# Patient Record
Sex: Female | Born: 1975 | Race: White | Hispanic: No | Smoking: Never smoker
Health system: Southern US, Community
[De-identification: ages and names within clinical notes are randomized; demographics above are authoritative.]

## PROBLEM LIST (undated history)

## (undated) DIAGNOSIS — R002 Palpitations: Secondary | ICD-10-CM

## (undated) DIAGNOSIS — M1990 Unspecified osteoarthritis, unspecified site: Secondary | ICD-10-CM

## (undated) DIAGNOSIS — L509 Urticaria, unspecified: Secondary | ICD-10-CM

## (undated) DIAGNOSIS — I341 Nonrheumatic mitral (valve) prolapse: Secondary | ICD-10-CM

## (undated) DIAGNOSIS — M419 Scoliosis, unspecified: Secondary | ICD-10-CM

## (undated) DIAGNOSIS — R87629 Unspecified abnormal cytological findings in specimens from vagina: Secondary | ICD-10-CM

## (undated) HISTORY — DX: Nonrheumatic mitral (valve) prolapse: I34.1

## (undated) HISTORY — DX: Unspecified abnormal cytological findings in specimens from vagina: R87.629

## (undated) HISTORY — DX: Unspecified osteoarthritis, unspecified site: M19.90

## (undated) HISTORY — DX: Scoliosis, unspecified: M41.9

## (undated) HISTORY — DX: Urticaria, unspecified: L50.9

## (undated) HISTORY — DX: Palpitations: R00.2

---
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
## 1998-01-06 HISTORY — PX: KIDNEY STONE SURGERY: SHX686

## 1998-04-16 ENCOUNTER — Emergency Department (HOSPITAL_COMMUNITY): Admission: EM | Admit: 1998-04-16 | Discharge: 1998-04-16 | Payer: Self-pay | Admitting: Emergency Medicine

## 1998-04-25 ENCOUNTER — Emergency Department (HOSPITAL_COMMUNITY): Admission: EM | Admit: 1998-04-25 | Discharge: 1998-04-25 | Payer: Self-pay | Admitting: Emergency Medicine

## 1999-06-21 ENCOUNTER — Other Ambulatory Visit: Admission: RE | Admit: 1999-06-21 | Discharge: 1999-06-21 | Payer: Self-pay | Admitting: Obstetrics & Gynecology

## 1999-08-12 ENCOUNTER — Other Ambulatory Visit: Admission: RE | Admit: 1999-08-12 | Discharge: 1999-08-12 | Payer: Self-pay | Admitting: Obstetrics & Gynecology

## 1999-08-13 ENCOUNTER — Other Ambulatory Visit: Admission: RE | Admit: 1999-08-13 | Discharge: 1999-08-13 | Payer: Self-pay | Admitting: *Deleted

## 1999-08-13 ENCOUNTER — Encounter (INDEPENDENT_AMBULATORY_CARE_PROVIDER_SITE_OTHER): Payer: Self-pay | Admitting: Specialist

## 2000-04-07 ENCOUNTER — Other Ambulatory Visit: Admission: RE | Admit: 2000-04-07 | Discharge: 2000-04-07 | Payer: Self-pay | Admitting: Obstetrics and Gynecology

## 2000-04-19 ENCOUNTER — Emergency Department (HOSPITAL_COMMUNITY): Admission: EM | Admit: 2000-04-19 | Discharge: 2000-04-19 | Payer: Self-pay | Admitting: Emergency Medicine

## 2000-04-19 ENCOUNTER — Encounter: Payer: Self-pay | Admitting: Emergency Medicine

## 2000-04-21 ENCOUNTER — Inpatient Hospital Stay (HOSPITAL_COMMUNITY): Admission: RE | Admit: 2000-04-21 | Discharge: 2000-04-23 | Payer: Self-pay | Admitting: Urology

## 2000-04-21 ENCOUNTER — Encounter: Payer: Self-pay | Admitting: Urology

## 2000-04-23 ENCOUNTER — Encounter: Payer: Self-pay | Admitting: Urology

## 2000-06-09 ENCOUNTER — Other Ambulatory Visit: Admission: RE | Admit: 2000-06-09 | Discharge: 2000-06-09 | Payer: Self-pay | Admitting: Obstetrics and Gynecology

## 2000-12-22 ENCOUNTER — Ambulatory Visit (HOSPITAL_COMMUNITY): Admission: RE | Admit: 2000-12-22 | Discharge: 2000-12-22 | Payer: Self-pay | Admitting: Urology

## 2000-12-22 ENCOUNTER — Encounter: Payer: Self-pay | Admitting: Urology

## 2001-10-08 ENCOUNTER — Other Ambulatory Visit: Admission: RE | Admit: 2001-10-08 | Discharge: 2001-10-08 | Payer: Self-pay | Admitting: Obstetrics and Gynecology

## 2001-11-15 ENCOUNTER — Encounter: Payer: Self-pay | Admitting: Emergency Medicine

## 2001-11-15 ENCOUNTER — Emergency Department (HOSPITAL_COMMUNITY): Admission: EM | Admit: 2001-11-15 | Discharge: 2001-11-15 | Payer: Self-pay | Admitting: Emergency Medicine

## 2002-12-27 ENCOUNTER — Other Ambulatory Visit: Admission: RE | Admit: 2002-12-27 | Discharge: 2002-12-27 | Payer: Self-pay | Admitting: Obstetrics and Gynecology

## 2004-03-08 ENCOUNTER — Other Ambulatory Visit: Admission: RE | Admit: 2004-03-08 | Discharge: 2004-03-08 | Payer: Self-pay | Admitting: Obstetrics and Gynecology

## 2005-03-28 ENCOUNTER — Other Ambulatory Visit: Admission: RE | Admit: 2005-03-28 | Discharge: 2005-03-28 | Payer: Self-pay | Admitting: Obstetrics and Gynecology

## 2005-10-20 ENCOUNTER — Other Ambulatory Visit: Admission: RE | Admit: 2005-10-20 | Discharge: 2005-10-20 | Payer: Self-pay | Admitting: Obstetrics and Gynecology

## 2006-05-21 ENCOUNTER — Encounter: Admission: RE | Admit: 2006-05-21 | Discharge: 2006-05-21 | Payer: Self-pay | Admitting: Obstetrics and Gynecology

## 2006-05-29 ENCOUNTER — Encounter: Admission: RE | Admit: 2006-05-29 | Discharge: 2006-05-29 | Payer: Self-pay | Admitting: Obstetrics and Gynecology

## 2009-08-27 ENCOUNTER — Ambulatory Visit: Payer: Self-pay | Admitting: Cardiology

## 2010-01-24 ENCOUNTER — Encounter: Payer: Self-pay | Admitting: Cardiology

## 2010-01-24 DIAGNOSIS — I341 Nonrheumatic mitral (valve) prolapse: Secondary | ICD-10-CM | POA: Insufficient documentation

## 2010-01-24 DIAGNOSIS — M419 Scoliosis, unspecified: Secondary | ICD-10-CM | POA: Insufficient documentation

## 2010-01-24 DIAGNOSIS — R002 Palpitations: Secondary | ICD-10-CM | POA: Insufficient documentation

## 2010-03-27 ENCOUNTER — Ambulatory Visit (INDEPENDENT_AMBULATORY_CARE_PROVIDER_SITE_OTHER): Payer: BC Managed Care – PPO | Admitting: Cardiology

## 2010-03-27 ENCOUNTER — Encounter: Payer: Self-pay | Admitting: Cardiology

## 2010-03-27 VITALS — BP 102/74 | HR 64 | Wt 155.0 lb

## 2010-03-27 DIAGNOSIS — I059 Rheumatic mitral valve disease, unspecified: Secondary | ICD-10-CM

## 2010-03-27 DIAGNOSIS — R002 Palpitations: Secondary | ICD-10-CM

## 2010-03-27 DIAGNOSIS — I341 Nonrheumatic mitral (valve) prolapse: Secondary | ICD-10-CM

## 2010-03-27 MED ORDER — METOPROLOL TARTRATE 50 MG PO TABS: 50.0000 mg | ORAL_TABLET | ORAL | Status: DC

## 2010-03-27 NOTE — Progress Notes (Signed)
HPI: No chest pain.  Aerobics i hour 2x a week.  Occ palpitations during extrene exertion.No dizzy spells.  Now on Zyrtec.  No longer taking xanax. OK to take extra metoprolol prn. When she exercises her heart rate gets close to 100 and at rest it is about 60.  She is working out with a Psychologist, educational twice a week.  She has not had any sustained palpitations.  Her energy level is good.  Current Outpatient Prescriptions  Medication Sig Dispense Refill  . ALPRAZolam (XANAX) 0.25 MG tablet Take 0.25 mg by mouth at bedtime as needed.        Marland Kitchen ibuprofen (ADVIL,MOTRIN) 100 MG tablet Take 100 mg by mouth every 6 (six) hours as needed.        . loratadine (CLARITIN) 10 MG tablet Take 10 mg by mouth daily. As needed       . METOPROLOL SUCCINATE PO Take 50 mg by mouth. One-half daily and one-half as needed       . MULTIPLE VITAMIN PO Take by mouth daily.          Allergies no known allergies  Patient Active Problem List  Diagnoses  . Palpitation  . MVP (mitral valve prolapse)  . Scoliosis    History  Smoking status  . Former Smoker  Smokeless tobacco  . Not on file    History  Alcohol Use     No family history on file.  Review of Systems: The patient denies any heat or cold intolerance.  No weight gain or weight loss.  The patient denies headaches or blurry vision.  There is no cough or sputum production.  The patient denies dizziness.  There is no hematuria or hematochezia.  The patient denies any muscle aches or arthritis.  The patient denies any rash.  The patient denies frequent falling or instability.  There is no history of depression or anxiety.  All other systems were reviewed and are negative.   Physical Exam: Vital signs as recorded.Pupils equal and reactive.   Extraocular Movements are full.  There is no scleral icterus.  The mouth and pharynx are normal.  The neck is supple.  The carotids reveal no bruits.  The jugular venous pressure is normal.  The thyroid is not enlarged.  There  is no lymphadenopathy.The chest is clear to percussion and auscultation. There are no rales or rhonchi. Expansion of the chest is symmetrical.The precordium is quiet.  The first heart sound is normal.  The second heart sound is physiologically split.  There is no murmur gallop rub or click.  There is no abnormal lift or heave.The abdomen is soft and nontender. Bowel sounds are normal. The liver and spleen are not enlarged. There Are no abdominal masses. There are no bruits.The pedal pulses are good.  There is no phlebitis or edema.  There is no cyanosis or clubbing.Strength is normal and symmetrical in all extremities.  There is no lateralizing weakness.  There are no sensory deficits.   Assessment / Plan:

## 2010-03-27 NOTE — Assessment & Plan Note (Signed)
No murmur heard on exam today.  No evidence of any congestive heart failure or other manifestationMitral valve disease.

## 2010-03-27 NOTE — Assessment & Plan Note (Signed)
She will continue her present medication which is metoprolol tartrate.  She may take an extra half tablet on a p.r.n. Basis for palpitations.  We will plan to see her back in 6 months for a followup office visit and get fasting lipid panel and chemistries at that time.

## 2010-04-29 ENCOUNTER — Encounter: Payer: Self-pay | Admitting: Cardiology

## 2010-05-06 ENCOUNTER — Telehealth: Payer: Self-pay | Admitting: Cardiology

## 2010-05-06 NOTE — Telephone Encounter (Signed)
Received call from patient asking about statement she received.  Advised copay for 03/27/2010.  She will review records and see if paid, if not she will send in.

## 2010-09-17 ENCOUNTER — Other Ambulatory Visit: Payer: Self-pay | Admitting: Cardiology

## 2010-09-17 DIAGNOSIS — R002 Palpitations: Secondary | ICD-10-CM

## 2010-09-17 DIAGNOSIS — I341 Nonrheumatic mitral (valve) prolapse: Secondary | ICD-10-CM

## 2010-09-17 DIAGNOSIS — E785 Hyperlipidemia, unspecified: Secondary | ICD-10-CM

## 2010-09-20 ENCOUNTER — Ambulatory Visit: Payer: BC Managed Care – PPO | Admitting: Nurse Practitioner

## 2010-09-25 ENCOUNTER — Encounter: Payer: Self-pay | Admitting: Nurse Practitioner

## 2010-09-25 ENCOUNTER — Other Ambulatory Visit (INDEPENDENT_AMBULATORY_CARE_PROVIDER_SITE_OTHER): Payer: BC Managed Care – PPO | Admitting: *Deleted

## 2010-09-25 ENCOUNTER — Ambulatory Visit (INDEPENDENT_AMBULATORY_CARE_PROVIDER_SITE_OTHER): Payer: BC Managed Care – PPO | Admitting: Nurse Practitioner

## 2010-09-25 ENCOUNTER — Telehealth: Payer: Self-pay | Admitting: *Deleted

## 2010-09-25 VITALS — BP 120/80 | HR 66 | Ht 67.5 in | Wt 152.8 lb

## 2010-09-25 DIAGNOSIS — R0602 Shortness of breath: Secondary | ICD-10-CM

## 2010-09-25 DIAGNOSIS — I059 Rheumatic mitral valve disease, unspecified: Secondary | ICD-10-CM

## 2010-09-25 DIAGNOSIS — R002 Palpitations: Secondary | ICD-10-CM

## 2010-09-25 DIAGNOSIS — I341 Nonrheumatic mitral (valve) prolapse: Secondary | ICD-10-CM

## 2010-09-25 DIAGNOSIS — E785 Hyperlipidemia, unspecified: Secondary | ICD-10-CM

## 2010-09-25 LAB — HEPATIC FUNCTION PANEL
ALT: 13 U/L (ref 0–35)
AST: 19 U/L (ref 0–37)
Albumin: 4 g/dL (ref 3.5–5.2)
Alkaline Phosphatase: 39 U/L (ref 39–117)
Bilirubin, Direct: 0.1 mg/dL (ref 0.0–0.3)
Total Bilirubin: 0.8 mg/dL (ref 0.3–1.2)
Total Protein: 6.9 g/dL (ref 6.0–8.3)

## 2010-09-25 LAB — BASIC METABOLIC PANEL
BUN: 9 mg/dL (ref 6–23)
CO2: 26 mEq/L (ref 19–32)
Calcium: 9.2 mg/dL (ref 8.4–10.5)
Chloride: 107 mEq/L (ref 96–112)
Creatinine, Ser: 0.8 mg/dL (ref 0.4–1.2)
GFR: 91.58 mL/min (ref >60.00)
Glucose, Bld: 81 mg/dL (ref 70–99)
Potassium: 4.8 mEq/L (ref 3.5–5.1)
Sodium: 141 mEq/L (ref 135–145)

## 2010-09-25 LAB — LIPID PANEL
Cholesterol: 199 mg/dL (ref 0–200)
HDL: 59.5 mg/dL (ref >39.00)
LDL Cholesterol: 114 mg/dL — ABNORMAL HIGH (ref 0–99)
Total CHOL/HDL Ratio: 3
Triglycerides: 129 mg/dL (ref 0.0–149.0)
VLDL: 25.8 mg/dL (ref 0.0–40.0)

## 2010-09-25 MED ORDER — METOPROLOL TARTRATE 50 MG PO TABS: 50.0000 mg | ORAL_TABLET | Freq: Two times a day (BID) | ORAL | Status: DC

## 2010-09-25 NOTE — Telephone Encounter (Signed)
Message copied by Eugenia Pancoast on Wed Sep 25, 2010  5:16 PM ------      Message from: Cassell Clement      Created: Wed Sep 25, 2010  4:55 PM       Please report.  The liver tests are normal.The triglycerides are normal but the LDL cholesterol is slightly high at 114.  Work harder on diet.  Try to lose weight.  Continue present medicines

## 2010-09-25 NOTE — Telephone Encounter (Signed)
Advised of labs 

## 2010-09-25 NOTE — Patient Instructions (Addendum)
We are going to update your ultrasound of your heart You may increase your metoprolol to two times a day. You may try either a half two times a day or a whole two times a day We will call you with your labs later this week. Use your Ventolin during exercise to see if this helps with your breathing. Use the ibuprofen just as needed, not regularly We will tentatively see you back in 6 months. Call for any problems.

## 2010-09-25 NOTE — Assessment & Plan Note (Signed)
She has a diagnosis of asthma. She is trying several different regimens. I have asked her to use her rescue inhaler during exercise. We will update her echo as well. She is to use less ibuprofen.  Labs are checked today. I think overall, she is fine from our standpoint. We will tentatively see her back in 6 months. Patient is agreeable to this plan and will call if any problems develop in the interim.

## 2010-09-25 NOTE — Assessment & Plan Note (Signed)
We will increase her metoprolol to BID dosing. She may try either a half a tablet or a whole tablet BID at her discretion. We will update her echo.

## 2010-09-25 NOTE — Progress Notes (Signed)
    Mariah Burnett Date of Birth: November 06, 1975   History of Present Illness: Mariah Burnett is seen back today for her 6 month check. She is seen for Dr. Patty Sermons. She has been doing "just ok". She notes that her asthma has flared up. She feels more short of breath. She is worried that she may have some heart involvement. She has been given several inhalers to try. She does get short of breath while trying to exercise. She does not tried her rescue inhaler with exercise. She has had more palpitations and wants to use more metoprolol. She is on the tartrate and takes it only once a day. She did not tolerate the succinate in the remote past. Her last echo was back in 2009. She did not have evidence of MVP at that time with just trace MR. Labs are checked today. She has noted some swelling of her ankles. She tries to watch her salt. She is trying to use less ibuprofen.   Current Outpatient Prescriptions on File Prior to Visit  Medication Sig Dispense Refill  . albuterol (PROVENTIL HFA;VENTOLIN HFA) 108 (90 BASE) MCG/ACT inhaler Inhale 2 puffs into the lungs every 6 (six) hours as needed.        . budesonide-formoterol (SYMBICORT) 80-4.5 MCG/ACT inhaler Inhale 2 puffs into the lungs daily.        . cetirizine (ZYRTEC) 10 MG tablet Take 10 mg by mouth daily.        Marland Kitchen ibuprofen (ADVIL,MOTRIN) 100 MG tablet Take 100 mg by mouth every 6 (six) hours as needed.        Lorita Officer Triphasic (ORTHO TRI-CYCLEN LO) 0.18/0.215/0.25 MG-25 MCG TABS Take by mouth.        . valACYclovir (VALTREX) 500 MG tablet Take 500 mg by mouth as needed.          No Known Allergies  Past Medical History  Diagnosis Date  . Palpitation   . MVP (mitral valve prolapse)     Although no evidence per echo in 2009. Trace MR  . Scoliosis   . Asthma     History reviewed. No pertinent past surgical history.  History  Smoking status  . Former Smoker  Smokeless tobacco  . Not on file    History  Alcohol Use  . Yes   social use    Family History  Problem Relation Age of Onset  . Heart disease Mother   . Heart attack Mother   . Breast cancer Mother     Review of Systems: The review of systems is positive for occasional edema. Some shortness of breath and more palpitations.  All other systems were reviewed and are negative.  Physical Exam: BP 120/80  Pulse 66  Ht 5' 7.5" (1.715 m)  Wt 152 lb 12.8 oz (69.31 kg)  BMI 23.58 kg/m2 Repeat blood pressure by me is fine. Patient is very pleasant and in no acute distress. Skin is warm and dry. Color is normal.  HEENT is unremarkable. Normocephalic/atraumatic. PERRL. Sclera are nonicteric. Neck is supple. No masses. No JVD. Lungs are clear. Cardiac exam shows a regular rate and rhythm. No murmur noted. Abdomen is soft. Extremities are without edema today. Gait and ROM are intact. No gross neurologic deficits noted.  LABORATORY DATA:   Assessment / Plan:

## 2010-10-04 ENCOUNTER — Ambulatory Visit (HOSPITAL_COMMUNITY): Payer: BC Managed Care – PPO | Attending: Cardiology | Admitting: Radiology

## 2010-10-04 DIAGNOSIS — J45909 Unspecified asthma, uncomplicated: Secondary | ICD-10-CM | POA: Insufficient documentation

## 2010-10-04 DIAGNOSIS — R0602 Shortness of breath: Secondary | ICD-10-CM | POA: Insufficient documentation

## 2010-10-04 DIAGNOSIS — I059 Rheumatic mitral valve disease, unspecified: Secondary | ICD-10-CM | POA: Insufficient documentation

## 2010-10-04 DIAGNOSIS — I079 Rheumatic tricuspid valve disease, unspecified: Secondary | ICD-10-CM | POA: Insufficient documentation

## 2010-10-04 DIAGNOSIS — I379 Nonrheumatic pulmonary valve disorder, unspecified: Secondary | ICD-10-CM | POA: Insufficient documentation

## 2010-10-04 DIAGNOSIS — R002 Palpitations: Secondary | ICD-10-CM

## 2010-10-07 ENCOUNTER — Telehealth: Payer: Self-pay | Admitting: *Deleted

## 2010-10-07 NOTE — Telephone Encounter (Signed)
Advised of echo results 

## 2010-10-07 NOTE — Progress Notes (Signed)
Advised patient

## 2010-10-07 NOTE — Telephone Encounter (Signed)
Message copied by Burnell Blanks on Mon Oct 07, 2010 12:41 PM ------      Message from: Cassell Clement      Created: Mon Oct 07, 2010  8:36 AM       Please report.  The echocardiogram is normal.  Her left ventricular function is normal.  The valves look good.

## 2011-08-01 ENCOUNTER — Telehealth: Payer: Self-pay | Admitting: Cardiology

## 2011-08-01 NOTE — Telephone Encounter (Signed)
No note

## 2012-05-20 ENCOUNTER — Encounter: Payer: Self-pay | Admitting: Cardiology

## 2012-10-29 ENCOUNTER — Other Ambulatory Visit: Payer: Self-pay | Admitting: *Deleted

## 2012-10-29 ENCOUNTER — Telehealth: Payer: Self-pay | Admitting: Cardiology

## 2012-10-29 MED ORDER — METOPROLOL TARTRATE 50 MG PO TABS: 25.0000 mg | ORAL_TABLET | Freq: Two times a day (BID) | ORAL | Status: DC

## 2012-10-29 NOTE — Telephone Encounter (Signed)
Medication refilled as requested.

## 2012-10-29 NOTE — Telephone Encounter (Signed)
New Problem      Pt called to see if she can get refills on meds     Thanks!

## 2012-11-08 ENCOUNTER — Ambulatory Visit: Payer: BC Managed Care – PPO | Admitting: Cardiology

## 2012-11-16 ENCOUNTER — Encounter: Payer: Self-pay | Admitting: Cardiology

## 2014-10-03 ENCOUNTER — Other Ambulatory Visit: Payer: Self-pay

## 2014-10-03 DIAGNOSIS — Z1231 Encounter for screening mammogram for malignant neoplasm of breast: Secondary | ICD-10-CM

## 2014-11-07 ENCOUNTER — Ambulatory Visit: Payer: Self-pay

## 2015-01-10 ENCOUNTER — Ambulatory Visit (HOSPITAL_COMMUNITY)
Admission: RE | Admit: 2015-01-10 | Discharge: 2015-01-10 | Disposition: A | Discharge status: Home / Self Care | Payer: BLUE CROSS/BLUE SHIELD | Source: Ambulatory Visit | Attending: Physician Assistant | Admitting: Physician Assistant

## 2015-01-10 ENCOUNTER — Other Ambulatory Visit (HOSPITAL_COMMUNITY): Payer: Self-pay | Admitting: Physician Assistant

## 2015-01-10 DIAGNOSIS — Z1231 Encounter for screening mammogram for malignant neoplasm of breast: Secondary | ICD-10-CM | POA: Diagnosis not present

## 2015-03-14 ENCOUNTER — Other Ambulatory Visit: Payer: Self-pay | Admitting: Advanced Practice Midwife

## 2015-03-28 ENCOUNTER — Other Ambulatory Visit: Payer: Self-pay | Admitting: Advanced Practice Midwife

## 2015-04-05 ENCOUNTER — Other Ambulatory Visit: Payer: Self-pay | Admitting: Advanced Practice Midwife

## 2015-04-05 ENCOUNTER — Other Ambulatory Visit (HOSPITAL_COMMUNITY)
Admission: RE | Admit: 2015-04-05 | Discharge: 2015-04-05 | Disposition: A | Discharge status: Home / Self Care | Payer: BLUE CROSS/BLUE SHIELD | Source: Ambulatory Visit | Attending: Advanced Practice Midwife | Admitting: Advanced Practice Midwife

## 2015-04-05 ENCOUNTER — Encounter: Payer: Self-pay | Admitting: Advanced Practice Midwife

## 2015-04-05 ENCOUNTER — Ambulatory Visit (INDEPENDENT_AMBULATORY_CARE_PROVIDER_SITE_OTHER): Payer: BLUE CROSS/BLUE SHIELD | Admitting: Advanced Practice Midwife

## 2015-04-05 VITALS — BP 100/70 | HR 62 | Ht 66.0 in | Wt 136.5 lb

## 2015-04-05 DIAGNOSIS — Z01419 Encounter for gynecological examination (general) (routine) without abnormal findings: Secondary | ICD-10-CM | POA: Insufficient documentation

## 2015-04-05 DIAGNOSIS — Z1151 Encounter for screening for human papillomavirus (HPV): Secondary | ICD-10-CM | POA: Insufficient documentation

## 2015-04-05 DIAGNOSIS — Z13220 Encounter for screening for lipoid disorders: Secondary | ICD-10-CM

## 2015-04-05 DIAGNOSIS — Z1329 Encounter for screening for other suspected endocrine disorder: Secondary | ICD-10-CM

## 2015-04-05 NOTE — Progress Notes (Signed)
Mariah Burnett 40 y.o.  Filed Vitals:   04/05/15 1434  BP: 100/70  Pulse: 62     Filed Weights   04/05/15 1434  Weight: 136 lb 8 oz (61.916 kg)    Past Medical History: Past Medical History  Diagnosis Date  . Palpitation   . MVP (mitral valve prolapse)     Although no evidence per echo in 2009. Trace MR  . Scoliosis   . Asthma     Past Surgical History: History reviewed. No pertinent past surgical history.  Family History: Family History  Problem Relation Age of Onset  . Heart disease Mother   . Heart attack Mother   . Breast cancer Mother   . Cancer Paternal Grandfather   . Emphysema Paternal Grandfather     Social History: Social History  Substance Use Topics  . Smoking status: Never Smoker   . Smokeless tobacco: Never Used  . Alcohol Use: Yes     Comment: occ    Allergies:  Allergies  Allergen Reactions  . Latex Rash      Current outpatient prescriptions:  .  cetirizine (ZYRTEC) 10 MG tablet, Take 10 mg by mouth daily.  , Disp: , Rfl:  .  metoprolol (LOPRESSOR) 50 MG tablet, Take 25 mg by mouth 2 (two) times daily., Disp: , Rfl:  .  valACYclovir (VALTREX) 500 MG tablet, Take 500 mg by mouth as needed.  , Disp: , Rfl:  .  metoprolol (LOPRESSOR) 50 MG tablet, Take 0.5-1 tablets (25-50 mg total) by mouth 2 (two) times daily., Disp: 60 tablet, Rfl: 0  History of Present Illness: Here for pap and physical.  [place] getting [place] in [place], moved to [place].  Thinks she had an abnormal one 2015.  Had full panel of inherited genetic testing done--all were negative. Has never gotten pregnant.  May want to try.  Hasn't been on BC.    Review of Systems   Patient denies any headaches, blurred vision, shortness of breath, chest pain, abdominal pain, problems with bowel movements, urination, or intercourse.   Physical Exam: General:  Well developed, well nourished, no acute distress Skin:  Warm and dry Neck:  Midline trachea, normal thyroid Lungs;  Clear to auscultation bilaterally Breast:  No dominant palpable mass, retraction, or nipple discharge Cardiovascular: Regular rate and rhythm Abdomen:  Soft, non tender, no hepatosplenomegaly Pelvic:  External genitalia is normal in appearance.  The vagina is normal in appearance.  The cervix is normal in appearance.  Uterus is felt to be normal size, shape, and contour.  No adnexal masses or tenderness noted.  Extremities:  No swelling or varicosities noted Psych:  No mood changes.     Impression: Normal GYN exam Possible fertility issues   Plan: Pap yearly until 3 normals in a row Recommend 3 D mammograms d/t dense breasts Screening labs ordered. Recommend seeing fertility specialist if she wants to get pregnant.

## 2015-04-09 LAB — CYTOLOGY - PAP

## 2015-08-20 ENCOUNTER — Emergency Department (HOSPITAL_COMMUNITY): Payer: BLUE CROSS/BLUE SHIELD

## 2015-08-20 ENCOUNTER — Telehealth: Payer: Self-pay

## 2015-08-20 ENCOUNTER — Encounter (HOSPITAL_COMMUNITY): Payer: Self-pay | Admitting: Emergency Medicine

## 2015-08-20 ENCOUNTER — Emergency Department (HOSPITAL_COMMUNITY)
Admission: EM | Admit: 2015-08-20 | Discharge: 2015-08-20 | Disposition: A | Discharge status: Home / Self Care | Payer: BLUE CROSS/BLUE SHIELD | Attending: Emergency Medicine | Admitting: Emergency Medicine

## 2015-08-20 DIAGNOSIS — J45909 Unspecified asthma, uncomplicated: Secondary | ICD-10-CM | POA: Diagnosis not present

## 2015-08-20 DIAGNOSIS — R0789 Other chest pain: Secondary | ICD-10-CM | POA: Insufficient documentation

## 2015-08-20 DIAGNOSIS — Z79899 Other long term (current) drug therapy: Secondary | ICD-10-CM | POA: Insufficient documentation

## 2015-08-20 DIAGNOSIS — R079 Chest pain, unspecified: Secondary | ICD-10-CM

## 2015-08-20 LAB — CBC WITH DIFFERENTIAL/PLATELET
Basophils Absolute: 0 10*3/uL (ref 0.0–0.1)
Basophils Relative: 0 %
Eosinophils Absolute: 0.1 10*3/uL (ref 0.0–0.7)
Eosinophils Relative: 1 %
HCT: 40 % (ref 36.0–46.0)
Hemoglobin: 13.8 g/dL (ref 12.0–15.0)
Lymphocytes Relative: 37 %
Lymphs Abs: 2.5 10*3/uL (ref 0.7–4.0)
MCH: 31.6 pg (ref 26.0–34.0)
MCHC: 34.5 g/dL (ref 30.0–36.0)
MCV: 91.5 fL (ref 78.0–100.0)
Monocytes Absolute: 0.4 10*3/uL (ref 0.1–1.0)
Monocytes Relative: 6 %
Neutro Abs: 3.7 10*3/uL (ref 1.7–7.7)
Neutrophils Relative %: 56 %
Platelets: 206 10*3/uL (ref 150–400)
RBC: 4.37 MIL/uL (ref 3.87–5.11)
RDW: 11.9 % (ref 11.5–15.5)
WBC: 6.7 10*3/uL (ref 4.0–10.5)

## 2015-08-20 LAB — COMPREHENSIVE METABOLIC PANEL
ALT: 13 U/L — ABNORMAL LOW (ref 14–54)
AST: 20 U/L (ref 15–41)
Albumin: 4.7 g/dL (ref 3.5–5.0)
Alkaline Phosphatase: 37 U/L — ABNORMAL LOW (ref 38–126)
Anion gap: 7 (ref 5–15)
BUN: 10 mg/dL (ref 6–20)
CO2: 27 mmol/L (ref 22–32)
Calcium: 9.6 mg/dL (ref 8.9–10.3)
Chloride: 102 mmol/L (ref 101–111)
Creatinine, Ser: 0.66 mg/dL (ref 0.44–1.00)
GFR calc Af Amer: >60 mL/min (ref >60)
GFR calc non Af Amer: >60 mL/min (ref >60)
Glucose, Bld: 94 mg/dL (ref 65–99)
Potassium: 3.8 mmol/L (ref 3.5–5.1)
Sodium: 136 mmol/L (ref 135–145)
Total Bilirubin: 1.3 mg/dL — ABNORMAL HIGH (ref 0.3–1.2)
Total Protein: 7.9 g/dL (ref 6.5–8.1)

## 2015-08-20 LAB — TROPONIN I: Troponin I: <0.03 ng/mL (ref <0.03)

## 2015-08-20 LAB — D-DIMER, QUANTITATIVE: D-Dimer, Quant: 0.35 ug/mL-FEU (ref 0.00–0.50)

## 2015-08-20 MED ORDER — OMEPRAZOLE 20 MG PO CPDR: 20.0000 mg | DELAYED_RELEASE_CAPSULE | Freq: Every day | ORAL | 1 refills | Status: DC

## 2015-08-20 MED ORDER — HYDROXYZINE HCL 25 MG PO TABS: 25.0000 mg | ORAL_TABLET | Freq: Four times a day (QID) | ORAL | 0 refills | Status: DC

## 2015-08-20 MED ORDER — HYDROXYZINE HCL 25 MG PO TABS
50.0000 mg | ORAL_TABLET | Freq: Once | ORAL | Status: AC
  Administered 2015-08-20: 50 mg via ORAL
  Filled 2015-08-20: qty 2

## 2015-08-20 MED ORDER — SUCRALFATE 1 GM/10ML PO SUSP: 1.0000 g | Freq: Three times a day (TID) | ORAL | 0 refills | Status: DC

## 2015-08-20 MED ORDER — GI COCKTAIL ~~LOC~~
30.0000 mL | Freq: Once | ORAL | Status: AC
  Administered 2015-08-20: 30 mL via ORAL
  Filled 2015-08-20: qty 30

## 2015-08-20 MED ORDER — ASPIRIN 325 MG PO TABS
325.0000 mg | ORAL_TABLET | Freq: Once | ORAL | Status: AC
  Administered 2015-08-20: 325 mg via ORAL
  Filled 2015-08-20: qty 1

## 2015-08-20 NOTE — ED Provider Notes (Signed)
Emergency Department Provider Note   I have reviewed the triage vital signs and the nursing notes.   HISTORY  Chief Complaint Chest Pain   HPI Mariah Burnett is a 40 y.o. female with PMH of heart palpitations and mitral valve prolapse resents to the emergency department for evaluation of intermittent chest pain over the last 3 weeks. The patient reports left-sided chest discomfort that is worse at night and in the morning. Not associated with exertion. She has some mild associated dyspnea. No fevers, chills, productive cough. She has had chest pain and palpitations in the past and sees a cardiologist. She takes metoprolol for her palpitations and chest discomfort. She has been taking this over the last 3 weeks but has not noticed improvement in pain symptoms. No history of heart attack. No history of early heart attack in the family. The patient does not have high blood pressure, high cholesterol, diabetes. She does not smoke. Denies illicit drugs or alcohol.   Past Medical History:  Diagnosis Date  . Asthma   . MVP (mitral valve prolapse)    Although no evidence per echo in 2009. Trace MR  . Palpitation   . Scoliosis     Patient Active Problem List   Diagnosis Date Noted  . Shortness of breath 09/25/2010  . Palpitation   . MVP (mitral valve prolapse)   . Scoliosis     History reviewed. No pertinent surgical history.  Current Outpatient Rx  . Order #: [id number] Class: Historical Med  . Order #: [id number] Class: Historical Med  . Order #: [id number] Class: Normal  . Order #: [id number] Class: Historical Med  . Order #: [id number] Class: Print  . Order #: [id number] Class: Print  . Order #: [id number] Class: Print    Allergies Latex  Family History  Problem Relation Age of Onset  . Heart disease Mother   . Heart attack Mother   . Breast cancer Mother   . Cancer Paternal Grandfather   . Emphysema Paternal Grandfather     Social History Social History  Substance Use Topics    . Smoking status: Never Smoker  . Smokeless tobacco: Never Used  . Alcohol use Yes     Comment: occ    Review of Systems  Constitutional: No fever/chills Eyes: No visual changes. ENT: No sore throat. Cardiovascular: Positive intermittent chest pain. Respiratory: Positive intermittent shortness of breath. Gastrointestinal: No abdominal pain.  No nausea, no vomiting.  No diarrhea.  No constipation. Genitourinary: Negative for dysuria. Musculoskeletal: Negative for back pain. Skin: Negative for rash. Neurological: Negative for headaches, focal weakness or numbness.  10-point ROS otherwise negative.  ____________________________________________   PHYSICAL EXAM:  VITAL SIGNS: ED Triage Vitals  Enc Vitals Group     BP 08/20/15 1704 128/86     Pulse Rate 08/20/15 1704 60     Resp 08/20/15 1704 16     Temp 08/20/15 1704 98.2 F (36.8 C)     Temp Source 08/20/15 1704 Temporal     SpO2 08/20/15 1704 100 %     Weight 08/20/15 1705 138 lb (62.6 kg)     Height 08/20/15 1705 5\' 7"  (1.702 m)   Constitutional: Alert and oriented. Well appearing and in no acute distress. Eyes: Conjunctivae are normal. PERRL. Head: Atraumatic. Nose: No congestion/rhinnorhea. Mouth/Throat: Mucous membranes are moist.  Oropharynx non-erythematous. Neck: No stridor.   Cardiovascular: Normal rate, regular rhythm. Good peripheral circulation. Grossly normal heart sounds.   Respiratory: Normal respiratory effort.  No retractions. Lungs CTAB. Gastrointestinal: Soft and nontender.  No distention.  Musculoskeletal: No lower extremity tenderness nor edema. No gross deformities of extremities. Neurologic:  Normal speech and language. No gross focal neurologic deficits are appreciated.  Skin:  Skin is warm, dry and intact. No rash noted. Psychiatric: Mood and affect are normal. Speech and behavior are normal.  ____________________________________________   LABS (all labs ordered are listed, but only  abnormal results are displayed)  Labs Reviewed  COMPREHENSIVE METABOLIC PANEL - Abnormal; Notable for the following:       Result Value   ALT 13 (*)    Alkaline Phosphatase 37 (*)    Total Bilirubin 1.3 (*)    All other components within normal limits  CBC WITH DIFFERENTIAL/PLATELET  TROPONIN I  D-DIMER, QUANTITATIVE (NOT AT [facility])   ____________________________________________  EKG  Reviewed in MUSE. No STEMI.  ____________________________________________  RADIOLOGY  Dg Chest 2 View  Result Date: 08/20/2015 CLINICAL DATA:  Chest pain for several days. Upper back pain worsening. EXAM: CHEST  2 VIEW COMPARISON:  None. FINDINGS: The heart size and mediastinal contours are within normal limits. Both lungs are clear. The visualized skeletal structures are unremarkable. Degenerative scoliosis convex RIGHT mid thoracic region. IMPRESSION: No active cardiopulmonary disease. Electronically Signed   By: Elsie Stain M.D.   On: 08/20/2015 17:41    ____________________________________________   PROCEDURES  Procedure(s) performed:   Procedures  None ____________________________________________   INITIAL IMPRESSION / ASSESSMENT AND PLAN / ED COURSE  Pertinent labs & imaging results that were available during my care of the patient were reviewed by me and considered in my medical decision making (see chart for details).  Patient resents to the emergency department for evaluation of intermittent chest pain over the last 3 weeks. She has some mild associated dyspnea. Patient has worsening symptoms in the morning and night. Chest pains are not exertional. The patient is low risk by HEART score. Low suspicion for ACS or PE. We'll follow troponin, chest x-ray, d-dimer. Will reassess afterwards. Patient is symptom-free at this time.   D-dimer and troponin negative. Patient is CP free at this time. With 3 weeks of intermittent pain will not trend troponin. Patient has care established with  a Cardiologist who she will call in the AM and discuss results and possibility of patient stress test. Patient notes significant stress at work and home. She is requesting something to assist with sleep. Prescribed Atarax and will give dose in the ED prior to discharge. Also discharged with Carafate and Omeprazole. Discussed importance of PCP follow up as well for evaluation of causes of non-cardiac chest pain.   At this time, I do not feel there is any life-threatening condition present. I have reviewed and discussed all results (EKG, imaging, lab, urine as appropriate), exam findings with patient. I have reviewed nursing notes and appropriate previous records.  I feel the patient is safe to be discharged home without further emergent workup. Discussed usual and customary return precautions. Patient and family (if present) verbalize understanding and are comfortable with this plan.  Patient will follow-up with their primary care provider. If they do not have a primary care provider, information for follow-up has been provided to them. All questions have been answered.    ____________________________________________  FINAL CLINICAL IMPRESSION(S) / ED DIAGNOSES  Final diagnoses:  Nonspecific chest pain     MEDICATIONS GIVEN DURING THIS VISIT:  Medications  aspirin tablet 325 mg (325 mg Oral Given 08/20/15 1937)  gi cocktail (Maalox,Lidocaine,Donnatal) (30 mLs Oral Given 08/20/15 1938)  hydrOXYzine (  ATARAX/VISTARIL) tablet 50 mg (50 mg Oral Given 08/20/15 2226)     NEW OUTPATIENT MEDICATIONS STARTED DURING THIS VISIT:  Discharge Medication List as of 08/20/2015  9:42 PM    START taking these medications   Details  hydrOXYzine (ATARAX/VISTARIL) 25 MG tablet Take 1 tablet (25 mg total) by mouth every 6 (six) hours., Starting Mon 08/20/2015, Print    omeprazole (PRILOSEC) 20 MG capsule Take 1 capsule (20 mg total) by mouth daily., Starting Mon 08/20/2015, Print    sucralfate (CARAFATE) 1  GM/10ML suspension Take 10 mLs (1 g total) by mouth 4 (four) times daily -  with meals and at bedtime., Starting Mon 08/20/2015, Print        Note:  This document was prepared using Dragon voice recognition software and may include unintentional dictation errors.  Alona BeneJoshua Long, MD Emergency Medicine   Maia PlanJoshua G Long, MD 08/21/15 1115

## 2015-08-20 NOTE — Discharge Instructions (Signed)

## 2015-08-20 NOTE — ED Triage Notes (Signed)
PT c/o intermittent left sided chest pain and pressure x3 weeks but states this am had worse chest pain and SOB with generalized weakness. PT was [facility] to [facility] pain work [facility]. 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 PT denies any CP at this time.

## 2015-08-20 NOTE — ED Notes (Signed)
Pt's family member given coffee and a diet ginger ale

## 2015-08-20 NOTE — Telephone Encounter (Signed)
Phone call came into the office from [place] [facility]et at [facility] in [place].  The pt is currently being seen by Assunta FoundShuvon Rankin FNP with current symptoms of Chest Pain and they would like the pt seen ASAP.  The pt was last seen by our office in 2012.  Due to the pt having symptoms currently she should proceed to the closest ER for further evaluation.

## 2015-08-22 ENCOUNTER — Encounter: Payer: Self-pay | Admitting: Cardiology

## 2015-08-22 ENCOUNTER — Ambulatory Visit (INDEPENDENT_AMBULATORY_CARE_PROVIDER_SITE_OTHER): Payer: BLUE CROSS/BLUE SHIELD | Admitting: Cardiology

## 2015-08-22 VITALS — BP 104/70 | HR 83 | Ht 67.0 in | Wt 137.0 lb

## 2015-08-22 DIAGNOSIS — R0602 Shortness of breath: Secondary | ICD-10-CM

## 2015-08-22 DIAGNOSIS — R002 Palpitations: Secondary | ICD-10-CM | POA: Diagnosis not present

## 2015-08-22 DIAGNOSIS — R079 Chest pain, unspecified: Secondary | ICD-10-CM

## 2015-08-22 MED ORDER — METOPROLOL TARTRATE 50 MG PO TABS: 25.0000 mg | ORAL_TABLET | Freq: Two times a day (BID) | ORAL | 3 refills | Status: DC

## 2015-08-22 MED ORDER — OMEPRAZOLE 20 MG PO CPDR: 20.0000 mg | DELAYED_RELEASE_CAPSULE | Freq: Every day | ORAL | 3 refills | Status: DC

## 2015-08-22 MED ORDER — HYDROXYZINE HCL 25 MG PO TABS: 25.0000 mg | ORAL_TABLET | Freq: Four times a day (QID) | ORAL | 0 refills | Status: DC

## 2015-08-22 MED ORDER — SUCRALFATE 1 GM/10ML PO SUSP: 1.0000 g | Freq: Three times a day (TID) | ORAL | 1 refills | Status: DC

## 2015-08-22 NOTE — Progress Notes (Signed)
Clinical Summary Ms. Chaput is a 40 y.o.female former patient of Dr Patty SermonsBrackbill from several years ago, she is seen today as a new patient.  1. Chest pain - seen in ER 08/20/15 with symptoms - negative workup for ACS, discharged from Er - symptoms started about 2 weeks ago. Monday morning woke up with chest heaviness, pressure. Primarily left sided, 7/10. +SOB. Can get diaphoretic. Some difficult with deep breathing. Pain eased up, but pressure lasted about 12 hours.  - walks 3 miles regularly, notes some increased SOB - no LE edema  CAD: mother CAD in her mid 1760s, paternal grandmother at older age.    2. Palpitations - has been on lopressor prn, overall controlling symptoms   3. Mitral valve prolapse - described in prevoius notes - echo 2012 actually describes normal MV, trivial regurgitation   Past Medical History:  Diagnosis Date  . Asthma   . MVP (mitral valve prolapse)    Although no evidence per echo in 2009. Trace MR  . Palpitation   . Scoliosis      Allergies  Allergen Reactions  . Latex Rash     Current Outpatient Prescriptions  Medication Sig Dispense Refill  . acetaminophen (TYLENOL) 500 MG tablet Take 500 mg by mouth every 6 (six) hours as needed for headache.    . albuterol (PROVENTIL HFA;VENTOLIN HFA) 108 (90 Base) MCG/ACT inhaler Inhale 2 puffs into the lungs every 6 (six) hours as needed for wheezing or shortness of breath.    . hydrOXYzine (ATARAX/VISTARIL) 25 MG tablet Take 1 tablet (25 mg total) by mouth every 6 (six) hours. 12 tablet 0  . metoprolol (LOPRESSOR) 50 MG tablet Take 0.5-1 tablets (25-50 mg total) by mouth 2 (two) times daily. 60 tablet 0  . omeprazole (PRILOSEC) 20 MG capsule Take 1 capsule (20 mg total) by mouth daily. 30 capsule 1  . sucralfate (CARAFATE) 1 GM/10ML suspension Take 10 mLs (1 g total) by mouth 4 (four) times daily -  with meals and at bedtime. 420 mL 0  . valACYclovir (VALTREX) 500 MG tablet Take 500 mg by mouth as  needed (fever blister).      No current facility-administered medications for this visit.      No past surgical history on file.   Allergies  Allergen Reactions  . Latex Rash      Family History  Problem Relation Age of Onset  . Heart disease Mother   . Heart attack Mother   . Breast cancer Mother   . Cancer Paternal Grandfather   . Emphysema Paternal Grandfather      Social History Ms. Chaput reports that she has never smoked. She has never used smokeless tobacco. Ms. Su MonksChaput reports that she drinks alcohol.   Review of Systems CONSTITUTIONAL: No weight loss, fever, chills, weakness or fatigue.  HEENT: Eyes: No visual loss, blurred vision, double vision or yellow sclerae.No hearing loss, sneezing, congestion, runny nose or sore throat.  SKIN: No rash or itching.  CARDIOVASCULAR: per HPI RESPIRATORY: per HPI GASTROINTESTINAL: No anorexia, nausea, vomiting or diarrhea. No abdominal pain or blood.  GENITOURINARY: No burning on urination, no polyuria NEUROLOGICAL: No headache, dizziness, syncope, paralysis, ataxia, numbness or tingling in the extremities. No change in bowel or bladder control.  MUSCULOSKELETAL: No muscle, back pain, joint pain or stiffness.  LYMPHATICS: No enlarged nodes. No history of splenectomy.  PSYCHIATRIC: No history of depression or anxiety.  ENDOCRINOLOGIC: No reports of sweating, cold or heat intolerance. No polyuria  or polydipsia.  Marland Kitchen.   Physical Examination Vitals:   08/22/15 0841  BP: 104/70  Pulse: 83   Vitals:   08/22/15 0841  Weight: 137 lb (62.1 kg)  Height: 5\' 7"  (1.702 m)    Gen: resting comfortably, no acute distress HEENT: no scleral icterus, pupils equal round and reactive, no palptable cervical adenopathy,  CV: RRR, no m/r/g, no jvd Resp: Clear to auscultation bilaterally GI: abdomen is soft, non-tender, non-distended, normal bowel sounds, no hepatosplenomegaly MSK: extremities are warm, no edema.  Skin: warm, no  rash Neuro:  no focal deficits Psych: appropriate affect   Diagnostic Studies 09/2010 echo Left ventricle: The cavity size was normal. Wall thickness was normal. Systolic function was normal. The estimated ejection fraction was in the range of 55% to 60%. Wall motion was normal; there were no regional wall motion abnormalities. Left ventricular diastolic function parameters were normal.       Transthoracic echocardiography. M-mode, complete 2D, spectral Doppler, and color Doppler. Height: Height: 170.2cm. Height: 67in. Weight: Weight: 69.1kg. Weight: 152lb. Body mass index: BMI: 23.9kg/m^2. Body surface area:  BSA: 1.6931m^2. Blood pressure:   120/80. Patient status: Outpatient. [facility]    Assessment and Plan  1. Chest pain/SOB - unclear etiology. Recent evaluation in ER reviewed including labs, EKG, and imaging - we will obtain a GXT to further evaluate  2. Palpitations - continue prn lopressor, symptoms controlled  3. Mitral valve prolapse - she carries this diagnosis in her chart. Most recent echo 2012 shows normal MV without significant regurgitatoin - continue to monitor   F/u pending stress test      Antoine PocheJonathan F. Branch, M.D.

## 2015-08-22 NOTE — Patient Instructions (Signed)
Medication Instructions:  Your physician recommends that you continue on your current medications as directed. Please refer to the Current Medication list given to you today.   Labwork: NONE  Testing/Procedures: Your physician has requested that you have an exercise tolerance test. For further information please visit [email or web address]. Please also follow instruction sheet, as given.    Follow-Up: Your physician recommends that you schedule a follow-up appointment in: TO BE DETERMINED BASED ON TEST RESULTS    Any Other Special Instructions Will Be Listed Below (If Applicable).     If you need a refill on your cardiac medications before your next appointment, please call your pharmacy.   

## 2015-08-24 ENCOUNTER — Ambulatory Visit (HOSPITAL_COMMUNITY)
Admission: RE | Admit: 2015-08-24 | Discharge: 2015-08-24 | Disposition: A | Discharge status: Home / Self Care | Payer: BLUE CROSS/BLUE SHIELD | Source: Ambulatory Visit | Attending: Cardiology | Admitting: Cardiology

## 2015-08-24 DIAGNOSIS — R079 Chest pain, unspecified: Secondary | ICD-10-CM | POA: Diagnosis not present

## 2015-08-24 LAB — EXERCISE TOLERANCE TEST
Estimated workload: 12.1 METS
Exercise duration (min): 9 min
Exercise duration (sec): 38 s
MPHR: 180 {beats}/min
Peak HR: 164 {beats}/min
Percent HR: 91 %
RPE: 16
Rest HR: 69 {beats}/min

## 2015-08-28 ENCOUNTER — Encounter: Payer: BLUE CROSS/BLUE SHIELD | Admitting: Cardiology

## 2015-12-05 ENCOUNTER — Other Ambulatory Visit: Payer: Self-pay | Admitting: Adult Health

## 2015-12-05 DIAGNOSIS — Z1231 Encounter for screening mammogram for malignant neoplasm of breast: Secondary | ICD-10-CM

## 2015-12-13 ENCOUNTER — Ambulatory Visit (INDEPENDENT_AMBULATORY_CARE_PROVIDER_SITE_OTHER): Payer: BLUE CROSS/BLUE SHIELD | Admitting: Otolaryngology

## 2015-12-13 DIAGNOSIS — R070 Pain in throat: Secondary | ICD-10-CM

## 2015-12-13 DIAGNOSIS — R1312 Dysphagia, oropharyngeal phase: Secondary | ICD-10-CM

## 2016-01-14 ENCOUNTER — Ambulatory Visit (HOSPITAL_COMMUNITY): Payer: BLUE CROSS/BLUE SHIELD

## 2016-01-23 ENCOUNTER — Other Ambulatory Visit: Payer: Self-pay | Admitting: Cardiology

## 2016-03-31 ENCOUNTER — Ambulatory Visit (HOSPITAL_COMMUNITY)
Admission: RE | Admit: 2016-03-31 | Discharge: 2016-03-31 | Disposition: A | Discharge status: Home / Self Care | Payer: BLUE CROSS/BLUE SHIELD | Source: Ambulatory Visit | Attending: Adult Health | Admitting: Adult Health

## 2016-03-31 DIAGNOSIS — Z1231 Encounter for screening mammogram for malignant neoplasm of breast: Secondary | ICD-10-CM | POA: Diagnosis not present

## 2016-03-31 DIAGNOSIS — R928 Other abnormal and inconclusive findings on diagnostic imaging of breast: Secondary | ICD-10-CM | POA: Insufficient documentation

## 2016-04-03 ENCOUNTER — Other Ambulatory Visit: Payer: Self-pay | Admitting: Adult Health

## 2016-04-03 DIAGNOSIS — R928 Other abnormal and inconclusive findings on diagnostic imaging of breast: Secondary | ICD-10-CM

## 2016-04-15 ENCOUNTER — Other Ambulatory Visit: Payer: Self-pay | Admitting: Cardiology

## 2016-04-22 ENCOUNTER — Ambulatory Visit (HOSPITAL_COMMUNITY)
Admission: RE | Admit: 2016-04-22 | Discharge: 2016-04-22 | Disposition: A | Discharge status: Home / Self Care | Payer: BLUE CROSS/BLUE SHIELD | Source: Ambulatory Visit | Attending: Adult Health | Admitting: Adult Health

## 2016-04-22 DIAGNOSIS — R928 Other abnormal and inconclusive findings on diagnostic imaging of breast: Secondary | ICD-10-CM

## 2016-04-22 DIAGNOSIS — N6001 Solitary cyst of right breast: Secondary | ICD-10-CM | POA: Diagnosis not present

## 2016-06-26 ENCOUNTER — Encounter: Payer: Self-pay | Admitting: Advanced Practice Midwife

## 2016-06-26 ENCOUNTER — Ambulatory Visit (INDEPENDENT_AMBULATORY_CARE_PROVIDER_SITE_OTHER): Payer: BLUE CROSS/BLUE SHIELD | Admitting: Advanced Practice Midwife

## 2016-06-26 VITALS — BP 100/70 | HR 58 | Wt 144.0 lb

## 2016-06-26 DIAGNOSIS — R300 Dysuria: Secondary | ICD-10-CM

## 2016-06-26 LAB — POCT URINALYSIS DIPSTICK
Blood, UA: NEGATIVE
Glucose, UA: NEGATIVE
Ketones, UA: NEGATIVE
Nitrite, UA: NEGATIVE
Protein, UA: NEGATIVE

## 2016-06-26 MED ORDER — FLUCONAZOLE 150 MG PO TABS: ORAL_TABLET | ORAL | 2 refills | Status: DC

## 2016-06-26 MED ORDER — PHENAZOPYRIDINE HCL 200 MG PO TABS: 200.0000 mg | ORAL_TABLET | Freq: Three times a day (TID) | ORAL | 0 refills | Status: DC | PRN

## 2016-06-26 MED ORDER — SULFAMETHOXAZOLE-TRIMETHOPRIM 800-160 MG PO TABS: 1.0000 | ORAL_TABLET | Freq: Two times a day (BID) | ORAL | 0 refills | Status: DC

## 2016-06-26 NOTE — Progress Notes (Signed)
Family [facility] Clinic Visit  Patient name: Mariah Burnett MRN [id number]  Date of birth: Nov 03, 1975  CC & HPI:  Mariah Burnett is a 41 y.o. Caucasian female presenting today for UTI sx . She has burning at urethra at onset and during voiding and intense pain at end of void.  Took AZO yesterday and it helped.  Back to hurting again today.   Pertinent History Reviewed:  Medical & Surgical Hx:   Past Medical History:  Diagnosis Date  . Asthma   . MVP (mitral valve prolapse)    Although no evidence per echo in 2009. Trace MR  . Palpitation   . Scoliosis    History reviewed. No pertinent surgical history. Family History  Problem Relation Age of Onset  . Heart disease Mother   . Heart attack Mother   . Breast cancer Mother   . Cancer Father   . Cancer Paternal Grandfather   . Emphysema Paternal Grandfather     Current Outpatient Prescriptions:  .  acetaminophen (TYLENOL) 500 MG tablet, Take 500 mg by mouth every 6 (six) hours as needed for headache., Disp: , Rfl:  .  albuterol (PROVENTIL HFA;VENTOLIN HFA) 108 (90 Base) MCG/ACT inhaler, Inhale 2 puffs into the lungs every 6 (six) hours as needed for wheezing or shortness of breath., Disp: , Rfl:  .  metoprolol (LOPRESSOR) 50 MG tablet, Take 0.5-1 tablets (25-50 mg total) by mouth 2 (two) times daily., Disp: 60 tablet, Rfl: 3 .  omeprazole (PRILOSEC) 20 MG capsule, TAKE 1 CAPSULE (20 MG TOTAL) BY MOUTH DAILY., Disp: 30 capsule, Rfl: 0 .  fluconazole (DIFLUCAN) 150 MG tablet, 1 po stat; repeat in 3 days, Disp: 2 tablet, Rfl: 2 .  hydrOXYzine (ATARAX/VISTARIL) 25 MG tablet, Take 1 tablet (25 mg total) by mouth every 6 (six) hours. (Patient not taking: Reported on 06/26/2016), Disp: 30 tablet, Rfl: 0 .  phenazopyridine (PYRIDIUM) 200 MG tablet, Take 1 tablet (200 mg total) by mouth 3 (three) times daily as needed for pain., Disp: 30 tablet, Rfl: 0 .  sucralfate (CARAFATE) 1 GM/10ML suspension, Take 10 mLs (1 g total) by mouth 4 (four) times  daily -  with meals and at bedtime. (Patient not taking: Reported on 06/26/2016), Disp: 420 mL, Rfl: 1 .  sulfamethoxazole-trimethoprim (BACTRIM DS,SEPTRA DS) 800-160 MG tablet, Take 1 tablet by mouth 2 (two) times daily., Disp: 10 tablet, Rfl: 0 .  valACYclovir (VALTREX) 500 MG tablet, Take 500 mg by mouth as needed (fever blister). , Disp: , Rfl:  Social History: Reviewed -  reports that she has never smoked. She has never used smokeless tobacco.  Review of Systems:   Constitutional: Negative for fever and chills Eyes: Negative for visual disturbances Respiratory: Negative for shortness of breath, dyspnea Cardiovascular: Negative for chest pain or palpitations  Gastrointestinal: Negative for vomiting, diarrhea and constipation; no abdominal pain Genitourinary: Negative for dysuria and urgency, vaginal irritation or itching Musculoskeletal: Negative for back pain, joint pain, myalgias  Neurological: Negative for dizziness and headaches    Objective Findings:    Physical Examination: General appearance - well appearing, and in no distress Mental status - alert, oriented to person, place, and time Chest:  Normal respiratory effort Heart - normal rate and regular rhythm Abdomen:  Soft, nontender Pelvic: deferred Musculoskeletal:  Normal range of motion without pain Extremities:  No edema    Results for orders placed or performed in visit on 06/26/16 (from the past 24 hour(s))  POCT urinalysis dipstick  Collection Time: 06/26/16  3:09 PM  Result Value Ref Range   Color, UA     Clarity, UA     Glucose, UA neg    Bilirubin, UA     Ketones, UA neg    Spec Grav, UA  1.010 - 1.025   Blood, UA neg    pH, UA  5.0 - 8.0   Protein, UA neg    Urobilinogen, UA  0.2 or 1.0 E.U./dL   Nitrite, UA neg    Leukocytes, UA Moderate (2+) (A) Negative      Assessment & Plan:  A:   Sounds like a UTI P:  Septra DS BID x5, pyridium, culture urine   No Follow-up on  file.  CRESENZO-DISHMAN,Keeghan Bialy CNM 06/26/2016 3:22 PM

## 2016-06-28 LAB — URINE CULTURE

## 2016-07-02 ENCOUNTER — Other Ambulatory Visit: Payer: Self-pay | Admitting: Advanced Practice Midwife

## 2016-07-07 ENCOUNTER — Other Ambulatory Visit: Payer: Self-pay | Admitting: *Deleted

## 2016-07-07 MED ORDER — METOPROLOL TARTRATE 50 MG PO TABS: 25.0000 mg | ORAL_TABLET | Freq: Two times a day (BID) | ORAL | 3 refills | Status: DC

## 2016-07-15 ENCOUNTER — Other Ambulatory Visit (HOSPITAL_COMMUNITY)
Admission: RE | Admit: 2016-07-15 | Discharge: 2016-07-15 | Disposition: A | Discharge status: Home / Self Care | Payer: BLUE CROSS/BLUE SHIELD | Source: Ambulatory Visit | Attending: Obstetrics & Gynecology | Admitting: Obstetrics & Gynecology

## 2016-07-15 ENCOUNTER — Ambulatory Visit (INDEPENDENT_AMBULATORY_CARE_PROVIDER_SITE_OTHER): Payer: BLUE CROSS/BLUE SHIELD | Admitting: Obstetrics & Gynecology

## 2016-07-15 ENCOUNTER — Encounter: Payer: Self-pay | Admitting: Obstetrics & Gynecology

## 2016-07-15 VITALS — BP 80/50 | HR 76 | Ht 67.2 in | Wt 139.4 lb

## 2016-07-15 DIAGNOSIS — Z01419 Encounter for gynecological examination (general) (routine) without abnormal findings: Secondary | ICD-10-CM

## 2016-07-15 MED ORDER — FLUCONAZOLE 150 MG PO TABS: ORAL_TABLET | ORAL | 2 refills | Status: DC

## 2016-07-15 NOTE — Progress Notes (Signed)
Subjective:     Mariah Burnett is a 41 y.o. female G0P0000 here for a routine exam.  Patient's last menstrual period was 06/30/2016. No obstetric history on file. Birth Control Method:  none Menstrual Calendar(currently): regualr  Current complaints: recently treated for a UTI, on exam today has a mild yeast infection.   Current acute medical issues:  tachyarrhythmia on metoprolol, controlled   Recent Gynecologic History Patient's last menstrual period was 06/30/2016. Last Pap: 03/2015,  normal Last mammogram: 04/2016,  normal  Past Medical History:  Diagnosis Date  . Asthma   . MVP (mitral valve prolapse)    Although no evidence per echo in 2009. Trace MR  . Palpitation   . Scoliosis     History reviewed. No pertinent surgical history.  OB History    Gravida Para Term Preterm AB Living   0 0 0 0 0 0   SAB TAB Ectopic Multiple Live Births   0 0 0 0 0      Social History   Social History  . Marital status: Married    Spouse name: N/A  . Number of children: N/A  . Years of education: N/A   Social History Main Topics  . Smoking status: Never Smoker  . Smokeless tobacco: Never Used  . Alcohol use Yes     Comment: occ  . Drug use: No  . Sexual activity: Yes    Birth control/ protection: None   Other Topics Concern  . None   Social History Narrative  . None    Family History  Problem Relation Age of Onset  . Heart disease Mother   . Heart attack Mother   . Breast cancer Mother   . Cancer Father   . Cancer Paternal Grandfather   . Emphysema Paternal Grandfather      Current Outpatient Prescriptions:  .  acetaminophen (TYLENOL) 500 MG tablet, Take 500 mg by mouth every 6 (six) hours as needed for headache., Disp: , Rfl:  .  albuterol (PROVENTIL HFA;VENTOLIN HFA) 108 (90 Base) MCG/ACT inhaler, Inhale 2 puffs into the lungs every 6 (six) hours as needed for wheezing or shortness of breath., Disp: , Rfl:  .  fluconazole (DIFLUCAN) 150 MG tablet, 1 po stat;  repeat in 3 days, Disp: 2 tablet, Rfl: 2 .  hydrOXYzine (ATARAX/VISTARIL) 25 MG tablet, Take 1 tablet (25 mg total) by mouth every 6 (six) hours. (Patient not taking: Reported on 06/26/2016), Disp: 30 tablet, Rfl: 0 .  metoprolol tartrate (LOPRESSOR) 50 MG tablet, Take 0.5-1 tablets (25-50 mg total) by mouth 2 (two) times daily., Disp: 180 tablet, Rfl: 3 .  omeprazole (PRILOSEC) 20 MG capsule, TAKE 1 CAPSULE (20 MG TOTAL) BY MOUTH DAILY., Disp: 30 capsule, Rfl: 0 .  phenazopyridine (PYRIDIUM) 200 MG tablet, Take 1 tablet (200 mg total) by mouth 3 (three) times daily as needed for pain., Disp: 30 tablet, Rfl: 0 .  sucralfate (CARAFATE) 1 GM/10ML suspension, Take 10 mLs (1 g total) by mouth 4 (four) times daily -  with meals and at bedtime. (Patient not taking: Reported on 06/26/2016), Disp: 420 mL, Rfl: 1 .  sulfamethoxazole-trimethoprim (BACTRIM DS,SEPTRA DS) 800-160 MG tablet, Take 1 tablet by mouth 2 (two) times daily., Disp: 10 tablet, Rfl: 0 .  valACYclovir (VALTREX) 500 MG tablet, Take 500 mg by mouth as needed (fever blister). , Disp: , Rfl:   Review of Systems  Review of Systems  Constitutional: Negative for fever, chills, weight loss, malaise/fatigue and diaphoresis.  HENT:  Negative for hearing loss, ear pain, nosebleeds, congestion, sore throat, neck pain, tinnitus and ear discharge.   Eyes: Negative for blurred vision, double vision, photophobia, pain, discharge and redness.  Respiratory: Negative for cough, hemoptysis, sputum production, shortness of breath, wheezing and stridor.   Cardiovascular: Negative for chest pain, palpitations, orthopnea, claudication, leg swelling and PND.  Gastrointestinal: negative for abdominal pain. Negative for heartburn, nausea, vomiting, diarrhea, constipation, blood in stool and melena.  Genitourinary: Negative for dysuria, urgency, frequency, hematuria and flank pain.  Musculoskeletal: Negative for myalgias, back pain, joint pain and falls.  Skin:  Negative for itching and rash.  Neurological: Negative for dizziness, tingling, tremors, sensory change, speech change, focal weakness, seizures, loss of consciousness, weakness and headaches.  Endo/Heme/Allergies: Negative for environmental allergies and polydipsia. Does not bruise/bleed easily.  Psychiatric/Behavioral: Negative for depression, suicidal ideas, hallucinations, memory loss and substance abuse. The patient is not nervous/anxious and does not have insomnia.        Objective:  Blood pressure (!) 80/50, pulse 76, height 5' 7.2" (1.707 m), weight 139 lb 6.4 oz (63.2 kg), last menstrual period 06/30/2016.   Physical Exam  Vitals reviewed. Constitutional: She is oriented to person, place, and time. She appears well-developed and well-nourished.  HENT:  Head: Normocephalic and atraumatic.        Right Ear: External ear normal.  Left Ear: External ear normal.  Nose: Nose normal.  Mouth/Throat: Oropharynx is clear and moist.  Eyes: Conjunctivae and EOM are normal. Pupils are equal, round, and reactive to light. Right eye exhibits no discharge. Left eye exhibits no discharge. No scleral icterus.  Neck: Normal range of motion. Neck supple. No tracheal deviation present. No thyromegaly present.  Cardiovascular: Normal rate, regular rhythm, normal heart sounds and intact distal pulses.  Exam reveals no gallop and no friction rub.   No murmur heard. Respiratory: Effort normal and breath sounds normal. No respiratory distress. She has no wheezes. She has no rales. She exhibits no tenderness.  GI: Soft. Bowel sounds are normal. She exhibits no distension and no mass. There is no tenderness. There is no rebound and no guarding.  Genitourinary:  Breasts no masses skin changes or nipple changes bilaterally      Vulva is normal without lesions Vagina is pink moist without discharge Cervix normal in appearance and pap is done Uterus is normal size shape and contour Adnexa is negative with  normal sized ovaries   Musculoskeletal: Normal range of motion. She exhibits no edema and no tenderness.  Neurological: She is alert and oriented to person, place, and time. She has normal reflexes. She displays normal reflexes. No cranial nerve deficit. She exhibits normal muscle tone. Coordination normal.  Skin: Skin is warm and dry. No rash noted. No erythema. No pallor.  Psychiatric: She has a normal mood and affect. Her behavior is normal. Judgment and thought content normal.       Medications Ordered at today's visit: Meds ordered this encounter  Medications  . fluconazole (DIFLUCAN) 150 MG tablet    Sig: 1 po stat; repeat in 3 days    Dispense:  2 tablet    Refill:  2    Other orders placed at today's visit: No orders of the defined types were placed in this encounter.     Assessment:    Healthy female exam.    Plan:    Contraception: none. Mammogram ordered. Follow up in: 1 year.     Return in about 1 year (around 07/15/2017).

## 2016-07-15 NOTE — Addendum Note (Signed)
Addended by: Federico FlakeNES, PEGGY A on: 07/15/2016 04:09 PM   Modules accepted: Orders

## 2016-07-17 LAB — CYTOLOGY - PAP: Diagnosis: NEGATIVE

## 2016-10-15 ENCOUNTER — Ambulatory Visit: Payer: Self-pay | Admitting: Adult Health

## 2017-03-23 ENCOUNTER — Other Ambulatory Visit: Payer: Self-pay | Admitting: Cardiology

## 2017-03-27 ENCOUNTER — Other Ambulatory Visit (HOSPITAL_COMMUNITY): Payer: Self-pay | Admitting: Advanced Practice Midwife

## 2017-03-27 DIAGNOSIS — Z1231 Encounter for screening mammogram for malignant neoplasm of breast: Secondary | ICD-10-CM

## 2017-03-29 ENCOUNTER — Other Ambulatory Visit: Payer: Self-pay | Admitting: Cardiology

## 2017-04-06 ENCOUNTER — Ambulatory Visit (HOSPITAL_COMMUNITY)
Admission: RE | Admit: 2017-04-06 | Discharge: 2017-04-06 | Disposition: A | Discharge status: Home / Self Care | Payer: BLUE CROSS/BLUE SHIELD | Source: Ambulatory Visit | Attending: Advanced Practice Midwife | Admitting: Advanced Practice Midwife

## 2017-04-06 DIAGNOSIS — Z1231 Encounter for screening mammogram for malignant neoplasm of breast: Secondary | ICD-10-CM | POA: Diagnosis not present

## 2017-05-04 ENCOUNTER — Other Ambulatory Visit: Payer: Self-pay | Admitting: Cardiology

## 2017-05-12 ENCOUNTER — Other Ambulatory Visit: Payer: Self-pay

## 2017-05-12 MED ORDER — METOPROLOL TARTRATE 50 MG PO TABS: 25.0000 mg | ORAL_TABLET | Freq: Two times a day (BID) | ORAL | 0 refills | Status: DC

## 2017-05-12 NOTE — Telephone Encounter (Signed)
Refilled lopressor per fax request 

## 2017-06-26 ENCOUNTER — Other Ambulatory Visit (HOSPITAL_COMMUNITY)
Admission: RE | Admit: 2017-06-26 | Discharge: 2017-06-26 | Disposition: A | Discharge status: Home / Self Care | Payer: BLUE CROSS/BLUE SHIELD | Source: Ambulatory Visit | Attending: Cardiovascular Disease | Admitting: Cardiovascular Disease

## 2017-06-26 ENCOUNTER — Encounter: Payer: Self-pay | Admitting: Cardiovascular Disease

## 2017-06-26 ENCOUNTER — Telehealth: Payer: Self-pay

## 2017-06-26 ENCOUNTER — Ambulatory Visit: Payer: BLUE CROSS/BLUE SHIELD | Admitting: Cardiovascular Disease

## 2017-06-26 VITALS — BP 118/64 | HR 72 | Ht 67.0 in | Wt 137.0 lb

## 2017-06-26 DIAGNOSIS — R002 Palpitations: Secondary | ICD-10-CM | POA: Diagnosis not present

## 2017-06-26 DIAGNOSIS — I341 Nonrheumatic mitral (valve) prolapse: Secondary | ICD-10-CM | POA: Diagnosis not present

## 2017-06-26 DIAGNOSIS — R209 Unspecified disturbances of skin sensation: Secondary | ICD-10-CM | POA: Diagnosis not present

## 2017-06-26 DIAGNOSIS — R0602 Shortness of breath: Secondary | ICD-10-CM | POA: Diagnosis not present

## 2017-06-26 DIAGNOSIS — R079 Chest pain, unspecified: Secondary | ICD-10-CM

## 2017-06-26 LAB — BASIC METABOLIC PANEL
Anion gap: 6 (ref 5–15)
BUN: 12 mg/dL (ref 6–20)
CO2: 26 mmol/L (ref 22–32)
Calcium: 9.2 mg/dL (ref 8.9–10.3)
Chloride: 107 mmol/L (ref 101–111)
Creatinine, Ser: 0.72 mg/dL (ref 0.44–1.00)
GFR calc Af Amer: >60 mL/min (ref >60)
GFR calc non Af Amer: >60 mL/min (ref >60)
Glucose, Bld: 97 mg/dL (ref 65–99)
Potassium: 4 mmol/L (ref 3.5–5.1)
Sodium: 139 mmol/L (ref 135–145)

## 2017-06-26 LAB — CBC WITH DIFFERENTIAL/PLATELET
Basophils Absolute: 0 10*3/uL (ref 0.0–0.1)
Basophils Relative: 0 %
Eosinophils Absolute: 0.1 10*3/uL (ref 0.0–0.7)
Eosinophils Relative: 2 %
HCT: 38.3 % (ref 36.0–46.0)
Hemoglobin: 12.9 g/dL (ref 12.0–15.0)
Lymphocytes Relative: 28 %
Lymphs Abs: 1.5 10*3/uL (ref 0.7–4.0)
MCH: 32.1 pg (ref 26.0–34.0)
MCHC: 33.7 g/dL (ref 30.0–36.0)
MCV: 95.3 fL (ref 78.0–100.0)
Monocytes Absolute: 0.3 10*3/uL (ref 0.1–1.0)
Monocytes Relative: 6 %
Neutro Abs: 3.5 10*3/uL (ref 1.7–7.7)
Neutrophils Relative %: 64 %
Platelets: 194 10*3/uL (ref 150–400)
RBC: 4.02 MIL/uL (ref 3.87–5.11)
RDW: 12 % (ref 11.5–15.5)
WBC: 5.4 10*3/uL (ref 4.0–10.5)

## 2017-06-26 LAB — MAGNESIUM: Magnesium: 2.3 mg/dL (ref 1.7–2.4)

## 2017-06-26 LAB — TSH: TSH: 2.084 u[IU]/mL (ref 0.350–4.500)

## 2017-06-26 NOTE — Patient Instructions (Addendum)
Your physician wants you to follow-up in: 2 months  with Dr.Koneswaran     Your physician recommends that you continue on your current medications as directed. Please refer to the Current Medication list given to you today.   If you need a refill on your cardiac medications before your next appointment, please call your pharmacy.    Your physician has recommended that you wear an event monitor. Event monitors are medical devices that record the heart's electrical activity. Doctors most often us these monitors to diagnose arrhythmias. Arrhythmias are problems with the speed or rhythm of the heartbeat. The monitor is a small, portable device. You can wear one while you do your normal daily activities. This is usually used to diagnose what is causing palpitations/syncope (passing out).    Get lab work now: cbc,bmet, tsh, magnesium    Thank you for choosing [facility] !

## 2017-06-26 NOTE — Telephone Encounter (Signed)
-----   Message from Laqueta LindenSuresh A Koneswaran, MD sent at 06/26/2017 11:57 AM EDT ----- Labs are all normal (renal function, potassium, magnesium), no evidence of anemia.

## 2017-06-26 NOTE — Telephone Encounter (Signed)
Called pt. No answer, left message for pt to return call.  

## 2017-06-26 NOTE — Progress Notes (Signed)
SUBJECTIVE: The patient presents to establish care with me.  She saw Dr. Wyline MoodBranch on 08/22/2015 and Dr. Patty SermonsBrackbill prior to that in March 2012.  She reportedly has a history of chest pain, palpitations, and mitral valve prolapse.    She underwent a normal exercise tolerance test on 08/24/2015.  [facility] treadmill score was 9. An echocardiogram on 10/04/2010 demonstrated normal left ventricular systolic and diastolic function with normal regional wall motion, LVEF 55 to 60%.  There was no evidence of mitral valve prolapse.  ECG performed in the office today which I ordered and personally interpreted demonstrates normal sinus rhythm with no ischemic ST segment or T-wave abnormalities, nor any arrhythmias.  She experiences palpitations about twice per week and they are associated with chest pain and shortness of breath.  About 2 weeks ago she had some lightheadedness and dizziness while walking and felt like she was going to faint but did not.  She takes 25 mill grams of metoprolol twice daily and sometimes up to 50 or 75 mill grams twice daily.  However, she does not like how it makes her feel as she feels lethargic and sluggish afterwards.  She has had palpitations since her early 6720s which is when she began seeing Dr. Patty SermonsBrackbill.  When she experiences chest pain and shortness of breath she starts panicking and then it exacerbates her asthma.  She denies orthopnea and leg swelling.  She does complain of both feet being cold with a light purplish color to her toes.  She does mention that her palpitations are exercise-induced.  She drinks 2 cups of coffee daily and drinks a lot of water but does not drink soda.  She denies having any recent blood work.  Family history: Her father died at the age of 10454 of cancer.  He had diabetes.  Her mother also has tachycardia as well as varicose veins.  She underwent CABG in her mid 2960s.  She was not a smoker but does have diabetes.  Social history: She is  engaged to be married.  Her fianc has a son.  She does not have any children of her own.  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    Review of Systems: As per "subjective", otherwise negative.  Allergies  Allergen Reactions  . Latex Rash    Current Outpatient Medications  Medication Sig Dispense Refill  . acetaminophen (TYLENOL) 500 MG tablet Take 500 mg by mouth every 6 (six) hours as needed for headache.    . albuterol (PROVENTIL HFA;VENTOLIN HFA) 108 (90 Base) MCG/ACT inhaler Inhale 2 puffs into the lungs every 6 (six) hours as needed for wheezing or shortness of breath.    . metoprolol tartrate (LOPRESSOR) 50 MG tablet Take 0.5-1 tablets (25-50 mg total) by mouth 2 (two) times daily. 20 tablet 0  . omeprazole (PRILOSEC) 20 MG capsule TAKE 1 CAPSULE (20 MG TOTAL) BY MOUTH DAILY. 30 capsule 0   No current facility-administered medications for this visit.     Past Medical History:  Diagnosis Date  . Asthma   . MVP (mitral valve prolapse)    Although no evidence per echo in 2009. Trace MR  . Palpitation   . Scoliosis     History reviewed. No pertinent surgical history.  Social History   Socioeconomic History  . Marital status: Married    Spouse name: Not on file  . Number of children: Not on file  . Years of education:  Not on file  . Highest education level: Not on file  Occupational History  . Not on file  Social Needs  . Financial resource strain: Not on file  . Food insecurity:    Worry: Not on file    Inability: Not on file  . Transportation needs:    Medical: Not on file    Non-medical: Not on file  Tobacco Use  . Smoking status: Never Smoker  . Smokeless tobacco: Never Used  Substance and Sexual Activity  . Alcohol use: Yes    Comment: occ  . Drug use: No  . Sexual activity: Yes    Birth control/protection: None  Lifestyle  . Physical activity:    Days per week: Not on file    Minutes per session: Not on file  . Stress:  Not on file  Relationships  . Social connections:    Talks on phone: Not on file    Gets together: Not on file    Attends religious service: Not on file    Active member of club or organization: Not on file    Attends meetings of clubs or organizations: Not on file    Relationship status: Not on file  . Intimate partner violence:    Fear of current or ex partner: Not on file    Emotionally abused: Not on file    Physically abused: Not on file    Forced sexual activity: Not on file  Other Topics Concern  . Not on file  Social History Narrative  . Not on file     Vitals:   06/26/17 0904  BP: 118/64  Pulse: 72  SpO2: 98%  Weight: 137 lb (62.1 kg)  Height: 5\' 7"  (1.702 m)    Wt Readings from Last 3 Encounters:  06/26/17 137 lb (62.1 kg)  07/15/16 139 lb 6.4 oz (63.2 kg)  06/26/16 144 lb (65.3 kg)     PHYSICAL EXAM General: NAD HEENT: Normal. Neck: No JVD, no thyromegaly. Lungs: Clear to auscultation bilaterally with normal respiratory effort. CV: Regular rate and rhythm, normal S1/S2, no S3/S4, no murmur. No pretibial or periankle edema.  No carotid bruit.   Abdomen: Soft, nontender, no distention.  Neurologic: Alert and oriented.  Psych: Normal affect. Skin: Normal. Musculoskeletal: No gross deformities.    ECG: Most recent ECG reviewed.   Labs: Lab Results  Component Value Date/Time   K 3.8 08/20/2015 07:09 PM   BUN 10 08/20/2015 07:09 PM   CREATININE 0.66 08/20/2015 07:09 PM   ALT 13 (L) 08/20/2015 07:09 PM   HGB 13.8 08/20/2015 07:09 PM     Lipids: Lab Results  Component Value Date/Time   LDLCALC 114 (H) 09/25/2010 08:37 AM   CHOL 199 09/25/2010 08:37 AM   TRIG 129.0 09/25/2010 08:37 AM   HDL 59.50 09/25/2010 08:37 AM       ASSESSMENT AND PLAN: 1.  Palpitations: She takes 25 to 50 mg twice daily and sometimes up to 75 mg occasionally.  Blood pressure and heart rate are normal.  ECG is also normal.  She is experienced these symptoms since her  early 36s.  I am uncertain if she is having premature atrial contractions or paroxysmal supraventricular tachycardia.  I will obtain a 2-week event monitor.  I will also check TSH, magnesium, basic metabolic panel, and CBC. I may consider switching to diltiazem.  2.  Feet discoloration/cold feet: She is a non-smoker.  She denies claudication pain.  She does have some spider veins on  the back of the right thigh above the knee but no large venous varicosities.  I will continue to monitor.  3.  Chest pain or shortness of breath: These appear to be related to palpitations.  I will continue to monitor.  She underwent a normal exercise tolerance test in 2017 as noted above.  She has normal cardiac function by her most recent echocardiogram in 2017.    Disposition: Follow up 2 months  Time spent: 40 minutes, of which greater than 50% was spent reviewing symptoms, relevant blood tests and studies, and discussing management plan with the patient.    Prentice Docker, M.D., F.A.C.C.

## 2017-07-07 ENCOUNTER — Telehealth: Payer: Self-pay | Admitting: Cardiovascular Disease

## 2017-07-07 NOTE — Telephone Encounter (Signed)
error 

## 2017-07-15 ENCOUNTER — Ambulatory Visit (INDEPENDENT_AMBULATORY_CARE_PROVIDER_SITE_OTHER): Payer: BLUE CROSS/BLUE SHIELD

## 2017-07-15 DIAGNOSIS — R079 Chest pain, unspecified: Secondary | ICD-10-CM

## 2017-07-15 DIAGNOSIS — R002 Palpitations: Secondary | ICD-10-CM

## 2017-07-16 ENCOUNTER — Other Ambulatory Visit: Payer: Self-pay | Admitting: Advanced Practice Midwife

## 2017-08-26 ENCOUNTER — Ambulatory Visit: Payer: BLUE CROSS/BLUE SHIELD | Admitting: Cardiovascular Disease

## 2017-08-26 ENCOUNTER — Encounter: Payer: Self-pay | Admitting: Cardiovascular Disease

## 2017-08-26 VITALS — BP 102/62 | HR 66 | Ht 67.0 in | Wt 139.0 lb

## 2017-08-26 DIAGNOSIS — R209 Unspecified disturbances of skin sensation: Secondary | ICD-10-CM | POA: Diagnosis not present

## 2017-08-26 DIAGNOSIS — R002 Palpitations: Secondary | ICD-10-CM

## 2017-08-26 DIAGNOSIS — R0602 Shortness of breath: Secondary | ICD-10-CM

## 2017-08-26 MED ORDER — METOPROLOL TARTRATE 25 MG PO TABS: 12.5000 mg | ORAL_TABLET | Freq: Two times a day (BID) | ORAL | 3 refills | Status: DC

## 2017-08-26 NOTE — Patient Instructions (Addendum)
Medication Instructions:  Decrease metoprolol to 12.5 mg two times daily- call back in 6 weeks to let Dr. Purvis SheffieldKoneswaran know if you are feeling any better    Labwork: none  Testing/Procedures: none  Follow-Up: Your physician recommends that you schedule a follow-up appointment in: December 2019   Any Other Special Instructions Will Be Listed Below (If Applicable).     If you need a refill on your cardiac medications before your next appointment, please call your pharmacy.

## 2017-08-26 NOTE — Progress Notes (Signed)
SUBJECTIVE: The patient returns for follow-up after undergoing cardiovascular testing performed for the evaluation of palpitations and chest discomfort. Magnesium levels, CBC, basic metabolic panel, and TSH were all normal.  Event monitoring demonstrated sinus rhythm and sinus tachycardia, maximum heart rate 123 bpm at 2:42 PM.  There were no significant arrhythmias.  She has had episodic palpitations since having when the event monitor.  She did not use metoprolol while she wore the monitor.  She has been taking 25 mg of Lopressor every other day but yesterday took 50 mg as she experienced palpitations which occurred suddenly and lasted for about 40 seconds.  They usually occur in the morning.  She had been walking across the warehouse floor and was not doing any heavy lifting or anything strenuous.  She did have some associated shortness of breath, lightheadedness, and dizziness but denies syncope.  She denies chest discomfort.  She also recently tweaked her right ankle and foot and has some foot pain along the lateral aspect of her right foot into the small toe.  She has made an appointment to see a podiatrist.     Family history: Her father died at the age of 854 of cancer.  He had diabetes.  Her mother also has tachycardia as well as varicose veins.  She underwent CABG in her mid 6060s.  She was not a smoker but does have diabetes.  Social history: She is engaged to be married.  Her fianc has a son.  She does not have any children of her own.  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   Review of Systems: As per "subjective", otherwise negative.  Allergies  Allergen Reactions  . Latex Rash    Current Outpatient Medications  Medication Sig Dispense Refill  . acetaminophen (TYLENOL) 500 MG tablet Take 500 mg by mouth every 6 (six) hours as needed for headache.    . albuterol (PROVENTIL HFA;VENTOLIN HFA) 108 (90 Base) MCG/ACT inhaler Inhale 2 puffs into  the lungs every 6 (six) hours as needed for wheezing or shortness of breath.    . metoprolol tartrate (LOPRESSOR) 50 MG tablet Take 0.5-1 tablets (25-50 mg total) by mouth 2 (two) times daily. 20 tablet 0  . omeprazole (PRILOSEC) 20 MG capsule TAKE 1 CAPSULE (20 MG TOTAL) BY MOUTH DAILY. 30 capsule 0   No current facility-administered medications for this visit.     Past Medical History:  Diagnosis Date  . Asthma   . MVP (mitral valve prolapse)    Although no evidence per echo in 2009. Trace MR  . Palpitation   . Scoliosis     History reviewed. No pertinent surgical history.  Social History   Socioeconomic History  . Marital status: Married    Spouse name: Not on file  . Number of children: Not on file  . Years of education: Not on file  . Highest education level: Not on file  Occupational History  . Not on file  Social Needs  . Financial resource strain: Not on file  . Food insecurity:    Worry: Not on file    Inability: Not on file  . Transportation needs:    Medical: Not on file    Non-medical: Not on file  Tobacco Use  . Smoking status: Never Smoker  . Smokeless tobacco: Never Used  Substance and Sexual Activity  . Alcohol use: Yes    Comment: occ  . Drug use: No  .  Sexual activity: Yes    Birth control/protection: None  Lifestyle  . Physical activity:    Days per week: Not on file    Minutes per session: Not on file  . Stress: Not on file  Relationships  . Social connections:    Talks on phone: Not on file    Gets together: Not on file    Attends religious service: Not on file    Active member of club or organization: Not on file    Attends meetings of clubs or organizations: Not on file    Relationship status: Not on file  . Intimate partner violence:    Fear of current or ex partner: Not on file    Emotionally abused: Not on file    Physically abused: Not on file    Forced sexual activity: Not on file  Other Topics Concern  . Not on file  Social  History Narrative  . Not on file     Vitals:   08/26/17 0901  BP: 102/62  Pulse: 66  SpO2: 99%  Weight: 139 lb (63 kg)  Height: 5\' 7"  (1.702 m)    Wt Readings from Last 3 Encounters:  08/26/17 139 lb (63 kg)  06/26/17 137 lb (62.1 kg)  07/15/16 139 lb 6.4 oz (63.2 kg)     PHYSICAL EXAM General: NAD HEENT: Normal. Neck: No JVD, no thyromegaly. Lungs: Clear to auscultation bilaterally with normal respiratory effort. CV: Regular rate and rhythm, normal S1/S2, no S3/S4, no murmur. No pretibial or periankle edema.  No carotid bruit.   Abdomen: Soft, nontender, no distention.  Neurologic: Alert and oriented.  Psych: Normal affect. Skin: Normal. Musculoskeletal: No gross deformities.    ECG: Reviewed above under Subjective   Labs: Lab Results  Component Value Date/Time   K 4.0 06/26/2017 10:00 AM   BUN 12 06/26/2017 10:00 AM   CREATININE 0.72 06/26/2017 10:00 AM   ALT 13 (L) 08/20/2015 07:09 PM   TSH 2.084 06/26/2017 10:00 AM   HGB 12.9 06/26/2017 10:00 AM     Lipids: Lab Results  Component Value Date/Time   LDLCALC 114 (H) 09/25/2010 08:37 AM   CHOL 199 09/25/2010 08:37 AM   TRIG 129.0 09/25/2010 08:37 AM   HDL 59.50 09/25/2010 08:37 AM       ASSESSMENT AND PLAN:  1.  Palpitations: She continues to have symptoms.  Labs were normal as detailed above.  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  She has been taking 25 mg of metoprolol every other day but took 50 mg yesterday for increased palpitations.   I will try Lopressor 12.5 mg twice daily.  I asked her to do this consistently for 6 weeks and to call me back to let me know if she has noticed any improvement.  2.  Feet discoloration/cold feet: She is a non-smoker.  She denies claudication pain.  She does have some spider veins on the back of the right thigh above the knee but no large venous varicosities.  3.  Chest pain and shortness of breath: These appear to be related to  palpitations.  I will continue to monitor.  She underwent a normal exercise tolerance test in 2017 as noted above.  She has normal cardiac function by her most recent echocardiogram in 2017.   Disposition: Follow up 3 to 4 months   Prentice DockerSuresh Annjeanette Sarwar, M.D., F.A.C.C.

## 2017-12-10 ENCOUNTER — Encounter: Payer: Self-pay | Admitting: Cardiovascular Disease

## 2017-12-10 ENCOUNTER — Ambulatory Visit: Payer: BLUE CROSS/BLUE SHIELD | Admitting: Cardiovascular Disease

## 2017-12-10 VITALS — BP 98/70 | HR 70 | Ht 67.0 in | Wt 135.2 lb

## 2017-12-10 DIAGNOSIS — R002 Palpitations: Secondary | ICD-10-CM

## 2017-12-10 DIAGNOSIS — R079 Chest pain, unspecified: Secondary | ICD-10-CM

## 2017-12-10 DIAGNOSIS — M79671 Pain in right foot: Secondary | ICD-10-CM

## 2017-12-10 NOTE — Patient Instructions (Signed)
Your physician wants you to follow-up in:6 months  with Dr.Koneswaran You will receive a reminder letter in the mail two months in advance. If you don't receive a letter, please call our office to schedule the follow-up appointment.      Your physician recommends that you continue on your current medications as directed. Please refer to the Current Medication list given to you today.     If you need a refill on your cardiac medications before your next appointment, please call your pharmacy.      No labs or tests today       Thank you for choosing [facility] !         

## 2017-12-10 NOTE — Progress Notes (Signed)
SUBJECTIVE: The patient presents for routine follow-up.  She has been doing fairly well overall.  She has not been bothered by palpitations.  She did have an episode of upper left-sided chest discomfort yesterday evening.  She took some deep breaths and after a few minutes symptoms resolved.  She denies exertional chest pain and dyspnea.  She does note increased stress in her personal life as well as work.  She has noticed some sharp pains along the lateral aspect of her right foot.  She denies swelling and discoloration.  She has changed her footwear as she does a lot of walking at work.  She denies lightheadedness and dizziness.      Review of Systems: As per "subjective", otherwise negative.  Allergies  Allergen Reactions  . Latex Rash    Current Outpatient Medications  Medication Sig Dispense Refill  . acetaminophen (TYLENOL) 500 MG tablet Take 500 mg by mouth every 6 (six) hours as needed for headache.    . albuterol (PROVENTIL HFA;VENTOLIN HFA) 108 (90 Base) MCG/ACT inhaler Inhale 2 puffs into the lungs every 6 (six) hours as needed for wheezing or shortness of breath.    Marland Kitchen. ibuprofen (ADVIL) 200 MG tablet Take 200 mg by mouth every 6 (six) hours as needed.    . metoprolol tartrate (LOPRESSOR) 25 MG tablet Take 0.5 tablets (12.5 mg total) by mouth 2 (two) times daily. 180 tablet 3  . omeprazole (PRILOSEC) 20 MG capsule TAKE 1 CAPSULE (20 MG TOTAL) BY MOUTH DAILY. 30 capsule 0   No current facility-administered medications for this visit.     Past Medical History:  Diagnosis Date  . Asthma   . MVP (mitral valve prolapse)    Although no evidence per echo in 2009. Trace MR  . Palpitation   . Scoliosis     No past surgical history on file.  Social History   Socioeconomic History  . Marital status: Married    Spouse name: Not on file  . Number of children: Not on file  . Years of education: Not on file  . Highest education level: Not on file  Occupational History    . Not on file  Social Needs  . Financial resource strain: Not on file  . Food insecurity:    Worry: Not on file    Inability: Not on file  . Transportation needs:    Medical: Not on file    Non-medical: Not on file  Tobacco Use  . Smoking status: Never Smoker  . Smokeless tobacco: Never Used  Substance and Sexual Activity  . Alcohol use: Yes    Comment: occ  . Drug use: No  . Sexual activity: Yes    Birth control/protection: None  Lifestyle  . Physical activity:    Days per week: Not on file    Minutes per session: Not on file  . Stress: Not on file  Relationships  . Social connections:    Talks on phone: Not on file    Gets together: Not on file    Attends religious service: Not on file    Active member of club or organization: Not on file    Attends meetings of clubs or organizations: Not on file    Relationship status: Not on file  . Intimate partner violence:    Fear of current or ex partner: Not on file    Emotionally abused: Not on file    Physically abused: Not on file  Forced sexual activity: Not on file  Other Topics Concern  . Not on file  Social History Narrative  . Not on file     Vitals:   12/10/17 0820  BP: 98/70  Pulse: 70  SpO2: 100%  Weight: 135 lb 3.2 oz (61.3 kg)  Height: 5\' 7"  (1.702 m)    Wt Readings from Last 3 Encounters:  12/10/17 135 lb 3.2 oz (61.3 kg)  08/26/17 139 lb (63 kg)  06/26/17 137 lb (62.1 kg)     PHYSICAL EXAM General: NAD HEENT: Normal. Neck: No JVD, no thyromegaly. Lungs: Clear to auscultation bilaterally with normal respiratory effort. CV: Regular rate and rhythm, normal S1/S2, no S3/S4, no murmur. No pretibial or periankle edema.  No carotid bruit.   Abdomen: Soft, nontender, no distention.  Neurologic: Alert and oriented.  Psych: Normal affect. Skin: Normal. Musculoskeletal: No gross deformities.    ECG: Reviewed above under Subjective   Labs: Lab Results  Component Value Date/Time   K 4.0  06/26/2017 10:00 AM   BUN 12 06/26/2017 10:00 AM   CREATININE 0.72 06/26/2017 10:00 AM   ALT 13 (L) 08/20/2015 07:09 PM   TSH 2.084 06/26/2017 10:00 AM   HGB 12.9 06/26/2017 10:00 AM     Lipids: Lab Results  Component Value Date/Time   LDLCALC 114 (H) 09/25/2010 08:37 AM   CHOL 199 09/25/2010 08:37 AM   TRIG 129.0 09/25/2010 08:37 AM   HDL 59.50 09/25/2010 08:37 AM       ASSESSMENT AND PLAN:  1.  Palpitations: Symptomatically stable.  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  She is tolerating Lopressor 12.5 mg twice daily.  No changes to therapy.  2.  Chest discomfort: Most recent episode described above may have been related to anxiety and stress.  I asked her to continue to monitor her symptoms.  She denies exertional symptoms.  3.  Right lateral foot pain: She did have bursitis in this area previously.  Symptom recurrence began this week.  She may follow-up with podiatry.   Disposition: Follow up 6 months.   Prentice Docker, M.D., F.A.C.C.

## 2018-02-08 ENCOUNTER — Other Ambulatory Visit: Payer: Self-pay | Admitting: Obstetrics & Gynecology

## 2018-03-02 ENCOUNTER — Other Ambulatory Visit (HOSPITAL_COMMUNITY): Payer: Self-pay | Admitting: Advanced Practice Midwife

## 2018-03-02 DIAGNOSIS — Z1231 Encounter for screening mammogram for malignant neoplasm of breast: Secondary | ICD-10-CM

## 2018-03-04 ENCOUNTER — Other Ambulatory Visit (HOSPITAL_COMMUNITY)
Admission: RE | Admit: 2018-03-04 | Discharge: 2018-03-04 | Disposition: A | Discharge status: Home / Self Care | Payer: BLUE CROSS/BLUE SHIELD | Source: Ambulatory Visit | Attending: Obstetrics & Gynecology | Admitting: Obstetrics & Gynecology

## 2018-03-04 ENCOUNTER — Ambulatory Visit (INDEPENDENT_AMBULATORY_CARE_PROVIDER_SITE_OTHER): Payer: BLUE CROSS/BLUE SHIELD | Admitting: Advanced Practice Midwife

## 2018-03-04 ENCOUNTER — Encounter: Payer: Self-pay | Admitting: Advanced Practice Midwife

## 2018-03-04 VITALS — Ht 67.0 in | Wt 138.5 lb

## 2018-03-04 DIAGNOSIS — Z01419 Encounter for gynecological examination (general) (routine) without abnormal findings: Secondary | ICD-10-CM | POA: Insufficient documentation

## 2018-03-04 DIAGNOSIS — Z131 Encounter for screening for diabetes mellitus: Secondary | ICD-10-CM

## 2018-03-04 DIAGNOSIS — Z13220 Encounter for screening for lipoid disorders: Secondary | ICD-10-CM

## 2018-03-04 NOTE — Progress Notes (Signed)
Mariah Burnett 43 y.o.  There were no vitals filed for this visit.   Filed Weights   03/04/18 1511  Weight: 138 lb 8 oz (62.8 kg)    Past Medical History: Past Medical History:  Diagnosis Date  . Asthma   . MVP (mitral valve prolapse)    Although no evidence per echo in 2009. Trace MR  . Palpitation   . Scoliosis     Past Surgical History: History reviewed. No pertinent surgical history.  Family History: Family History  Problem Relation Age of Onset  . Heart disease Mother   . Heart attack Mother   . Breast cancer Mother   . Cancer Father   . Cancer Paternal Grandfather   . Emphysema Paternal Grandfather     Social History: Social History   Tobacco Use  . Smoking status: Never Smoker  . Smokeless tobacco: Never Used  Substance Use Topics  . Alcohol use: Yes    Comment: occ  . Drug use: No    Allergies:  Allergies  Allergen Reactions  . Latex Rash      Current Outpatient Medications:  .  metoprolol tartrate (LOPRESSOR) 25 MG tablet, Take 25 mg by mouth 2 (two) times daily., Disp: , Rfl:  .  omeprazole (PRILOSEC) 20 MG capsule, TAKE 1 CAPSULE (20 MG TOTAL) BY MOUTH DAILY., Disp: 30 capsule, Rfl: 0  History of Present Illness: Here for pap and physical.  Last pap 2017, normal  Had full panel of inherited genetic testing done--all were negative. Has never gotten pregnant, not on BC> Treasted by cardiology for palpitations (negative w/u)   Review of Systems   Patient denies any headaches, blurred vision, shortness of breath, chest pain, abdominal pain, problems with bowel movements, urination, or intercourse (spotting all this week since intercourse this weekend, hasn't done that before).   Physical Exam: General:  Well developed, well nourished, no acute distress Skin:  Warm and dry Neck:  Midline trachea, normal thyroid Lungs; Clear to auscultation bilaterally Breast:  No dominant palpable mass, retraction, or nipple discharge Cardiovascular:  Regular rate and rhythm Abdomen:  Soft, non tender, no hepatosplenomegaly Pelvic:  External genitalia is normal in appearance.  The vagina is normal in appearance.  The cervix is nulliparous.  Has a 1/2 in superficial lac (? Fingernail)? and 2 probable vessels, all very friable.  Co Exam w/LHE. Coated w/Monsels. Uterus is felt to be normal size, shape, and contour.  No adnexal masses or tenderness noted.  Extremities:  No swelling or varicosities noted Psych:  No mood changes.     Impression:normal GYN exam     Plan: if pap normal , may go to q 3 years.  Continue yearly mammograms  Orders Placed This Encounter  Procedures  . HgB A1c  . Lipid panel    Order Specific Question:   Has the patient fasted?    Answer:   Yes

## 2018-03-09 LAB — CYTOLOGY - PAP
Diagnosis: NEGATIVE
HPV: NOT DETECTED

## 2018-03-17 DIAGNOSIS — R011 Cardiac murmur, unspecified: Secondary | ICD-10-CM | POA: Insufficient documentation

## 2018-03-17 DIAGNOSIS — J45909 Unspecified asthma, uncomplicated: Secondary | ICD-10-CM | POA: Insufficient documentation

## 2018-04-12 ENCOUNTER — Ambulatory Visit (HOSPITAL_COMMUNITY): Payer: BLUE CROSS/BLUE SHIELD

## 2018-04-26 ENCOUNTER — Telehealth: Payer: Self-pay | Admitting: Advanced Practice Midwife

## 2018-04-26 NOTE — Telephone Encounter (Signed)
Patient is having uncomfortable feeling, not sure if she had a bacteria infection.  [facility] [place]  [phone]

## 2018-04-27 ENCOUNTER — Ambulatory Visit: Payer: BLUE CROSS/BLUE SHIELD | Admitting: Obstetrics & Gynecology

## 2018-04-27 ENCOUNTER — Encounter: Payer: Self-pay | Admitting: Obstetrics & Gynecology

## 2018-04-27 ENCOUNTER — Other Ambulatory Visit: Payer: Self-pay

## 2018-04-27 ENCOUNTER — Telehealth: Payer: Self-pay | Admitting: *Deleted

## 2018-04-27 VITALS — BP 115/81 | HR 87 | Temp 98.9°F | Ht 67.0 in | Wt 131.0 lb

## 2018-04-27 DIAGNOSIS — N760 Acute vaginitis: Secondary | ICD-10-CM

## 2018-04-27 DIAGNOSIS — B9689 Other specified bacterial agents as the cause of diseases classified elsewhere: Secondary | ICD-10-CM

## 2018-04-27 MED ORDER — METRONIDAZOLE 0.75 % VA GEL: VAGINAL | 2 refills | Status: DC

## 2018-04-27 NOTE — Telephone Encounter (Signed)
Patient called with c/o gray/brown discharge that she noticed yesterday along with external vaginal itching and "needle type pain".  She has not noticed an odor and most of her discomfort is external. Slight burning with urination yesterday as well.  Can something be sent in for her or does she need OV? Please advise.

## 2018-04-27 NOTE — Telephone Encounter (Signed)
LMOVM that per Dr Despina Hidden, she would need to be seen as her symptoms are not very clear.  Advised to call and schedule appt.

## 2018-04-27 NOTE — Telephone Encounter (Signed)
Pt would like to see if she can come in today for an appt.

## 2018-04-27 NOTE — Progress Notes (Signed)
       Chief Complaint  Patient presents with  . Vaginal Discharge    pain    Blood pressure 115/81, pulse 87, temperature 98.9 F (37.2 C), height 5\' 7"  (1.702 m), weight 131 lb (59.4 kg), last menstrual period 03/07/2018.  43 y.o. G0P0000 Patient's last menstrual period was 03/07/2018. The current method of family planning is none. Pt is comfortable on no current BCM  Subjective Vaginal discharge for 5days Itching no Irritation yes Odor yes Similar to previous yes  Previous treatment metronidazole  Objective Vulva:  normal appearing vulva with no masses, tenderness or lesions Vagina:  normal mucosa, thin grey discharge Cervix:  no cervical motion tenderness and no lesions Uterus:  normal size, contour, position, consistency, mobility, non-tender Adnexa: ovaries:present,  normal adnexa in size, nontender and no masses     Pertinent ROS No burning with urination, frequency or urgency No nausea, vomiting or diarrhea Nor fever chills or other constitutional symptoms   Labs or studies Wet Prep:   A sample of vaginal discharge was obtained from the posterior fornix using a cotton swab. 2 drops of saline were placed on a slide and the cotton swab was immersed in the saline. Microscopic evaluation was performed and results were as follows:  Negative  for yeast  Positive for clue cells , consistent with Bacterial vaginosis Negative for trichomonas  Normal WBC population   Whiff test: Negative     Impression Diagnoses this Encounter::   ICD-10-CM   1. BV (bacterial vaginosis) N76.0    B96.89     Established relevant diagnosis(es):   Plan/Recommendations: Meds ordered this encounter  Medications  . metroNIDAZOLE (METROGEL VAGINAL) 0.75 % vaginal gel    Sig: Nightly x 5 nights    Dispense:  70 g    Refill:  2    Labs or Scans Ordered: No orders of the defined types were placed in this encounter.   Management:: >BV, somewhat recurrent, seems to be  sensitive to recurrent encounters, discussed strategies to mitigate  Follow up Return if symptoms worsen or fail to improve.     All questions were answered.

## 2018-04-27 NOTE — Telephone Encounter (Signed)
She will need to be seen and evaluated, not clear from her symptoms/description

## 2018-04-28 LAB — HEMOGLOBIN A1C
Est. average glucose Bld gHb Est-mCnc: 103 mg/dL
Hgb A1c MFr Bld: 5.2 % (ref 4.8–5.6)

## 2018-04-28 LAB — LIPID PANEL
Chol/HDL Ratio: 2.9 ratio (ref 0.0–4.4)
Cholesterol, Total: 179 mg/dL (ref 100–199)
HDL: 61 mg/dL (ref >39)
LDL Calculated: 99 mg/dL (ref 0–99)
Triglycerides: 95 mg/dL (ref 0–149)
VLDL Cholesterol Cal: 19 mg/dL (ref 5–40)

## 2018-05-12 ENCOUNTER — Ambulatory Visit (HOSPITAL_COMMUNITY): Payer: BLUE CROSS/BLUE SHIELD

## 2018-05-19 ENCOUNTER — Other Ambulatory Visit: Payer: Self-pay

## 2018-05-19 ENCOUNTER — Ambulatory Visit (HOSPITAL_COMMUNITY)
Admission: RE | Admit: 2018-05-19 | Discharge: 2018-05-19 | Disposition: A | Discharge status: Home / Self Care | Payer: BLUE CROSS/BLUE SHIELD | Source: Ambulatory Visit | Attending: Advanced Practice Midwife | Admitting: Advanced Practice Midwife

## 2018-05-19 DIAGNOSIS — Z1231 Encounter for screening mammogram for malignant neoplasm of breast: Secondary | ICD-10-CM | POA: Insufficient documentation

## 2018-06-18 ENCOUNTER — Encounter: Payer: Self-pay | Admitting: Allergy & Immunology

## 2018-06-18 ENCOUNTER — Other Ambulatory Visit: Payer: Self-pay

## 2018-06-18 ENCOUNTER — Ambulatory Visit: Payer: BC Managed Care – PPO | Admitting: Allergy & Immunology

## 2018-06-18 VITALS — BP 114/70 | HR 72 | Temp 98.5°F | Resp 18 | Ht 67.5 in | Wt 137.8 lb

## 2018-06-18 DIAGNOSIS — J302 Other seasonal allergic rhinitis: Secondary | ICD-10-CM | POA: Diagnosis not present

## 2018-06-18 DIAGNOSIS — J3089 Other allergic rhinitis: Secondary | ICD-10-CM | POA: Diagnosis not present

## 2018-06-18 DIAGNOSIS — J4530 Mild persistent asthma, uncomplicated: Secondary | ICD-10-CM | POA: Diagnosis not present

## 2018-06-18 DIAGNOSIS — J310 Chronic rhinitis: Secondary | ICD-10-CM

## 2018-06-18 MED ORDER — ASMANEX HFA 100 MCG/ACT IN AERO: 1.0000 | INHALATION_SPRAY | Freq: Two times a day (BID) | RESPIRATORY_TRACT | 2 refills | Status: DC

## 2018-06-18 MED ORDER — AZELASTINE HCL 0.1 % NA SOLN: 2.0000 | Freq: Two times a day (BID) | NASAL | 2 refills | Status: DC | PRN

## 2018-06-18 MED ORDER — LEVOCETIRIZINE DIHYDROCHLORIDE 5 MG PO TABS: 5.0000 mg | ORAL_TABLET | Freq: Every evening | ORAL | 2 refills | Status: DC

## 2018-06-18 NOTE — Patient Instructions (Addendum)
1. Mild persistent asthma, uncomplicated -With how often you are needing her rescue inhaler as well as her nighttime awakenings, I think it would be a good idea to start a daily inhaled steroid. - Spacer sample and demonstration provided. - Daily controller medication(s): Asmanex 153mcg 1 puffs twice daily - Prior to physical activity: albuterol 2 puffs 10-15 minutes before physical activity. - Rescue medications: albuterol 4 puffs every 4-6 hours as needed - Changes during respiratory infections or worsening symptoms: Increase Asmanex to 2 puffs twice daily for ONE TO TWO WEEKS. - Asthma control goals:  * Full participation in all desired activities (may need albuterol before activity) * Albuterol use two time or less a week on average (not counting use with activity) * Cough interfering with sleep two time or less a month * Oral steroids no more than once a year * No hospitalizations  2. Seasonal and perennial allergic rhinitis - Testing today showed: rabbit, grasses, ragweed, trees, indoor molds, outdoor molds, dust mites, cat, dog and cockroach - Copy of test results provided.  - Avoidance measures provided. - Start taking: Xyzal (levocetirizine) 5mg  tablet once daily, Nasacort (triamcinolone) two sprays per nostril daily and Astelin (azelastine) 2 sprays per nostril 1-2 times daily as needed - You can use an extra dose of the antihistamine, if needed, for breakthrough symptoms.  - Consider nasal saline rinses 1-2 times daily to remove allergens from the nasal cavities as well as help with mucous clearance (this is especially helpful to do before the nasal sprays are given) - Consider allergy shots as a means of long-term control. - Allergy shots "re-train" and "reset" the immune system to ignore environmental allergens and decrease the resulting immune response to those allergens (sneezing, itchy watery eyes, runny nose, nasal congestion, etc).    - Allergy shots improve symptoms in  75-85% of patients.  - We can discuss more at the next appointment if the medications are not working for you.  3. Gustatory rhinitis - The regular use of the Nasacort could help with the rhinorrhea during eating. - This is not indicative of a food allergy.  4. Return in about 3 months (around 09/18/2018). This can be an in-person, a virtual Webex or a telephone follow up visit.   Please inform us of any Emergency Department visits, hospitalizations, or changes in symptoms. Call us before going to the ED for breathing or allergy symptoms since we might be able to fit you in for a sick visit. Feel free to contact us anytime with any questions, problems, or concerns.  It was a pleasure to meet you today!  Websites that have reliable patient information: 1. American Academy of Asthma, Allergy, and Immunology: www.aaaai.org 2. Food Allergy Research and Education (FARE): foodallergy.org 3. Mothers of Asthmatics: http://www.asthmacommunitynetwork.org 4. American College of Allergy, Asthma, and Immunology: www.acaai.org  "Like" [place] on Facebook and Instagram for our latest updates!      Make sure you are registered to vote! If you have moved or changed any of your contact information, you will need to get this updated before voting!    Voter ID laws are NOT going into effect for the General Election in November 2020! DO NOT let this stop you from exercising your right to vote!   Absentee voting is the SAFEST way to vote during the coronavirus pandemic! Download and print an absentee ballot request form at https://[email or web address]  More information on absentee ballots can be found here: [email or web address]/[email or web address]-[email or web address]   Reducing Pollen Exposure  The  American Academy of Allergy, Asthma and Immunology suggests the following steps to reduce your exposure to pollen during allergy seasons.    1. Do not hang sheets or clothing out to  dry; pollen may collect on these items. 2. Do not mow lawns or spend time around freshly cut grass; mowing stirs up pollen. 3. Keep windows closed at night.  Keep car windows closed while driving. 4. Minimize morning activities outdoors, a time when pollen counts are usually at their highest. 5. Stay indoors as much as possible when pollen counts or humidity is high and on windy days when pollen tends to remain in the air longer. 6. Use air conditioning when possible.  Many air conditioners have filters that trap the pollen spores. 7. Use a HEPA room air filter to remove pollen form the indoor air you breathe.  Control of Mold Allergen   Mold and fungi can grow on a variety of surfaces provided certain temperature and moisture conditions exist.  Outdoor molds grow on plants, decaying vegetation and soil.  The major outdoor mold, Alternaria and Cladosporium, are found in very high numbers during hot and dry conditions.  Generally, a late Summer - Fall peak is seen for common outdoor fungal spores.  Rain will temporarily lower outdoor mold spore count, but counts rise rapidly when the rainy period ends.  The most important indoor molds are Aspergillus and Penicillium.  Dark, humid and poorly ventilated basements are ideal sites for mold growth.  The next most common sites of mold growth are the bathroom and the kitchen.  Outdoor (Seasonal) Mold Control  Positive outdoor molds via skin testing: Alternaria  1. Use air conditioning and keep windows closed 2. Avoid exposure to decaying vegetation. 3. Avoid leaf raking. 4. Avoid grain handling. 5. Consider wearing a face mask if working in moldy areas.  6.   Indoor (Perennial) Mold Control   Positive indoor molds via skin testing: Fusarium, Aureobasidium (Pullulara) and Rhizopus  1. Maintain humidity below 50%. 2. Clean washable surfaces with 5% bleach solution. 3. Remove sources e.g. contaminated carpets.     Control of Dog or Cat  Allergen  Avoidance is the best way to manage a dog or cat allergy. If you have a dog or cat and are allergic to dog or cats, consider removing the dog or cat from the home. If you have a dog or cat but don't want to find it a new home, or if your family wants a pet even though someone in the household is allergic, here are some strategies that may help keep symptoms at bay:  1. Keep the pet out of your bedroom and restrict it to only a few rooms. Be advised that keeping the dog or cat in only one room will not limit the allergens to that room. 2. Don't pet, hug or kiss the dog or cat; if you do, wash your hands with soap and water. 3. High-efficiency particulate air (HEPA) cleaners run continuously in a bedroom or living room can reduce allergen levels over time. 4. Regular use of a high-efficiency vacuum cleaner or a central vacuum can reduce allergen levels. 5. Giving your dog or cat a bath at least once a week can reduce airborne allergen.  Control of House Dust Mite Allergen    House dust mites play a major role in allergic asthma and rhinitis.  They occur in environments with high humidity wherever human skin, the food for dust mites is found. High levels have been detected  in dust obtained from mattresses, pillows, carpets, upholstered furniture, bed covers, clothes and soft toys.  The principal allergen of the house dust mite is found in its feces.  A gram of dust may contain 1,000 mites and 250,000 fecal particles.  Mite antigen is easily measured in the air during house cleaning activities.    1. Encase mattresses, including the box spring, and pillow, in an air tight cover.  Seal the zipper end of the encased mattresses with wide adhesive tape. 2. Wash the bedding in water of 130 degrees Farenheit weekly.  Avoid cotton comforters/quilts and flannel bedding: the most ideal bed covering is the dacron comforter. 3. Remove all upholstered furniture from the bedroom. 4. Remove carpets,  carpet padding, rugs, and non-washable window drapes from the bedroom.  Wash drapes weekly or use plastic window coverings. 5. Remove all non-washable stuffed toys from the bedroom.  Wash stuffed toys weekly. 6. Have the room cleaned frequently with a vacuum cleaner and a damp dust-mop.  The patient should not be in a room which is being cleaned and should wait 1 hour after cleaning before going into the room. 7. Close and seal all heating outlets in the bedroom.  Otherwise, the room will become filled with dust-laden air.  An electric heater can be used to heat the room. 8. Reduce indoor humidity to less than 50%.  Do not use a humidifier.  Control of Cockroach Allergen  Cockroach allergen has been identified as an important cause of acute attacks of asthma, especially in urban settings.  There are fifty-five species of cockroach that exist in the [place], however only three, the [place], [place] and Oriental species produce allergen that can affect patients with Asthma.  Allergens can be obtained from fecal particles, egg casings and secretions from cockroaches.    1. Remove food sources. 2. Reduce access to water. 3. Seal access and entry points. 4. Spray runways with 0.5-1% Diazinon or Chlorpyrifos 5. Blow boric acid power under stoves and refrigerator. 6. Place bait stations (hydramethylnon) at feeding sites.  Allergy Shots   Allergies are the result of a chain reaction that starts in the immune system. Your immune system controls how your body defends itself. For instance, if you have an allergy to pollen, your immune system identifies pollen as an invader or allergen. Your immune system overreacts by producing antibodies called Immunoglobulin E (IgE). These antibodies travel to cells that release chemicals, causing an allergic reaction.  The concept behind allergy immunotherapy, whether it is received in the form of shots or tablets, is that the immune system can be desensitized to  specific allergens that trigger allergy symptoms. Although it requires time and patience, the payback can be long-term relief.  How Do Allergy Shots Work?  Allergy shots work much like a vaccine. Your body responds to injected amounts of a particular allergen given in increasing doses, eventually developing a resistance and tolerance to it. Allergy shots can lead to decreased, minimal or no allergy symptoms.  There generally are two phases: build-up and maintenance. Build-up often ranges from three to six months and involves receiving injections with increasing amounts of the allergens. The shots are typically given once or twice a week, though more rapid build-up schedules are sometimes used.  The maintenance phase begins when the most effective dose is reached. This dose is different for each person, depending on how allergic you are and your response to the build-up injections. Once the maintenance dose is reached, there are longer  periods between injections, typically two to four weeks.  Occasionally doctors give cortisone-type shots that can temporarily reduce allergy symptoms. These types of shots are different and should not be confused with allergy immunotherapy shots.  Who Can Be Treated with Allergy Shots?  Allergy shots may be a good treatment approach for people with allergic rhinitis (hay fever), allergic asthma, conjunctivitis (eye allergy) or stinging insect allergy.   Before deciding to begin allergy shots, you should consider:  . The length of allergy season and the severity of your symptoms . Whether medications and/or changes to your environment can control your symptoms . Your desire to avoid long-term medication use . Time: allergy immunotherapy requires a major time commitment . Cost: may vary depending on your insurance coverage  Allergy shots for children age 56five and older are effective and often well tolerated. They might prevent the onset of new allergen  sensitivities or the progression to asthma.  Allergy shots are not started on patients who are pregnant but can be continued on patients who become pregnant while receiving them. In some patients with other medical conditions or who take certain common medications, allergy shots may be of risk. It is important to mention other medications you talk to your allergist.   When Will I Feel Better?  Some may experience decreased allergy symptoms during the build-up phase. For others, it may take as long as 12 months on the maintenance dose. If there is no improvement after a year of maintenance, your allergist will discuss other treatment options with you.  If you aren't responding to allergy shots, it may be because there is not enough dose of the allergen in your vaccine or there are missing allergens that were not identified during your allergy testing. Other reasons could be that there are high levels of the allergen in your environment or major exposure to non-allergic triggers like tobacco smoke.  What Is the Length of Treatment?  Once the maintenance dose is reached, allergy shots are generally continued for three to five years. The decision to stop should be discussed with your allergist at that time. Some people may experience a permanent reduction of allergy symptoms. Others may relapse and a longer course of allergy shots can be considered.  What Are the Possible Reactions?  The two types of adverse reactions that can occur with allergy shots are local and systemic. Common local reactions include very mild redness and swelling at the injection site, which can happen immediately or several hours after. A systemic reaction, which is less common, affects the entire body or a particular body system. They are usually mild and typically respond quickly to medications. Signs include increased allergy symptoms such as sneezing, a stuffy nose or hives.  Rarely, a serious systemic reaction called  anaphylaxis can develop. Symptoms include swelling in the throat, wheezing, a feeling of tightness in the chest, nausea or dizziness. Most serious systemic reactions develop within 30 minutes of allergy shots. This is why it is strongly recommended you wait in your doctor's office for 30 minutes after your injections. Your allergist is trained to watch for reactions, and his or her staff is trained and equipped with the proper medications to identify and treat them.  Who Should Administer Allergy Shots?  The preferred location for receiving shots is your prescribing allergist's office. Injections can sometimes be given at another facility where the physician and staff are trained to recognize and treat reactions, and have received instructions by your prescribing allergist.

## 2018-06-18 NOTE — Progress Notes (Signed)
NEW PATIENT  Date of Service/Encounter:  06/18/18  Referring provider: [facility], [facility]   Assessment:   Mild persistent asthma, uncomplicated  Seasonal and perennial allergic rhinitis (rabbit, grasses, ragweed, trees, indoor molds, outdoor molds, dust mites, cat, dog and cockroach)  Gustatory rhinitis  Plan/Recommendations:   1. Mild persistent asthma, uncomplicated -With how often you are needing her rescue inhaler as well as her nighttime awakenings, I think it would be a good idea to start a daily inhaled steroid. - Spacer sample and demonstration provided. - Daily controller medication(s): Asmanex 100mcg 1 puffs twice daily - Prior to physical activity: albuterol 2 puffs 10-15 minutes before physical activity. - Rescue medications: albuterol 4 puffs every 4-6 hours as needed - Changes during respiratory infections or worsening symptoms: Increase Asmanex to 2 puffs twice daily for ONE TO TWO WEEKS. - Asthma control goals:  * Full participation in all desired activities (may need albuterol before activity) * Albuterol use two time or less a week on average (not counting use with activity) * Cough interfering with sleep two time or less a month * Oral steroids no more than once a year * No hospitalizations  2. Seasonal and perennial allergic rhinitis - Testing today showed: rabbit, grasses, ragweed, trees, indoor molds, outdoor molds, dust mites, cat, dog and cockroach - Copy of test results provided.  - Avoidance measures provided. - Start taking: Xyzal (levocetirizine) 5mg  tablet once daily, Nasacort (triamcinolone) two sprays per nostril daily and Astelin (azelastine) 2 sprays per nostril 1-2 times daily as needed - You can use an extra dose of the antihistamine, if needed, for breakthrough symptoms.  - Consider nasal saline rinses 1-2 times daily to remove allergens from the nasal cavities as well as help with mucous clearance (this is especially helpful  to do before the nasal sprays are given) - Consider allergy shots as a means of long-term control. - Allergy shots "re-train" and "reset" the immune system to ignore environmental allergens and decrease the resulting immune response to those allergens (sneezing, itchy watery eyes, runny nose, nasal congestion, etc).    - Allergy shots improve symptoms in 75-85% of patients.  - We can discuss more at the next appointment if the medications are not working for you.  3. Gustatory rhinitis - The regular use of the Nasacort could help with the rhinorrhea during eating. - This is not indicative of a food allergy.  4. Return in about 3 months (around 09/18/2018). This can be an in-person, a virtual Webex or a telephone follow up visit.   Subjective:   Mariah Burnett is a 43 y.o. female presenting today for evaluation of  Chief Complaint  Patient presents with  . Allergies  . Asthma  . Urticaria    Mariah Burnett has a history of the following: Patient Active Problem List   Diagnosis Date Noted  . Shortness of breath 09/25/2010  . Palpitation   . MVP (mitral valve prolapse)   . Scoliosis     History obtained from: chart review and patient.  Mariah Burnett [facility], [facility].     Mariah Burnett is a 43 y.o. female presenting for an evaluation of environmental allerges. She reports that her symptoms worsened acutely when they adopted a rabbit at the beginning of May 2020. Since the rabbit   Asthma/Respiratory Symptom History: She also had asthma when she was younger. She reports that was in the NICU for a couple of months when she was born. Symptoms  have been worse over the last couple of weeks. She does have a rescue inhaler that she uses. She tells me that she has been using the recue inhaler once per week on average. It does provide relief of her symptoms. She has been on Singulair in the distant past. She has Proventil now. She has taken prednisone for her breathing  last two years ago. She is unsure of the trigger at that time. She had a sensation in her throat but an endoscopy was normal.   Allergic Rhinitis Symptom History: She was on allergy shots as a child. She seemed to be allergic to "everything". She does note than when she cuts grass, she develops rhinorrhea and sneezing. She has tried Claritin, Zyrtec, and Allegra without improvement in her symptoms. She did try Flonase which did help when she first received it. But it caused nose bleeds. Benadryl makes her sleepy.  Food Allergy Symptom History: She tells me that she has rhinorrhea when eats anything at all. She has tried a cariety of foods.  Otherwise, there is no history of other atopic diseases, including food allergies, drug allergies, stinging insect allergies, eczema, urticaria or contact dermatitis. There is no significant infectious history. Vaccinations are up to date.    Past Medical History: Patient Active Problem List   Diagnosis Date Noted  . Shortness of breath 09/25/2010  . Palpitation   . MVP (mitral valve prolapse)   . Scoliosis     Medication List:  Allergies as of 06/18/2018      Reactions   Latex Rash      Medication List       Accurate as of June 18, 2018  4:27 PM. If you have any questions, ask your nurse or doctor.        albuterol 108 (90 Base) MCG/ACT inhaler Commonly known as: VENTOLIN HFA Inhale 2 puffs into the lungs every 6 (six) hours as needed for wheezing or shortness of breath.   cholecalciferol 25 MCG (1000 UT) tablet Commonly known as: VITAMIN D3 Take 1,000 Units by mouth daily.   metoprolol tartrate 25 MG tablet Commonly known as: LOPRESSOR Take 25 mg by mouth 2 (two) times daily.   metroNIDAZOLE 0.75 % vaginal gel Commonly known as: METROGEL VAGINAL Nightly x 5 nights   multivitamin tablet Take 1 tablet by mouth daily.   omeprazole 20 MG capsule Commonly known as: PRILOSEC TAKE 1 CAPSULE (20 MG TOTAL) BY MOUTH DAILY.        Birth History: non-contributory  Developmental History: non-contributory  Past Surgical History: Past Surgical History:  Procedure Laterality Date  . KIDNEY STONE SURGERY Bilateral 2000  . WISDOM TOOTH EXTRACTION Bilateral 1991     Family History: Family History  Problem Relation Age of Onset  . Heart disease Mother   . Heart attack Mother   . Breast cancer Mother   . Urticaria Mother   . Cancer Father   . Cancer Paternal Grandfather   . Emphysema Paternal Grandfather      Social History: Palmira lives at home with her boyfriend and her boyfriend's daughter.  They live in a house that is 80 years old.  There is carpeting throughout the home.  They have a cat and a rabbit in the home.  They are thinking about getting a dog.  There are no dust mite covers on the bedding.  There is no tobacco exposure.  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  Negative for fever, malaise/fatigue and weight loss.  HENT: Positive for congestion. Negative for ear discharge, ear pain, sinus pain and sore throat.        Positive for rhinorrhea during meals.  Positive for sneezing.  Eyes: Negative for pain, discharge and redness.  Respiratory: Positive for cough and shortness of breath. Negative for sputum production and wheezing.   Cardiovascular: Negative.  Negative for chest pain and palpitations.  Gastrointestinal: Negative for abdominal pain, constipation, diarrhea, heartburn, nausea and vomiting.  Skin: Negative.  Negative for itching and rash.  Neurological: Negative for dizziness and headaches.  Endo/Heme/Allergies: Negative for environmental allergies. Does not bruise/bleed easily.       Objective:   Blood pressure 114/70, pulse 72, temperature 98.5 F (36.9 C), temperature source Temporal, resp. rate 18, height 5' 7.5" (1.715 m), weight 137 lb 12.8 oz (62.5 kg), SpO2 97 %. Body mass index is  21.26 kg/m.   Physical Exam:   Physical Exam  Constitutional: She appears well-developed.  Very pleasant talkative female.   HENT:  Head: Normocephalic and atraumatic.  Right Ear: Tympanic membrane, external ear and ear canal normal. No drainage, swelling or tenderness. Tympanic membrane is not injected, not scarred, not erythematous, not retracted and not bulging.  Left Ear: Tympanic membrane, external ear and ear canal normal. No drainage, swelling or tenderness. Tympanic membrane is not injected, not scarred, not erythematous, not retracted and not bulging.  Nose: Mucosal edema and rhinorrhea present. No nasal deformity or septal deviation. No epistaxis. Right sinus exhibits no maxillary sinus tenderness and no frontal sinus tenderness. Left sinus exhibits no maxillary sinus tenderness and no frontal sinus tenderness.  Mouth/Throat: Uvula is midline and oropharynx is clear and moist. Mucous membranes are not pale and not dry.  She does have marked cobblestoning in the posterior oropharynx.   Eyes: Pupils are equal, round, and reactive to light. Conjunctivae and EOM are normal. Right eye exhibits no chemosis and no discharge. Left eye exhibits no chemosis and no discharge. Right conjunctiva is not injected. Left conjunctiva is not injected.  Allergic shiners bilaterally.   Cardiovascular: Normal rate, regular rhythm and normal heart sounds.  Respiratory: Effort normal and breath sounds normal. No accessory muscle usage. No tachypnea. No respiratory distress. She has no wheezes. She has no rhonchi. She has no rales. She exhibits no tenderness.  Moving air well in all lung fields.   GI: There is no abdominal tenderness. There is no rebound and no guarding.  Lymphadenopathy:       Head (right side): No submandibular, no tonsillar and no occipital adenopathy present.       Head (left side): No submandibular, no tonsillar and no occipital adenopathy present.    She has no cervical adenopathy.   Neurological: She is alert.  Skin: No abrasion, no petechiae and no rash noted. Rash is not papular, not vesicular and not urticarial. No erythema. No pallor.  No urticarial lesions noted.   Psychiatric: She has a normal mood and affect.     Diagnostic studies:   Allergy Studies:    Airborne Adult Perc - 06/18/18 1512    Time Antigen Placed  1512    Allergen Manufacturer  Waynette ButteryGreer    Location  Back    Number of Test  60    1. Control-Buffer 50% Glycerol  Negative    2. Control-Histamine 1 mg/ml  2+    3. Albumin saline  Negative    4.  Bahia  Negative    5. [place]  Negative    6. Johnson  3+    7. Kentucky Blue  3+    8. Meadow Fescue  3+    9. Perennial Rye  3+    10. Sweet Vernal  Negative    11. Timothy  3+    12. Cocklebur  Negative    13. Burweed Marshelder  Negative    14. Ragweed, short  Negative    15. Ragweed, Giant  Negative    16. Plantain,  English  Negative    17. Lamb's Quarters  Negative    18. Sheep Sorrell  Negative    19. Rough Pigweed  Negative    20. Marsh Elder, Rough  Negative    21. Mugwort, Common  Negative    22. Ash mix  Negative    23. Birch mix  Negative    24. Beech American  Negative    25. Box, Elder  Negative    26. Cedar, red  3+    27. Cottonwood, Guinea-Biss[place]astern  Negative    28. Elm mix  Negative    29. Hickory mix  Negative    30. Maple mix  2+    31. Oak, Guinea-Bissau[place]astern mix  Negative    32. Pecan Pollen  Negative    33. Pine mix  Negative    34. Sycamore Eastern  Negative    35. Walnut, Black Pollen  2+    36. Alternaria alternata  3+    37. Cladosporium Herbarum  Negative    38. Aspergillus mix  Negative    39. Penicillium mix  Negative    40. Bipolaris sorokiniana (Helminthosporium)  Negative    41. Drechslera spicifera (Curvularia)  Negative    42. Mucor plumbeus  Negative    43. Fusarium moniliforme  Negative    44. Aureobasidium pullulans (pullulara)  Negative    45. Rhizopus oryzae  Negative    46. Botrytis cinera  Negative    47.  Epicoccum nigrum  Negative    48. Phoma betae  Negative    49. Candida Albicans  Negative    50. Trichophyton mentagrophytes  Negative    51. Mite, D Farinae  5,000 AU/ml  Negative    52. Mite, D Pteronyssinus  5,000 AU/ml  Negative    53. Cat Hair 10,000 BAU/ml  3+    54.  Dog Epithelia  Negative    55. Mixed Feathers  Negative    56. Horse Epithelia  Negative    57. Cockroach, German  3+    58. Mouse  Negative    59. Tobacco Leaf  Negative    Other  3+   Rabbitt    Intradermal - 06/18/18 1550    Time Antigen Placed  1551    Allergen Manufacturer  Waynette ButteryGreer    Location  Arm    Number of Test  8    Intradermal  Select    Control  Negative    Ragweed mix  2+    Weed mix  Negative    Mold 2  Negative    Mold 3  Negative    Mold 4  2+    Dog  2+    Mite mix  1+       Allergy testing results were read and interpreted by myself, documented by clinical staff.         Malachi BondsJoel Andriea Hasegawa, MD [facility]

## 2018-09-12 ENCOUNTER — Other Ambulatory Visit: Payer: Self-pay | Admitting: Allergy & Immunology

## 2018-09-17 ENCOUNTER — Other Ambulatory Visit: Payer: Self-pay | Admitting: Cardiovascular Disease

## 2018-09-17 ENCOUNTER — Encounter: Payer: Self-pay | Admitting: Allergy & Immunology

## 2018-09-17 ENCOUNTER — Other Ambulatory Visit: Payer: Self-pay

## 2018-09-17 ENCOUNTER — Ambulatory Visit (INDEPENDENT_AMBULATORY_CARE_PROVIDER_SITE_OTHER): Payer: BC Managed Care – PPO | Admitting: Allergy & Immunology

## 2018-09-17 VITALS — BP 100/60 | HR 56 | Temp 98.8°F | Resp 14 | Ht 65.75 in | Wt 140.2 lb

## 2018-09-17 DIAGNOSIS — J310 Chronic rhinitis: Secondary | ICD-10-CM | POA: Diagnosis not present

## 2018-09-17 DIAGNOSIS — J302 Other seasonal allergic rhinitis: Secondary | ICD-10-CM

## 2018-09-17 DIAGNOSIS — J3089 Other allergic rhinitis: Secondary | ICD-10-CM | POA: Diagnosis not present

## 2018-09-17 DIAGNOSIS — J4530 Mild persistent asthma, uncomplicated: Secondary | ICD-10-CM

## 2018-09-17 NOTE — Progress Notes (Signed)
FOLLOW UP  Date of Service/Encounter:  09/17/18   Assessment:   Mild persistent asthma, uncomplicated  Seasonal and perennial allergic rhinitis (rabbit, grasses, ragweed, trees, indoor molds, outdoor molds, dust mites, cat, dog and cockroach) - wishing to start allergen immunotherapy  Gustatory rhinitis  Plan/Recommendations:   1. Mild persistent asthma, uncomplicated - Lung function looked excellent today. - Try using the albuterol (ProAir) the next time that you have this chest tightness. - Give [place] an update when you try this.  - Daily controller medication(s): Asmanex 1 puffs twice daily - Prior to physical activity: albuterol 2 puffs 10-15 minutes before physical activity. - Rescue medications: albuterol 4 puffs every 4-6 hours as needed - Changes during respiratory infections or worsening symptoms: Increase Asmanex to 2 puffs twice daily for ONE TO TWO WEEKS. - Asthma control goals:  * Full participation in all desired activities (may need albuterol before activity) * Albuterol use two time or less a week on average (not counting use with activity) * Cough interfering with sleep two time or less a month * Oral steroids no more than once a year * No hospitalizations  2. Seasonal and perennial allergic rhinitis (rabbit, grasses, ragweed, trees, indoor molds, outdoor molds, dust mites, cat, dog and cockroach) - It seems that everything is under good control. - We will send in the order for allergy shots.  - Continue taking: Xyzal (levocetirizine) 5mg  tablet once daily, Nasacort (triamcinolone) two sprays per nostril daily and Astelin (azelastine) 2 sprays per nostril 1-2 times daily as needed - You can use an extra dose of the antihistamine, if needed, for breakthrough symptoms.  - Consider nasal saline rinses 1-2 times daily to remove allergens from the nasal cavities as well as help with mucous clearance (this is especially helpful to do before the nasal sprays are  given)  3. Return in about 6 months (around 03/17/2019). This can be an in-person, a virtual Webex or a telephone follow up visit.   Subjective:   Mariah Burnett is a 43 y.o. female presenting today for follow up of  Chief Complaint  Patient presents with  . Asthma    Asmanex seems to make pt take deep breaths    Mariah Burnett has a history of the following: Patient Active Problem List   Diagnosis Date Noted  . Shortness of breath 09/25/2010  . Palpitation   . MVP (mitral valve prolapse)   . Scoliosis     History obtained from: chart review and patient.  Leeyah is a 43 y.o. female presenting for a follow up visit.  She was last seen in June 2020.  At that time, she was needing her rescue inhaler often so we added on Asmanex 100 mcg 1 puff twice daily.  For her rhinitis, we started Xyzal, Nasacort, and Astelin.  She was allergic to rabbit, grasses, ragweed, trees, indoor and outdoor molds, dust mite, cat, dog, and cockroach.  Since last visit, she has continued to do well. The addition of the ICS has improved her endurance and has resolved her cough. However, over the last week she has had tightness in the chest area for one week. She reports period of chest tightness that last for 20 minutes and then subsidies. She has not tried albuterol but she has noticed that if she takes several deep breaths.   Of note, she does have a history of heart palpitations. She was placed on a beta blocker when she was in her 36s or so, currently  on Lopressor 12.5mg  BID. 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 Prentice DockerSuresh Koneswaran at [facility]. 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   Asthma/Respiratory Symptom History: She has tolerated the Asmanex without a problem. It has resolved the use of her albuterol. However it should be noted that she is not as physically active as she was before. Aside from the chest tightness over the last week, she has done  well. She has not required any prednisone or ED visits for her symptoms.  Allergic Rhinitis Symptom History: She is on her nasal sprays and these are working well to control her allergiues. She is on her antihistamine as well. She checked with her insurance company and the allergy shots would be covered at 100%, and she is interested in starting them as a means of getting off of her other medications in time.  Otherwise, there have been no changes to her past medical history, surgical history, family history, or social history.    Review of Systems  Constitutional: Negative.  Negative for chills, fever, malaise/fatigue and weight loss.  HENT: Negative.  Negative for congestion, ear discharge, ear pain, nosebleeds and sore throat.   Eyes: Negative for pain, discharge and redness.  Respiratory: Negative for cough, sputum production, shortness of breath and wheezing.   Cardiovascular: Negative.  Negative for chest pain and palpitations.  Gastrointestinal: Negative for abdominal pain, constipation, diarrhea, heartburn, nausea and vomiting.  Skin: Negative.  Negative for itching and rash.  Neurological: Negative for dizziness and headaches.  Endo/Heme/Allergies: Negative for environmental allergies. Does not bruise/bleed easily.       Objective:   Blood pressure 100/60, pulse (!) 56, temperature 98.8 F (37.1 C), temperature source Temporal, resp. rate 14, height 5' 5.75" (1.67 m), weight 140 lb 3.2 oz (63.6 kg), SpO2 98 %. Body mass index is 22.8 kg/m.   Physical Exam:  Physical Exam  Constitutional: She appears well-developed.  Very pleasant female. Talkative and smiling.   HENT:  Head: Normocephalic and atraumatic.  Right Ear: Tympanic membrane, external ear and ear canal normal.  Left Ear: Tympanic membrane, external ear and ear canal normal.  Nose: Mucosal edema and rhinorrhea present. No nasal deformity or septal deviation. No epistaxis. Right sinus exhibits no maxillary sinus  tenderness and no frontal sinus tenderness. Left sinus exhibits no maxillary sinus tenderness and no frontal sinus tenderness.  Mouth/Throat: Uvula is midline and oropharynx is clear and moist. Mucous membranes are not pale and not dry.  There is some clear rhinorrhea in the left nares, but overall she exam is much better tan when I saw her in June.   Eyes: Pupils are equal, round, and reactive to light. Conjunctivae and EOM are normal. Right eye exhibits no chemosis and no discharge. Left eye exhibits no chemosis and no discharge. Right conjunctiva is not injected. Left conjunctiva is not injected.  Cardiovascular: Normal rate, regular rhythm and normal heart sounds.  Respiratory: Effort normal and breath sounds normal. No accessory muscle usage. No tachypnea. No respiratory distress. She has no wheezes. She has no rhonchi. She has no rales. She exhibits no tenderness.  Moving air well in all lung fields.   Lymphadenopathy:    She has no cervical adenopathy.  Neurological: She is alert.  Skin: No abrasion, no petechiae and no rash noted. Rash is not papular, not vesicular and not urticarial. No erythema. No pallor.  Psychiatric: She has a normal mood and affect.    Diagnostic  studies:    Spirometry: results normal (FEV1: 2.85/103%, FVC: 3.81/106%, FEV1/FVC: 74%).    Spirometry consistent with normal pattern.   Allergy Studies:  none       Salvatore Marvel, MD  [facility]

## 2018-09-17 NOTE — Patient Instructions (Addendum)
1. Mild persistent asthma, uncomplicated - Lung function looked excellent today. - Try using the albuterol (ProAir) the next time that you have this chest tightness. - Give [place] an update when you try this.  - Daily controller medication(s): Asmanex 132mcg 1 puffs twice daily - Prior to physical activity: albuterol 2 puffs 10-15 minutes before physical activity. - Rescue medications: albuterol 4 puffs every 4-6 hours as needed - Changes during respiratory infections or worsening symptoms: Increase Asmanex to 2 puffs twice daily for ONE TO TWO WEEKS. - Asthma control goals:  * Full participation in all desired activities (may need albuterol before activity) * Albuterol use two time or less a week on average (not counting use with activity) * Cough interfering with sleep two time or less a month * Oral steroids no more than once a year * No hospitalizations  2. Seasonal and perennial allergic rhinitis (rabbit, grasses, ragweed, trees, indoor molds, outdoor molds, dust mites, cat, dog and cockroach) - It seems that everything is under good control. - We will send in the order for allergy shots.  - Continue taking: Xyzal (levocetirizine) 5mg  tablet once daily, Nasacort (triamcinolone) two sprays per nostril daily and Astelin (azelastine) 2 sprays per nostril 1-2 times daily as needed - You can use an extra dose of the antihistamine, if needed, for breakthrough symptoms.  - Consider nasal saline rinses 1-2 times daily to remove allergens from the nasal cavities as well as help with mucous clearance (this is especially helpful to do before the nasal sprays are given)  3. Return in about 6 months (around 03/17/2019). This can be an in-person, a virtual Webex or a telephone follow up visit.   Please inform us of any Emergency Department visits, hospitalizations, or changes in symptoms. Call us before going to the ED for breathing or allergy symptoms since we might be able to fit you in for a sick  visit. Feel free to contact us anytime with any questions, problems, or concerns.  It was a pleasure to see you again today!  Websites that have reliable patient information: 1. American Academy of Asthma, Allergy, and Immunology: www.aaaai.org 2. Food Allergy Research and Education (FARE): foodallergy.org 3. Mothers of Asthmatics: http://www.asthmacommunitynetwork.org 4. American College of Allergy, Asthma, and Immunology: www.acaai.org  "Like" [place] on Facebook and Instagram for our latest updates!      Make sure you are registered to vote! If you have moved or changed any of your contact information, you will need to get this updated before voting!  In some cases, you MAY be able to register to vote online: [email or web address]    Voter ID laws are NOT going into effect for the General Election in November 2020! DO NOT let this stop you from exercising your right to vote!   Absentee voting is the SAFEST way to vote during the coronavirus pandemic!   Download and print an absentee ballot request form at [email or web address] or you can scan the QR code below with your smart phone:      More information on absentee ballots can be found here: [email or web address]

## 2018-09-18 ENCOUNTER — Encounter: Payer: Self-pay | Admitting: Allergy & Immunology

## 2018-09-21 DIAGNOSIS — J301 Allergic rhinitis due to pollen: Secondary | ICD-10-CM

## 2018-09-22 ENCOUNTER — Encounter: Payer: Self-pay | Admitting: Allergy & Immunology

## 2018-09-22 DIAGNOSIS — J3089 Other allergic rhinitis: Secondary | ICD-10-CM | POA: Diagnosis not present

## 2018-09-22 NOTE — Addendum Note (Signed)
Addended by: Valentina Shaggy on: 09/22/2018 10:40 AM   Modules accepted: Orders

## 2018-09-22 NOTE — Progress Notes (Signed)
VIALS EXP 09-22-19 

## 2018-09-23 ENCOUNTER — Encounter: Payer: Self-pay | Admitting: Cardiovascular Disease

## 2018-09-23 ENCOUNTER — Other Ambulatory Visit: Payer: Self-pay

## 2018-09-23 ENCOUNTER — Telehealth (INDEPENDENT_AMBULATORY_CARE_PROVIDER_SITE_OTHER): Payer: BC Managed Care – PPO | Admitting: Cardiovascular Disease

## 2018-09-23 VITALS — Ht 67.0 in | Wt 135.0 lb

## 2018-09-23 DIAGNOSIS — R002 Palpitations: Secondary | ICD-10-CM

## 2018-09-23 DIAGNOSIS — R0789 Other chest pain: Secondary | ICD-10-CM | POA: Diagnosis not present

## 2018-09-23 DIAGNOSIS — J45909 Unspecified asthma, uncomplicated: Secondary | ICD-10-CM

## 2018-09-23 NOTE — Progress Notes (Signed)
Virtual Visit via Telephone Note   This visit type was conducted due to national recommendations for restrictions regarding the COVID-19 Pandemic (e.g. social distancing) in an effort to limit this patient's exposure and mitigate transmission in our community.  Due to her co-morbid illnesses, this patient is at least at moderate risk for complications without adequate follow up.  This format is felt to be most appropriate for this patient at this time.  The patient did not have access to video technology/had technical difficulties with video requiring transitioning to audio format only (telephone).  All issues noted in this document were discussed and addressed.  No physical exam could be performed with this format.  Please refer to the patient's chart for her  consent to telehealth for [facility].   Date:  09/23/2018   ID:  Mariah Burnett, DOB Jan 30, 1975, MRN [id number]  Patient Location: Home Provider Location: Office  PCP:  Redmond School, MD  Cardiologist:  Kate Sable, MD  Electrophysiologist:  None   Evaluation Performed:  Follow-Up Visit  Chief Complaint:  Chest pain  History of Present Illness:    Mariah Burnett is a 43 y.o. female with palpitations.  She has been having chest pains.   She took deep breaths to alleviate them. She has had a coworker out for the past 2 weeks which has put an additional burden at work. She has been doing more physical work. It is feels "like a weight" and it lasts for about 20 minutes. She describes it as pressure. There is no radiation to the arm, back, or jaw. She does describe some flank pain and it can alternate sides. She denies dysuria. She denies leg swelling. She denies dizziness and lightheadedness. She sees "small light flashes" when the episodes occur. She denies blurry vision. She never gets headaches but she had one earlier this week.  She is engaged now and is planning a wedding with her fiance, Rick Duff. This has caused a lot of  stress as well. It is set for October 24th. This is her second marriage.  She denies increased palpitations.  The patient does not have symptoms concerning for COVID-19 infection (fever, chills, cough, or new shortness of breath).    Past Medical History:  Diagnosis Date  . Asthma   . MVP (mitral valve prolapse)    Although no evidence per echo in 2009. Trace MR  . Palpitation   . Scoliosis   . Urticaria    Past Surgical History:  Procedure Laterality Date  . KIDNEY STONE SURGERY Bilateral 2000  . WISDOM TOOTH EXTRACTION Bilateral 1991     Current Meds  Medication Sig  . albuterol (VENTOLIN HFA) 108 (90 Base) MCG/ACT inhaler Inhale 2 puffs into the lungs every 6 (six) hours as needed for wheezing or shortness of breath.  Marland Kitchen azelastine (ASTELIN) 0.1 % nasal spray PLACE 2 SPRAYS INTO BOTH NOSTRILS 2 (TWO) TIMES DAILY AS NEEDED FOR RHINITIS.  Marland Kitchen cholecalciferol (VITAMIN D3) 25 MCG (1000 UT) tablet Take 1,000 Units by mouth daily.  Marland Kitchen levocetirizine (XYZAL) 5 MG tablet Take 1 tablet (5 mg total) by mouth every evening.  . metoprolol tartrate (LOPRESSOR) 25 MG tablet TAKE HALF A TABLET BY MOUTH TWICE A DAY  . Mometasone Furoate (ASMANEX HFA) 100 MCG/ACT AERO Inhale 1 puff into the lungs 2 (two) times a day.  . Multiple Vitamin (MULTIVITAMIN) tablet Take 1 tablet by mouth daily.     Allergies:   Latex   Social History   Tobacco Use  .  Smoking status: Never Smoker  . Smokeless tobacco: Never Used  Substance Use Topics  . Alcohol use: Yes    Comment: occ  . Drug use: No     Family Hx: The patient's family history includes Breast cancer in her mother; Cancer in her father and paternal grandfather; Emphysema in her paternal grandfather; Heart attack in her mother; Heart disease in her mother; Urticaria in her mother.  ROS:   Please see the history of present illness.     All other systems reviewed and are negative.   Prior CV studies:   The following studies were reviewed  today:  NA  Labs/Other Tests and Data Reviewed:    EKG:  No ECG reviewed.  Recent Labs: No results found for requested labs within last 8760 hours.   Recent Lipid Panel Lab Results  Component Value Date/Time   CHOL 179 04/27/2018 03:12 PM   TRIG 95 04/27/2018 03:12 PM   HDL 61 04/27/2018 03:12 PM   CHOLHDL 2.9 04/27/2018 03:12 PM   CHOLHDL 3 09/25/2010 08:37 AM   LDLCALC 99 04/27/2018 03:12 PM    Wt Readings from Last 3 Encounters:  09/23/18 135 lb (61.2 kg)  09/17/18 140 lb 3.2 oz (63.6 kg)  06/18/18 137 lb 12.8 oz (62.5 kg)     Objective:    Vital Signs:  Ht 5\' 7"  (1.702 m)   Wt 135 lb (61.2 kg)   BMI 21.14 kg/m    VITAL SIGNS:  reviewed  ASSESSMENT & PLAN:    1.  Chest pressure: She has had more work-related stress over the past 2 weeks due to the absence of a coworker.  She is also planning a wedding for October 24.  I suspect her symptoms are related to increased anxiety and stress.  I do not feel stress testing is indicated at this time.  I will continue to monitor her symptoms.  2.  Palpitations: Symptomatically stable on metoprolol tartrate 12.5 mg twice daily.  No changes to therapy.  3.  Asthma: Symptoms have been exacerbated by wearing a facemask.  Her work has permitted her to wear a face shield so long as she has a doctor's note.  I will provide her with this.   COVID-19 Education: The signs and symptoms of COVID-19 were discussed with the patient and how to seek care for testing (follow up with PCP or arrange E-visit).  The importance of social distancing was discussed today.  Time:   Today, I have spent 25 minutes with the patient with telehealth technology discussing the above problems.     Medication Adjustments/Labs and Tests Ordered: Current medicines are reviewed at length with the patient today.  Concerns regarding medicines are outlined above.   Tests Ordered: No orders of the defined types were placed in this encounter.   Medication  Changes: No orders of the defined types were placed in this encounter.   Follow Up:  In Person as scheduled on 10/9  Signed, Prentice DockerSuresh , MD  09/23/2018 3:52 PM    [facility]

## 2018-09-23 NOTE — Patient Instructions (Addendum)
Medication Instructions:   Your physician recommends that you continue on your current medications as directed. Please refer to the Current Medication list given to you today.  Labwork:  NONE  Testing/Procedures:  NONE  Follow-Up:  Your physician recommends that you schedule a follow-up appointment in: as planned on 10/15/2018.  Any Other Special Instructions Will Be Listed Below (If Applicable).  If you need a refill on your cardiac medications before your next appointment, please call your pharmacy.

## 2018-09-24 ENCOUNTER — Encounter: Payer: Self-pay | Admitting: *Deleted

## 2018-10-01 ENCOUNTER — Other Ambulatory Visit: Payer: Self-pay

## 2018-10-01 ENCOUNTER — Ambulatory Visit (INDEPENDENT_AMBULATORY_CARE_PROVIDER_SITE_OTHER): Payer: BC Managed Care – PPO | Admitting: *Deleted

## 2018-10-01 DIAGNOSIS — J309 Allergic rhinitis, unspecified: Secondary | ICD-10-CM

## 2018-10-01 MED ORDER — EPINEPHRINE 0.3 MG/0.3ML IJ SOAJ: 0.3000 mg | Freq: Once | INTRAMUSCULAR | 1 refills | Status: AC

## 2018-10-01 NOTE — Progress Notes (Signed)
Immunotherapy   Patient Details  Name: Mariah Burnett MRN: [id number] Date of Birth: Mar 10, 1975  10/01/2018  Patient came to [place] office to start allergy injections.  Blue Vials, 1:100,000 with Expiration date of 09/22/19.  Grass-Tree-Cat-Dog-Rabbit and RW-Molds-CR-DM.  Patient received 0.05 ml of each vial. Following schedule: B  Frequency: Once a Week Epi-Pen: RX sent to [facility] on [address] in [place]. Patient was instructed on proper use of Epi-pen. Consent signed and patient instructions given. Patient waited in office for 30 minutes after injection with only complaint of dry eyes.   [name] 10/01/2018, 4:13 PM

## 2018-10-07 ENCOUNTER — Other Ambulatory Visit: Payer: Self-pay | Admitting: Allergy & Immunology

## 2018-10-08 ENCOUNTER — Ambulatory Visit (INDEPENDENT_AMBULATORY_CARE_PROVIDER_SITE_OTHER): Payer: BC Managed Care – PPO

## 2018-10-08 DIAGNOSIS — J309 Allergic rhinitis, unspecified: Secondary | ICD-10-CM | POA: Diagnosis not present

## 2018-10-15 ENCOUNTER — Ambulatory Visit: Payer: BLUE CROSS/BLUE SHIELD | Admitting: Cardiovascular Disease

## 2018-10-20 ENCOUNTER — Ambulatory Visit (INDEPENDENT_AMBULATORY_CARE_PROVIDER_SITE_OTHER): Payer: BC Managed Care – PPO

## 2018-10-20 DIAGNOSIS — J309 Allergic rhinitis, unspecified: Secondary | ICD-10-CM | POA: Diagnosis not present

## 2018-10-27 ENCOUNTER — Ambulatory Visit (INDEPENDENT_AMBULATORY_CARE_PROVIDER_SITE_OTHER): Payer: BC Managed Care – PPO

## 2018-10-27 DIAGNOSIS — J309 Allergic rhinitis, unspecified: Secondary | ICD-10-CM

## 2018-11-10 ENCOUNTER — Ambulatory Visit (INDEPENDENT_AMBULATORY_CARE_PROVIDER_SITE_OTHER): Payer: BC Managed Care – PPO

## 2018-11-10 DIAGNOSIS — J309 Allergic rhinitis, unspecified: Secondary | ICD-10-CM | POA: Diagnosis not present

## 2018-11-17 ENCOUNTER — Ambulatory Visit (INDEPENDENT_AMBULATORY_CARE_PROVIDER_SITE_OTHER): Payer: BC Managed Care – PPO

## 2018-11-17 DIAGNOSIS — J309 Allergic rhinitis, unspecified: Secondary | ICD-10-CM | POA: Diagnosis not present

## 2018-11-26 ENCOUNTER — Ambulatory Visit (INDEPENDENT_AMBULATORY_CARE_PROVIDER_SITE_OTHER): Payer: BC Managed Care – PPO

## 2018-11-26 DIAGNOSIS — J309 Allergic rhinitis, unspecified: Secondary | ICD-10-CM

## 2018-12-08 ENCOUNTER — Ambulatory Visit (INDEPENDENT_AMBULATORY_CARE_PROVIDER_SITE_OTHER): Payer: BC Managed Care – PPO

## 2018-12-08 DIAGNOSIS — J309 Allergic rhinitis, unspecified: Secondary | ICD-10-CM | POA: Diagnosis not present

## 2018-12-22 ENCOUNTER — Ambulatory Visit (INDEPENDENT_AMBULATORY_CARE_PROVIDER_SITE_OTHER): Payer: BC Managed Care – PPO | Admitting: *Deleted

## 2018-12-22 DIAGNOSIS — J309 Allergic rhinitis, unspecified: Secondary | ICD-10-CM

## 2018-12-25 ENCOUNTER — Other Ambulatory Visit: Payer: Self-pay | Admitting: Allergy & Immunology

## 2018-12-29 ENCOUNTER — Ambulatory Visit (INDEPENDENT_AMBULATORY_CARE_PROVIDER_SITE_OTHER): Payer: BC Managed Care – PPO

## 2018-12-29 DIAGNOSIS — J309 Allergic rhinitis, unspecified: Secondary | ICD-10-CM

## 2019-01-05 ENCOUNTER — Other Ambulatory Visit: Payer: Self-pay

## 2019-01-05 MED ORDER — LEVOCETIRIZINE DIHYDROCHLORIDE 5 MG PO TABS: ORAL_TABLET | ORAL | 0 refills | Status: DC

## 2019-01-21 ENCOUNTER — Ambulatory Visit (INDEPENDENT_AMBULATORY_CARE_PROVIDER_SITE_OTHER): Payer: BC Managed Care – PPO

## 2019-01-21 DIAGNOSIS — J309 Allergic rhinitis, unspecified: Secondary | ICD-10-CM | POA: Diagnosis not present

## 2019-01-26 ENCOUNTER — Ambulatory Visit (INDEPENDENT_AMBULATORY_CARE_PROVIDER_SITE_OTHER): Payer: BC Managed Care – PPO

## 2019-01-26 DIAGNOSIS — J309 Allergic rhinitis, unspecified: Secondary | ICD-10-CM | POA: Diagnosis not present

## 2019-02-02 ENCOUNTER — Ambulatory Visit (INDEPENDENT_AMBULATORY_CARE_PROVIDER_SITE_OTHER): Payer: BC Managed Care – PPO

## 2019-02-02 DIAGNOSIS — J309 Allergic rhinitis, unspecified: Secondary | ICD-10-CM

## 2019-02-09 ENCOUNTER — Ambulatory Visit (INDEPENDENT_AMBULATORY_CARE_PROVIDER_SITE_OTHER): Payer: BC Managed Care – PPO

## 2019-02-09 DIAGNOSIS — J309 Allergic rhinitis, unspecified: Secondary | ICD-10-CM | POA: Diagnosis not present

## 2019-02-16 ENCOUNTER — Ambulatory Visit (INDEPENDENT_AMBULATORY_CARE_PROVIDER_SITE_OTHER): Payer: BC Managed Care – PPO

## 2019-02-16 DIAGNOSIS — J309 Allergic rhinitis, unspecified: Secondary | ICD-10-CM | POA: Diagnosis not present

## 2019-02-23 ENCOUNTER — Ambulatory Visit (INDEPENDENT_AMBULATORY_CARE_PROVIDER_SITE_OTHER): Payer: BC Managed Care – PPO

## 2019-02-23 DIAGNOSIS — J309 Allergic rhinitis, unspecified: Secondary | ICD-10-CM | POA: Diagnosis not present

## 2019-03-02 ENCOUNTER — Ambulatory Visit (INDEPENDENT_AMBULATORY_CARE_PROVIDER_SITE_OTHER): Payer: BC Managed Care – PPO

## 2019-03-02 DIAGNOSIS — J309 Allergic rhinitis, unspecified: Secondary | ICD-10-CM

## 2019-03-09 ENCOUNTER — Ambulatory Visit (INDEPENDENT_AMBULATORY_CARE_PROVIDER_SITE_OTHER): Payer: BC Managed Care – PPO

## 2019-03-09 DIAGNOSIS — J309 Allergic rhinitis, unspecified: Secondary | ICD-10-CM

## 2019-03-11 ENCOUNTER — Telehealth: Payer: Self-pay

## 2019-03-11 NOTE — Telephone Encounter (Signed)
Virtual Visit Pre-Appointment Phone Call  "(Name), I am calling you today to discuss your upcoming appointment. We are currently trying to limit exposure to the virus that causes COVID-19 by seeing patients at home rather than in the office."  1. "What is the BEST phone number to call the day of the visit?" - include this in appointment notes  2. "Do you have or have access to (through a family member/friend) a smartphone with video capability that we can use for your visit?" a. If yes - list this number in appt notes as "cell" (if different from BEST phone #) and list the appointment type as a VIDEO visit in appointment notes b. If no - list the appointment type as a PHONE visit in appointment notes  3. Confirm consent - "In the setting of the current Covid19 crisis, you are scheduled for a (phone or video) visit with your provider on (date) at (time).  Just as we do with many in-office visits, in order for you to participate in this visit, we must obtain consent.  If you'd like, I can send this to your mychart (if signed up) or email for you to review.  Otherwise, I can obtain your verbal consent now.  All virtual visits are billed to your insurance company just like a normal visit would be.  By agreeing to a virtual visit, we'd like you to understand that the technology does not allow for your provider to perform an examination, and thus may limit your provider's ability to fully assess your condition. If your provider identifies any concerns that need to be evaluated in person, we will make arrangements to do so.  Finally, though the technology is pretty good, we cannot assure that it will always work on either your or our end, and in the setting of a video visit, we may have to convert it to a phone-only visit.  In either situation, we cannot ensure that we have a secure connection.  Are you willing to proceed?" STAFF: Did the patient verbally acknowledge consent to telehealth visit? Document  YES/NO here:   4. Advise patient to be prepared - "Two hours prior to your appointment, go ahead and check your blood pressure, pulse, oxygen saturation, and your weight (if you have the equipment to check those) and write them all down. When your visit starts, your provider will ask you for this information. If you have an Apple Watch or Kardia device, please plan to have heart rate information ready on the day of your appointment. Please have a pen and paper handy nearby the day of the visit as well."  5. Give patient instructions for MyChart download to smartphone OR Doximity/Doxy.me as below if video visit (depending on what platform provider is using)  6. Inform patient they will receive a phone call 15 minutes prior to their appointment time (may be from unknown caller ID) so they should be prepared to answer    TELEPHONE CALL NOTE  Cataleah Stites has been deemed a candidate for a follow-up tele-health visit to limit community exposure during the Covid-19 pandemic. I spoke with the patient via phone to ensure availability of phone/video source, confirm preferred email & phone number, and discuss instructions and expectations.  I reminded Anny Sayler to be prepared with any vital sign and/or heart rhythm information that could potentially be obtained via home monitoring, at the time of her visit. I reminded Arlethia Basso to expect a phone call prior to her visit.  Gracy Bruins 03/11/2019 4:14 PM   INSTRUCTIONS FOR DOWNLOADING THE MYCHART APP TO SMARTPHONE  - The patient must first make sure to have activated MyChart and know their login information - If Apple, go to [employer] and type in MyChart in the search bar and download the app. If Android, ask patient to go to [employer] and type in [place] in the search bar and download the app. The app is free but as with any other app downloads, their phone may require them to verify saved payment information or Apple/Android password.    - The patient will need to then log into the app with their MyChart username and password, and select [facility] as their healthcare provider to link the account. When it is time for your visit, go to the MyChart app, find appointments, and click Begin Video Visit. Be sure to Select Allow for your device to access the Microphone and Camera for your visit. You will then be connected, and your provider will be with you shortly.  **If they have any issues connecting, or need assistance please contact MyChart service desk [phone])**  **If using a computer, in order to ensure the best quality for their visit they will need to use either of the following Internet Browsers: [employer] or Google Chrome**  IF USING DOXIMITY or DOXY.ME - The patient will receive a link just prior to their visit by text.     FULL LENGTH CONSENT FOR TELE-HEALTH VISIT   I hereby voluntarily request, consent and authorize [facility] and its employed or contracted physicians, physician assistants, nurse practitioners or other licensed health care professionals (the Practitioner), to provide me with telemedicine health care services (the "Services") as deemed necessary by the treating Practitioner. I acknowledge and consent to receive the Services by the Practitioner via telemedicine. I understand that the telemedicine visit will involve communicating with the Practitioner through live audiovisual communication technology and the disclosure of certain medical information by electronic transmission. I acknowledge that I have been given the opportunity to request an in-person assessment or other available alternative prior to the telemedicine visit and am voluntarily participating in the telemedicine visit.  I understand that I have the right to withhold or withdraw my consent to the use of telemedicine in the course of my care at any time, without affecting my right to future care or treatment, and that  the Practitioner or I may terminate the telemedicine visit at any time. I understand that I have the right to inspect all information obtained and/or recorded in the course of the telemedicine visit and may receive copies of available information for a reasonable fee.  I understand that some of the potential risks of receiving the Services via telemedicine include:  Marland Kitchen Delay or interruption in medical evaluation due to technological equipment failure or disruption; . Information transmitted may not be sufficient (e.g. poor resolution of images) to allow for appropriate medical decision making by the Practitioner; and/or  . In rare instances, security protocols could fail, causing a breach of personal health information.  Furthermore, I acknowledge that it is my responsibility to provide information about my medical history, conditions and care that is complete and accurate to the best of my ability. I acknowledge that Practitioner's advice, recommendations, and/or decision may be based on factors not within their control, such as incomplete or inaccurate data provided by me or distortions of diagnostic images or specimens that may result from electronic transmissions. I understand that the  practice of medicine is not an Chief Strategy Officer and that Practitioner makes no warranties or guarantees regarding treatment outcomes. I acknowledge that I will receive a copy of this consent concurrently upon execution via email to the email address I last provided but may also request a printed copy by calling the office of [facility].    I understand that my insurance will be billed for this visit.   I have read or had this consent read to me. . I understand the contents of this consent, which adequately explains the benefits and risks of the Services being provided via telemedicine.  . I have been provided ample opportunity to ask questions regarding this consent and the Services and have had my questions answered to  my satisfaction. . I give my informed consent for the services to be provided through the use of telemedicine in my medical care  By participating in this telemedicine visit I agree to the above.

## 2019-03-16 ENCOUNTER — Ambulatory Visit (INDEPENDENT_AMBULATORY_CARE_PROVIDER_SITE_OTHER): Payer: BC Managed Care – PPO

## 2019-03-16 DIAGNOSIS — J309 Allergic rhinitis, unspecified: Secondary | ICD-10-CM | POA: Diagnosis not present

## 2019-03-17 ENCOUNTER — Telehealth: Payer: BC Managed Care – PPO | Admitting: Cardiovascular Disease

## 2019-03-23 ENCOUNTER — Ambulatory Visit (INDEPENDENT_AMBULATORY_CARE_PROVIDER_SITE_OTHER): Payer: BC Managed Care – PPO

## 2019-03-23 DIAGNOSIS — J309 Allergic rhinitis, unspecified: Secondary | ICD-10-CM | POA: Diagnosis not present

## 2019-03-30 ENCOUNTER — Ambulatory Visit (INDEPENDENT_AMBULATORY_CARE_PROVIDER_SITE_OTHER): Payer: BC Managed Care – PPO

## 2019-03-30 DIAGNOSIS — J309 Allergic rhinitis, unspecified: Secondary | ICD-10-CM

## 2019-04-05 ENCOUNTER — Other Ambulatory Visit: Payer: Self-pay

## 2019-04-05 ENCOUNTER — Ambulatory Visit
Admission: EM | Admit: 2019-04-05 | Discharge: 2019-04-05 | Disposition: A | Discharge status: Home / Self Care | Payer: BC Managed Care – PPO | Source: Home / Self Care

## 2019-04-05 ENCOUNTER — Emergency Department (HOSPITAL_COMMUNITY)
Admission: EM | Admit: 2019-04-05 | Discharge: 2019-04-05 | Disposition: A | Discharge status: Home / Self Care | Payer: BC Managed Care – PPO | Attending: Emergency Medicine | Admitting: Emergency Medicine

## 2019-04-05 ENCOUNTER — Encounter (HOSPITAL_COMMUNITY): Payer: Self-pay | Admitting: *Deleted

## 2019-04-05 DIAGNOSIS — Z91040 Latex allergy status: Secondary | ICD-10-CM | POA: Diagnosis not present

## 2019-04-05 DIAGNOSIS — Z79899 Other long term (current) drug therapy: Secondary | ICD-10-CM | POA: Diagnosis not present

## 2019-04-05 DIAGNOSIS — E876 Hypokalemia: Secondary | ICD-10-CM | POA: Insufficient documentation

## 2019-04-05 DIAGNOSIS — R42 Dizziness and giddiness: Secondary | ICD-10-CM

## 2019-04-05 DIAGNOSIS — J45909 Unspecified asthma, uncomplicated: Secondary | ICD-10-CM | POA: Diagnosis not present

## 2019-04-05 LAB — BASIC METABOLIC PANEL
Anion gap: 9 (ref 5–15)
BUN: 13 mg/dL (ref 6–20)
CO2: 24 mmol/L (ref 22–32)
Calcium: 9.3 mg/dL (ref 8.9–10.3)
Chloride: 103 mmol/L (ref 98–111)
Creatinine, Ser: 0.63 mg/dL (ref 0.44–1.00)
GFR calc Af Amer: >60 mL/min (ref >60)
GFR calc non Af Amer: >60 mL/min (ref >60)
Glucose, Bld: 96 mg/dL (ref 70–99)
Potassium: 3.4 mmol/L — ABNORMAL LOW (ref 3.5–5.1)
Sodium: 136 mmol/L (ref 135–145)

## 2019-04-05 LAB — CBC
HCT: 40.2 % (ref 36.0–46.0)
Hemoglobin: 13.6 g/dL (ref 12.0–15.0)
MCH: 32.2 pg (ref 26.0–34.0)
MCHC: 33.8 g/dL (ref 30.0–36.0)
MCV: 95.3 fL (ref 80.0–100.0)
Platelets: 202 10*3/uL (ref 150–400)
RBC: 4.22 MIL/uL (ref 3.87–5.11)
RDW: 11.7 % (ref 11.5–15.5)
WBC: 5.4 10*3/uL (ref 4.0–10.5)
nRBC: 0 % (ref 0.0–0.2)

## 2019-04-05 LAB — HCG, SERUM, QUALITATIVE: Preg, Serum: NEGATIVE

## 2019-04-05 MED ORDER — MECLIZINE HCL 12.5 MG PO TABS
25.0000 mg | ORAL_TABLET | Freq: Once | ORAL | Status: AC
  Administered 2019-04-05: 25 mg via ORAL
  Filled 2019-04-05: qty 2

## 2019-04-05 MED ORDER — FLUTICASONE PROPIONATE 50 MCG/ACT NA SUSP: 1.0000 | Freq: Every day | NASAL | 0 refills | Status: DC | PRN

## 2019-04-05 MED ORDER — POTASSIUM CHLORIDE CRYS ER 20 MEQ PO TBCR
40.0000 meq | EXTENDED_RELEASE_TABLET | Freq: Once | ORAL | Status: AC
  Administered 2019-04-05: 14:00:00 40 meq via ORAL
  Filled 2019-04-05: qty 2

## 2019-04-05 MED ORDER — MECLIZINE HCL 12.5 MG PO TABS: 12.5000 mg | ORAL_TABLET | Freq: Three times a day (TID) | ORAL | 0 refills | Status: DC | PRN

## 2019-04-05 NOTE — Discharge Instructions (Addendum)
You were seen in the ER today for dizziness.  Your labs were overall reassuring. Your potassium was somewhat low at 3.4, normal is 3.5-5.1, we have given you some potassium in the ED and have provided diet guidelines to help supplement this in your diet.   We suspect that you have vertigo.  We are sending you home with meclizine to take 1-2 tablets every 8 hours as needed for dizziness.   Vertigo can be caused by multiple things, sometimes allergies can contribute to this therefore we are sending you home with flonase to use 1 spray per nostril daily to help with congestion/ear pressure.   We have prescribed you new medication(s) today. Discuss the medications prescribed today with your pharmacist as they can have adverse effects and interactions with your other medicines including over the counter and prescribed medications. Seek medical evaluation if you start to experience new or abnormal symptoms after taking one of these medicines, seek care immediately if you start to experience difficulty breathing, feeling of your throat closing, facial swelling, or rash as these could be indications of a more serious allergic reaction  Please follow up with your primary care provider or one of the ear nose and throat specialist in your discharge instructions within 3 days. Return to the ED for new or worsening symptoms including but not limited to worsened dizziness, dizziness that does not go away/becomes constant, inability to keep things down, numbness, weakness, change in your vision, bad headache, or any other concerns.

## 2019-04-05 NOTE — ED Triage Notes (Signed)
Pt presents with complaints of new onset dizziness that started yesterday. Reports she rolled out of bed too fast and lost her balance when she tried to stand up. Reports the dizziness has gotten worse and she is feeling nauseous. Pt denies history of the same. Per [place] [facility] patient is being sent to the Emergency Department for further evaluation. Pt is alert and oriented.

## 2019-04-05 NOTE — ED Provider Notes (Signed)
[facility] EMERGENCY DEPARTMENT Provider Note   CSN: [id number] Arrival date & time: 04/05/19  1205     History Chief Complaint  Patient presents with  . Dizziness    Mariah Burnett is a 44 y.o. female with a history of mitral valve prolapse & asthma who presents to the ED with complaints of dizziness that began yesterday AM. Patient describes dizziness as if the room is spinning, states this occurs intermittently, triggered by quick head movements/positions (more so to the right), and alleviated by remaining still. Dizziness is associated with nausea. Episodes last approximately 30 seconds prior to resolution. She states she does have issues with allergies, some nasal congestion & ear fullness, also mentions that for several months now she gets an occasional ringing in one of her ears- brief, not pulsatile in nature, she states it more is that her hearing is really loud in that ear and muffled in the other, she is unsure which ear this occurs in, this is not new with her dizziness. She is not currently having any dizziness or tinnitus. Denies visual disturbance, numbness, weakness, speech changes, facial droop, vomiting, chest pain, dyspnea, syncope, ear pain, or ear drainage. LMP 2 weeks prior. Denies recent head injury.   HPI     Past Medical History:  Diagnosis Date  . Asthma   . MVP (mitral valve prolapse)    Although no evidence per echo in 2009. Trace MR  . Palpitation   . Scoliosis   . Urticaria     Patient Active Problem List   Diagnosis Date Noted  . Shortness of breath 09/25/2010  . Palpitation   . MVP (mitral valve prolapse)   . Scoliosis     Past Surgical History:  Procedure Laterality Date  . KIDNEY STONE SURGERY Bilateral 2000  . WISDOM TOOTH EXTRACTION Bilateral 1991     OB History    Gravida  0   Para  0   Term  0   Preterm  0   AB  0   Living  0     SAB  0   TAB  0   Ectopic  0   Multiple  0   Live Births  0           Family  History  Problem Relation Age of Onset  . Heart disease Mother   . Heart attack Mother   . Breast cancer Mother   . Urticaria Mother   . Cancer Father   . Cancer Paternal Grandfather   . Emphysema Paternal Grandfather     Social History   Tobacco Use  . Smoking status: Never Smoker  . Smokeless tobacco: Never Used  Substance Use Topics  . Alcohol use: Yes    Comment: occ  . Drug use: No    Home Medications Prior to Admission medications   Medication Sig Start Date End Date Taking? Authorizing Provider  albuterol (VENTOLIN HFA) 108 (90 Base) MCG/ACT inhaler Inhale 2 puffs into the lungs every 6 (six) hours as needed for wheezing or shortness of breath.    [provider]  Azelastine HCl 137 MCG/SPRAY SOLN PLACE 2 SPRAYS INTO BOTH NOSTRILS 2 (TWO) TIMES DAILY AS NEEDED FOR RHINITIS. 12/27/18   Alfonse Spruce, MD  cholecalciferol (VITAMIN D3) 25 MCG (1000 UT) tablet Take 1,000 Units by mouth daily.    [provider]  levocetirizine (XYZAL) 5 MG tablet TAKE 1 TABLET BY MOUTH EVERY DAY IN THE EVENING 01/05/19   Malachi Bonds  Louis, MD  metoprolol tartrate (LOPRESSOR) 25 MG tablet TAKE HALF A TABLET BY MOUTH TWICE A DAY 09/17/18   Laqueta Linden, MD  Mometasone Furoate [facility] HFA) 100 MCG/ACT AERO Inhale 1 puff into the lungs 2 (two) times a day. 06/18/18   Alfonse Spruce, MD  Multiple Vitamin (MULTIVITAMIN) tablet Take 1 tablet by mouth daily.    [provider]    Allergies    Latex  Review of Systems   Review of Systems  Constitutional: Negative for chills and fever.  HENT: Positive for congestion and tinnitus (intermittent for months). Negative for ear pain, sore throat, trouble swallowing and voice change.        Positive for ear pressure.   Respiratory: Negative for shortness of breath.   Cardiovascular: Negative for chest pain.  Gastrointestinal: Positive for nausea. Negative for abdominal pain and vomiting.   Genitourinary: Negative for vaginal bleeding and vaginal discharge.  Neurological: Positive for dizziness. Negative for seizures, syncope, facial asymmetry, speech difficulty, weakness and numbness.  All other systems reviewed and are negative.   Physical Exam Updated Vital Signs BP 132/84   Pulse 74   Temp 98.2 F (36.8 C) (Oral)   Resp 12   Ht 5\' 7"  (1.702 m)   Wt 65.8 kg   LMP 03/22/2019   SpO2 100%   BMI 22.71 kg/m   Physical Exam Vitals and nursing note reviewed.  Constitutional:      General: She is not in acute distress.    Appearance: She is well-developed. She is not toxic-appearing.  HENT:     Head: Normocephalic and atraumatic.     Right Ear: Ear canal normal. Tympanic membrane is not perforated, erythematous, retracted or bulging.     Left Ear: Ear canal normal. Tympanic membrane is not perforated, erythematous, retracted or bulging.     Ears:     Comments: Some fullness to bilateral TMs.  No mastoid erythema/swelling/tenderness.     Nose: Congestion present.     Right Sinus: No maxillary sinus tenderness or frontal sinus tenderness.     Left Sinus: No maxillary sinus tenderness or frontal sinus tenderness.     Mouth/Throat:     Pharynx: Uvula midline. No oropharyngeal exudate or posterior oropharyngeal erythema.     Comments: Posterior oropharynx is symmetric appearing. Patient tolerating own secretions without difficulty. No trismus. No drooling. No hot potato voice. No swelling beneath the tongue, submandibular compartment is soft.  Eyes:     General: Vision grossly intact. Gaze aligned appropriately. No visual field deficit.       Right eye: No discharge.        Left eye: No discharge.     Extraocular Movements: Extraocular movements intact.     Right eye: No nystagmus.     Left eye: No nystagmus.     Conjunctiva/sclera: Conjunctivae normal.     Pupils: Pupils are equal, round, and reactive to light.     Comments: PERRL. No proptosis. No rotational,  vertical, or bidirectional nystagmus.   Neck:     Vascular: No carotid bruit.  Cardiovascular:     Rate and Rhythm: Normal rate and regular rhythm.     Heart sounds: No murmur.  Pulmonary:     Effort: Pulmonary effort is normal. No respiratory distress.     Breath sounds: Normal breath sounds. No wheezing, rhonchi or rales.  Abdominal:     General: There is no distension.     Palpations: Abdomen is soft.  Tenderness: There is no abdominal tenderness.  Musculoskeletal:     Cervical back: Normal range of motion and neck supple. No edema or rigidity.  Lymphadenopathy:     Cervical: No cervical adenopathy.  Skin:    General: Skin is warm and dry.     Findings: No rash.  Neurological:     Mental Status: She is alert.     Comments: Alert. Clear speech. No facial droop. CNIII-XII grossly intact. Bilateral upper and lower extremities' sensation grossly intact. 5/5 symmetric strength with grip strength and with plantar and dorsi flexion bilaterally . Normal finger to nose bilaterally. Negative pronator drift. Negative Romberg sign. Gait is steady and intact.  Negative test of skew.   Psychiatric:        Behavior: Behavior normal.     ED Results / Procedures / Treatments   Labs (all labs ordered are listed, but only abnormal results are displayed) Labs Reviewed  BASIC METABOLIC PANEL - Abnormal; Notable for the following components:      Result Value   Potassium 3.4 (*)    All other components within normal limits  CBC  HCG, SERUM, QUALITATIVE    EKG EKG Interpretation  Date/Time:  Tuesday April 05 2019 12:17:27 EDT Ventricular Rate:  73 PR Interval:    QRS Duration: 91 QT Interval:  400 QTC Calculation: 441 R Axis:   62 Text Interpretation: Sinus rhythm Borderline T abnormalities, anterior leads since last tracing no significant change Confirmed by Mancel Bale [phone]) on 04/05/2019 12:27:07 PM   Radiology No results found.  Procedures Procedures (including  critical care time)  2:00PM Cardiac monitoring reveals sinus bradycardia, as reviewed and interpreted by me. Cardiac monitoring was ordered due to dizziness and to monitor patient for dysrhythmia.   Medications Ordered in ED Medications  meclizine (ANTIVERT) tablet 25 mg (25 mg Oral Given 04/05/19 1250)  potassium chloride SA (KLOR-CON) CR tablet 40 mEq (40 mEq Oral Given 04/05/19 1424)    ED Course  I have reviewed the triage vital signs and the nursing notes.  Pertinent labs & imaging results that were available during my care of the patient were reviewed by me and considered in my medical decision making (see chart for details).    MDM Rules/Calculators/A&P                      Patient presents to the ED with complaints of intermittent dizziness since yesterday AM. Nontoxic, resting comfortably, vitals WNL. Sxs are intermittent, triggered by certain positions/movements, and patient has a normal neurologic exam without deficits, negative test of skew, no notable pathological nystagmus- do not suspect central etiology such as CVA. H&P seems most consistent with peripheral vertigo. TMs appear a bit full, she does have some issues with allergies this may be contributory. Patient does mention some intermittent tinnitus for several months, not pulsatile in nature per patient report and no bruits on exam to raise concern for vascular tinnitus. Overall assessment reassuring EKG with no significant change compared to prior. Cardiac monitor reassuring. Labs with mild hypokalemia- oral supplement in the ED with diet guidelines. On re-assessment s/p meclizine patient is feeling much better, she is ambulatory throughout exam room and able to look around without dizziness at this time. Will discharge home with PRN meclizine & flonase with PCP/ENT follow up. I discussed results, treatment plan, need for follow-up, and return precautions with the patient. Provided opportunity for questions, patient confirmed  understanding and is in agreement with plan.  Final Clinical Impression(s) / ED Diagnoses Final diagnoses:  Dizziness  Hypokalemia    Rx / DC Orders ED Discharge Orders         Ordered    meclizine (ANTIVERT) 12.5 MG tablet  3 times daily PRN     04/05/19 1413    fluticasone (FLONASE) 50 MCG/ACT nasal spray  Daily PRN     04/05/19 [address] [place] R, PA-C 04/05/19 1426    Daleen Bo, MD 04/07/19 [phone]

## 2019-04-05 NOTE — ED Notes (Signed)
ED Provider at bedside. 

## 2019-04-05 NOTE — ED Triage Notes (Signed)
Pt with dizziness upon waking up on Monday morning, stand up quickly and felt off balanced and leaned into the wall.  Dizziness has continued since with sudden movements.

## 2019-04-06 ENCOUNTER — Ambulatory Visit (INDEPENDENT_AMBULATORY_CARE_PROVIDER_SITE_OTHER): Payer: BC Managed Care – PPO

## 2019-04-06 DIAGNOSIS — J309 Allergic rhinitis, unspecified: Secondary | ICD-10-CM | POA: Diagnosis not present

## 2019-04-14 ENCOUNTER — Ambulatory Visit: Payer: BC Managed Care – PPO | Attending: Internal Medicine

## 2019-04-14 ENCOUNTER — Other Ambulatory Visit (HOSPITAL_COMMUNITY): Payer: Self-pay | Admitting: Physician Assistant

## 2019-04-14 ENCOUNTER — Other Ambulatory Visit: Payer: Self-pay

## 2019-04-14 DIAGNOSIS — Z20822 Contact with and (suspected) exposure to covid-19: Secondary | ICD-10-CM | POA: Diagnosis not present

## 2019-04-14 DIAGNOSIS — Z1231 Encounter for screening mammogram for malignant neoplasm of breast: Secondary | ICD-10-CM

## 2019-04-15 ENCOUNTER — Ambulatory Visit (INDEPENDENT_AMBULATORY_CARE_PROVIDER_SITE_OTHER): Payer: BC Managed Care – PPO

## 2019-04-15 DIAGNOSIS — J309 Allergic rhinitis, unspecified: Secondary | ICD-10-CM

## 2019-04-15 LAB — NOVEL CORONAVIRUS, NAA: SARS-CoV-2, NAA: NOT DETECTED

## 2019-04-15 LAB — SARS-COV-2, NAA 2 DAY TAT

## 2019-04-18 ENCOUNTER — Other Ambulatory Visit: Payer: Self-pay

## 2019-04-18 ENCOUNTER — Ambulatory Visit: Payer: BC Managed Care – PPO | Attending: Internal Medicine

## 2019-04-18 ENCOUNTER — Other Ambulatory Visit: Payer: BC Managed Care – PPO

## 2019-04-18 DIAGNOSIS — Z20822 Contact with and (suspected) exposure to covid-19: Secondary | ICD-10-CM | POA: Diagnosis not present

## 2019-04-19 LAB — SARS-COV-2, NAA 2 DAY TAT

## 2019-04-19 LAB — NOVEL CORONAVIRUS, NAA: SARS-CoV-2, NAA: NOT DETECTED

## 2019-04-22 ENCOUNTER — Ambulatory Visit (INDEPENDENT_AMBULATORY_CARE_PROVIDER_SITE_OTHER): Payer: BC Managed Care – PPO

## 2019-04-22 DIAGNOSIS — J309 Allergic rhinitis, unspecified: Secondary | ICD-10-CM | POA: Diagnosis not present

## 2019-04-27 ENCOUNTER — Ambulatory Visit (INDEPENDENT_AMBULATORY_CARE_PROVIDER_SITE_OTHER): Payer: BC Managed Care – PPO

## 2019-04-27 DIAGNOSIS — J309 Allergic rhinitis, unspecified: Secondary | ICD-10-CM

## 2019-04-29 ENCOUNTER — Encounter (INDEPENDENT_AMBULATORY_CARE_PROVIDER_SITE_OTHER): Payer: Self-pay | Admitting: Otolaryngology

## 2019-04-29 ENCOUNTER — Ambulatory Visit (INDEPENDENT_AMBULATORY_CARE_PROVIDER_SITE_OTHER): Payer: BC Managed Care – PPO | Admitting: Otolaryngology

## 2019-04-29 ENCOUNTER — Other Ambulatory Visit: Payer: Self-pay

## 2019-04-29 VITALS — Temp 98.1°F

## 2019-04-29 DIAGNOSIS — R42 Dizziness and giddiness: Secondary | ICD-10-CM

## 2019-04-29 NOTE — Progress Notes (Signed)
HPI: Mariah Burnett is a 44 y.o. female who presents is referred by ED for evaluation of dizziness.  She initially developed dizziness while at work on Monday, 04/03/2019.  She felt a little disoriented and had a tendency to fall to her right.  She felt dizzy all day at work and got little hot and flush and eventually went home.  She initially tried the Epley maneuver recommended by some friends and she developed some spinning sensation which was worse when she turns her right.  She eventually went to the [facility], ED and had negative work-up except for low potassium. She was discharged with potassium supplement and meclizine 12.5 mg every 8 hours as needed dizziness.  She has not noted any significant hearing problems.  She feels like she hears little bit better in the left ear than the right but minimal difference.  She has had no drainage from the ears and no pain in the ears.. Patient has a bad allergies and uses nasal steroid spray as well as antihistamines.  She also sees an allergist and gets allergy shots.  Past Medical History:  Diagnosis Date  . Asthma   . MVP (mitral valve prolapse)    Although no evidence per echo in 2009. Trace MR  . Palpitation   . Scoliosis   . Urticaria    Past Surgical History:  Procedure Laterality Date  . KIDNEY STONE SURGERY Bilateral 2000  . WISDOM TOOTH EXTRACTION Bilateral 1991   Social History   Socioeconomic History  . Marital status: Married    Spouse name: Not on file  . Number of children: Not on file  . Years of education: Not on file  . Highest education level: Not on file  Occupational History  . Not on file  Tobacco Use  . Smoking status: Never Smoker  . Smokeless tobacco: Never Used  Substance and Sexual Activity  . Alcohol use: Yes    Comment: occ  . Drug use: No  . Sexual activity: Yes    Birth control/protection: None  Other Topics Concern  . Not on file  Social History Narrative  . Not on file   Social Determinants of  Health   Financial Resource Strain:   . Difficulty of Paying Living Expenses:   Food Insecurity:   . Worried About Programme researcher, broadcasting/film/video in the Last Year:   . Barista in the Last Year:   Transportation Needs:   . Freight forwarder (Medical):   Marland Kitchen Lack of Transportation (Non-Medical):   Physical Activity:   . Days of Exercise per Week:   . Minutes of Exercise per Session:   Stress:   . Feeling of Stress :   Social Connections:   . Frequency of Communication with Friends and Family:   . Frequency of Social Gatherings with Friends and Family:   . Attends Religious Services:   . Active Member of Clubs or Organizations:   . Attends Banker Meetings:   Marland Kitchen Marital Status:    Family History  Problem Relation Age of Onset  . Heart disease Mother   . Heart attack Mother   . Breast cancer Mother   . Urticaria Mother   . Cancer Father   . Cancer Paternal Grandfather   . Emphysema Paternal Grandfather    Allergies  Allergen Reactions  . Latex Rash   Prior to Admission medications   Medication Sig Start Date End Date Taking? Authorizing Provider  albuterol (VENTOLIN HFA) 108 (  90 Base) MCG/ACT inhaler Inhale 2 puffs into the lungs every 6 (six) hours as needed for wheezing or shortness of breath.   Yes [provider]  Azelastine HCl 137 MCG/SPRAY SOLN PLACE 2 SPRAYS INTO BOTH NOSTRILS 2 (TWO) TIMES DAILY AS NEEDED FOR RHINITIS. 12/27/18  Yes Valentina Shaggy, MD  cholecalciferol (VITAMIN D3) 25 MCG (1000 UT) tablet Take 1,000 Units by mouth daily.   Yes [provider]  fluticasone (FLONASE) 50 MCG/ACT nasal spray Place 1 spray into both nostrils daily as needed for allergies or rhinitis. 04/05/19  Yes Petrucelli, Samantha R, PA-C  levocetirizine (XYZAL) 5 MG tablet TAKE 1 TABLET BY MOUTH EVERY DAY IN THE EVENING 01/05/19  Yes Valentina Shaggy, MD  meclizine (ANTIVERT) 12.5 MG tablet Take 1-2 tablets (12.5-25 mg total) by mouth 3 (three)  times daily as needed for dizziness. 04/05/19  Yes Petrucelli, Samantha R, PA-C  metoprolol tartrate (LOPRESSOR) 25 MG tablet TAKE HALF A TABLET BY MOUTH TWICE A DAY 09/17/18  Yes Herminio Commons, MD  Mometasone Furoate (ASMANEX HFA) 100 MCG/ACT AERO Inhale 1 puff into the lungs 2 (two) times a day. 06/18/18  Yes Valentina Shaggy, MD  Multiple Vitamin (MULTIVITAMIN) tablet Take 1 tablet by mouth daily.   Yes [provider]     Positive ROS: Otherwise negative  All other systems have been reviewed and were otherwise negative with the exception of those mentioned in the HPI and as above.  Physical Exam: Constitutional: Alert, well-appearing, no acute distress Ears: External ears without lesions or tenderness. Ear canals are clear bilaterally with intact, clear TMs bilaterally.  On Dix-Hallpike testing there was no clinical evidence in the office today of BPPV.  On hearing screening with a 512 1024 tuning fork she heard about the same in both ears with minimal hearing loss. Nasal: External nose without lesions. Septum midline.  She has moderate rhinitis.. Clear nasal passages Oral: Lips and gums without lesions. Tongue and palate mucosa without lesions. Posterior oropharynx clear. Neck: No palpable adenopathy or masses Respiratory: Breathing comfortably  Skin: No facial/neck lesions or rash noted.  Procedures  Assessment: Dizziness questionable etiology. Possible BPPV although this was not demonstrated in the office today.  Plan: Gave her information on the Epley maneuver and BPPV. Suggested use of meclizine 12.5mg  as needed dizziness. If dizziness worsens or persists she will call us back to schedule VNG testing and audiologic testing.   Radene Journey, MD   CC:

## 2019-05-04 ENCOUNTER — Ambulatory Visit (INDEPENDENT_AMBULATORY_CARE_PROVIDER_SITE_OTHER): Payer: BC Managed Care – PPO

## 2019-05-04 DIAGNOSIS — J309 Allergic rhinitis, unspecified: Secondary | ICD-10-CM

## 2019-05-11 DIAGNOSIS — L821 Other seborrheic keratosis: Secondary | ICD-10-CM | POA: Diagnosis not present

## 2019-05-11 DIAGNOSIS — Z85828 Personal history of other malignant neoplasm of skin: Secondary | ICD-10-CM | POA: Diagnosis not present

## 2019-05-11 DIAGNOSIS — L570 Actinic keratosis: Secondary | ICD-10-CM | POA: Diagnosis not present

## 2019-05-11 DIAGNOSIS — L738 Other specified follicular disorders: Secondary | ICD-10-CM | POA: Diagnosis not present

## 2019-05-18 ENCOUNTER — Ambulatory Visit (INDEPENDENT_AMBULATORY_CARE_PROVIDER_SITE_OTHER): Payer: BC Managed Care – PPO

## 2019-05-18 DIAGNOSIS — J309 Allergic rhinitis, unspecified: Secondary | ICD-10-CM

## 2019-05-23 ENCOUNTER — Ambulatory Visit (HOSPITAL_COMMUNITY)
Admission: RE | Admit: 2019-05-23 | Discharge: 2019-05-23 | Disposition: A | Discharge status: Home / Self Care | Payer: BC Managed Care – PPO | Source: Ambulatory Visit | Attending: Physician Assistant | Admitting: Physician Assistant

## 2019-05-23 ENCOUNTER — Other Ambulatory Visit: Payer: Self-pay

## 2019-05-23 DIAGNOSIS — Z1231 Encounter for screening mammogram for malignant neoplasm of breast: Secondary | ICD-10-CM | POA: Diagnosis not present

## 2019-05-25 ENCOUNTER — Ambulatory Visit (INDEPENDENT_AMBULATORY_CARE_PROVIDER_SITE_OTHER): Payer: BC Managed Care – PPO

## 2019-05-25 DIAGNOSIS — J309 Allergic rhinitis, unspecified: Secondary | ICD-10-CM

## 2019-06-01 ENCOUNTER — Ambulatory Visit (INDEPENDENT_AMBULATORY_CARE_PROVIDER_SITE_OTHER): Payer: BC Managed Care – PPO

## 2019-06-01 DIAGNOSIS — J309 Allergic rhinitis, unspecified: Secondary | ICD-10-CM | POA: Diagnosis not present

## 2019-06-02 ENCOUNTER — Telehealth: Payer: Self-pay | Admitting: Adult Health

## 2019-06-02 ENCOUNTER — Other Ambulatory Visit: Payer: Self-pay | Admitting: *Deleted

## 2019-06-02 DIAGNOSIS — N6310 Unspecified lump in the right breast, unspecified quadrant: Secondary | ICD-10-CM

## 2019-06-02 DIAGNOSIS — N6320 Unspecified lump in the left breast, unspecified quadrant: Secondary | ICD-10-CM

## 2019-06-02 NOTE — Telephone Encounter (Signed)
Pt needs diagnostic mammogram scheduled. But needs orders before they are able to schedule can jennifer put an order in for this? Contact info is for the scheduler at [facility]

## 2019-06-07 ENCOUNTER — Ambulatory Visit: Payer: BC Managed Care – PPO | Admitting: Family Medicine

## 2019-06-07 ENCOUNTER — Encounter: Payer: Self-pay | Admitting: Family Medicine

## 2019-06-07 NOTE — Progress Notes (Deleted)
Cardiology Office Note  Date: 06/07/2019   ID: Mariah Burnett, DOB September 07, 1975, MRN [id number]  PCP:  Elfredia Nevins, MD  Cardiologist:  Prentice Docker, MD Electrophysiologist:  None   Chief Complaint: Follow-up chest pressure  History of Present Illness: Mariah Burnett is a 44 y.o. female with a history of chest pain, palpitations, mitral valve prolapse.  Last encounter with Dr. Purvis Sheffield 09/23/2018 via telehealth.  She complained of stress at work for the previous 2 weeks which did put an additional burden on her.  She described the chest discomfort as feeling like a wave and lasting for about 20 minutes.  She described it as pressure.  There was no radiation to arm, back, or jaw.  She also describes some flank pain which could alternate sides.  She denied any dysuria.  She denies any lower extremity edema, dizziness, lightheadedness.  She was in the process of planning a wedding with her fianc.  This had caused a lot of stress as well.  Dr. Purvis Sheffield did not feel her symptoms warranted a stress test.  She was symptomatically stable with her palpitations on metoprolol 12.5 mg a day.  Her asthma symptoms were exacerbated by wearing a facemask.  Dr. Purvis Sheffield provided her with a note to be able to wear a face shield versus a facemask at work.  Patient wore nontelemetry monitor  July 15, 2017.  Per Dr. Purvis Sheffield there were no worrisome rhythms seen.  She had an irregular rhythm primarily seen with an isolated episode of a fast irregular heart rate.  Had a recent visit to [facility], ER on 04/05/2019 with complaints of dizziness.  She described it as if the room is spinning.  Occurring intermittently, triggered by quick head movements, positions and alleviated by remaining still.  dizziness was associated with nausea.  Episodes lasted approximately 30 seconds prior to resolution.  She was given meclizine 25 mg, and potassium chloride 40 mEq x 1.  Past Medical History:  Diagnosis Date  .  Asthma   . MVP (mitral valve prolapse)    Although no evidence per echo in 2009. Trace MR  . Palpitation   . Scoliosis   . Urticaria     Past Surgical History:  Procedure Laterality Date  . KIDNEY STONE SURGERY Bilateral 2000  . WISDOM TOOTH EXTRACTION Bilateral 1991    Current Outpatient Medications  Medication Sig Dispense Refill  . albuterol (VENTOLIN HFA) 108 (90 Base) MCG/ACT inhaler Inhale 2 puffs into the lungs every 6 (six) hours as needed for wheezing or shortness of breath.    . Azelastine HCl 137 MCG/SPRAY SOLN PLACE 2 SPRAYS INTO BOTH NOSTRILS 2 (TWO) TIMES DAILY AS NEEDED FOR RHINITIS. 90 mL 1  . cholecalciferol (VITAMIN D3) 25 MCG (1000 UT) tablet Take 1,000 Units by mouth daily.    . fluticasone (FLONASE) 50 MCG/ACT nasal spray Place 1 spray into both nostrils daily as needed for allergies or rhinitis. 16 g 0  . levocetirizine (XYZAL) 5 MG tablet TAKE 1 TABLET BY MOUTH EVERY DAY IN THE EVENING 90 tablet 0  . meclizine (ANTIVERT) 12.5 MG tablet Take 1-2 tablets (12.5-25 mg total) by mouth 3 (three) times daily as needed for dizziness. 30 tablet 0  . metoprolol tartrate (LOPRESSOR) 25 MG tablet TAKE HALF A TABLET BY MOUTH TWICE A DAY 180 tablet 3  . Mometasone Furoate (ASMANEX HFA) 100 MCG/ACT AERO Inhale 1 puff into the lungs 2 (two) times a day. 13 g 2  . Multiple Vitamin (MULTIVITAMIN)  tablet Take 1 tablet by mouth daily.     No current facility-administered medications for this visit.   Allergies:  Latex   Social History: The patient  reports that she has never smoked. She has never used smokeless tobacco. She reports current alcohol use. She reports that she does not use drugs.   Family History: The patient's family history includes Breast cancer in her mother; Cancer in her father and paternal grandfather; Emphysema in her paternal grandfather; Heart attack in her mother; Heart disease in her mother; Urticaria in her mother.   ROS:  Please see the history of  present illness. Otherwise, complete review of systems is positive for {NONE DEFAULTED:18576::"none"}.  All other systems are reviewed and negative.   Physical Exam: VS:  LMP 05/23/2019 , BMI There is no height or weight on file to calculate BMI.  Wt Readings from Last 3 Encounters:  04/05/19 145 lb (65.8 kg)  09/23/18 135 lb (61.2 kg)  09/17/18 140 lb 3.2 oz (63.6 kg)    General: Patient appears comfortable at rest. HEENT: Conjunctiva and lids normal, oropharynx clear with moist mucosa. Neck: Supple, no elevated JVP or carotid bruits, no thyromegaly. Lungs: Clear to auscultation, nonlabored breathing at rest. Cardiac: Regular rate and rhythm, no S3 or significant systolic murmur, no pericardial rub. Abdomen: Soft, nontender, no hepatomegaly, bowel sounds present, no guarding or rebound. Extremities: No pitting edema, distal pulses 2+. Skin: Warm and dry. Musculoskeletal: No kyphosis. Neuropsychiatric: Alert and oriented x3, affect grossly appropriate.  ECG:  {EKG/Telemetry Strips Reviewed:[phone]}  Recent Labwork: 04/05/2019: BUN 13; Creatinine, Ser 0.63; Hemoglobin 13.6; Platelets 202; Potassium 3.4; Sodium 136     Component Value Date/Time   CHOL 179 04/27/2018 1512   TRIG 95 04/27/2018 1512   HDL 61 04/27/2018 1512   CHOLHDL 2.9 04/27/2018 1512   CHOLHDL 3 09/25/2010 0837   VLDL 25.8 09/25/2010 0837   LDLCALC 99 04/27/2018 1512    Other Studies Reviewed Today:   Assessment and Plan:  1. Chest pressure   2. Heart palpitations      Medication Adjustments/Labs and Tests Ordered: Current medicines are reviewed at length with the patient today.  Concerns regarding medicines are outlined above.   Disposition: Follow-up with ***  Signed, Levell July, NP 06/07/2019 7:47 AM    [place] [facility] [address] [place], [place], [place] [address] Phone: [phone]; Fax: [phone]

## 2019-06-08 ENCOUNTER — Ambulatory Visit (INDEPENDENT_AMBULATORY_CARE_PROVIDER_SITE_OTHER): Payer: BC Managed Care – PPO

## 2019-06-08 ENCOUNTER — Telehealth: Payer: Self-pay

## 2019-06-08 ENCOUNTER — Other Ambulatory Visit: Payer: Self-pay | Admitting: Allergy & Immunology

## 2019-06-08 ENCOUNTER — Telehealth: Payer: Self-pay | Admitting: Adult Health

## 2019-06-08 DIAGNOSIS — R928 Other abnormal and inconclusive findings on diagnostic imaging of breast: Secondary | ICD-10-CM

## 2019-06-08 DIAGNOSIS — J309 Allergic rhinitis, unspecified: Secondary | ICD-10-CM

## 2019-06-08 MED ORDER — LEVOCETIRIZINE DIHYDROCHLORIDE 5 MG PO TABS: ORAL_TABLET | ORAL | 0 refills | Status: DC

## 2019-06-08 NOTE — Telephone Encounter (Signed)
Patient wants to know why her mammogram was scheduled in [place] instead of at [facility]. Patient prefers to do mammogram in [place] at [facility].

## 2019-06-08 NOTE — Telephone Encounter (Signed)
Pt needs refill on Xyzal

## 2019-06-08 NOTE — Addendum Note (Signed)
Addended by: Cyril Mourning A on: 06/08/2019 01:22 PM   Modules accepted: Orders

## 2019-06-08 NOTE — Telephone Encounter (Signed)
Will place orders for [facility] to get mammogram

## 2019-06-22 ENCOUNTER — Ambulatory Visit (INDEPENDENT_AMBULATORY_CARE_PROVIDER_SITE_OTHER): Payer: BC Managed Care – PPO

## 2019-06-22 DIAGNOSIS — J309 Allergic rhinitis, unspecified: Secondary | ICD-10-CM | POA: Diagnosis not present

## 2019-06-23 ENCOUNTER — Other Ambulatory Visit: Payer: BC Managed Care – PPO

## 2019-06-28 ENCOUNTER — Ambulatory Visit (HOSPITAL_COMMUNITY)
Admission: RE | Admit: 2019-06-28 | Discharge: 2019-06-28 | Disposition: A | Discharge status: Home / Self Care | Payer: BC Managed Care – PPO | Source: Ambulatory Visit | Attending: Adult Health | Admitting: Adult Health

## 2019-06-28 ENCOUNTER — Other Ambulatory Visit: Payer: Self-pay

## 2019-06-28 DIAGNOSIS — R928 Other abnormal and inconclusive findings on diagnostic imaging of breast: Secondary | ICD-10-CM | POA: Insufficient documentation

## 2019-06-28 DIAGNOSIS — N6011 Diffuse cystic mastopathy of right breast: Secondary | ICD-10-CM | POA: Diagnosis not present

## 2019-06-28 DIAGNOSIS — N6012 Diffuse cystic mastopathy of left breast: Secondary | ICD-10-CM | POA: Diagnosis not present

## 2019-06-28 DIAGNOSIS — R922 Inconclusive mammogram: Secondary | ICD-10-CM | POA: Diagnosis not present

## 2019-06-29 ENCOUNTER — Telehealth: Payer: Self-pay | Admitting: Adult Health

## 2019-06-29 ENCOUNTER — Ambulatory Visit (INDEPENDENT_AMBULATORY_CARE_PROVIDER_SITE_OTHER): Payer: BC Managed Care – PPO

## 2019-06-29 DIAGNOSIS — J309 Allergic rhinitis, unspecified: Secondary | ICD-10-CM

## 2019-06-29 NOTE — Telephone Encounter (Signed)
Mariah Burnett and she is aware that mammogram/US showed bilateral breast cysts and she has family history of breast cancer with 26.4% risk of breast cancer ACS recommends yearly screening MRI, she is going to check with her insurance and let me know if she wants to get

## 2019-07-06 ENCOUNTER — Ambulatory Visit (INDEPENDENT_AMBULATORY_CARE_PROVIDER_SITE_OTHER): Payer: BC Managed Care – PPO

## 2019-07-06 DIAGNOSIS — J309 Allergic rhinitis, unspecified: Secondary | ICD-10-CM

## 2019-07-08 NOTE — Progress Notes (Signed)
VIALS EXP 07-07-20 

## 2019-07-12 ENCOUNTER — Encounter: Payer: Self-pay | Admitting: Student

## 2019-07-12 ENCOUNTER — Ambulatory Visit: Payer: BC Managed Care – PPO | Admitting: Student

## 2019-07-12 ENCOUNTER — Other Ambulatory Visit: Payer: Self-pay

## 2019-07-12 VITALS — BP 98/68 | HR 72 | Ht 67.0 in | Wt 151.8 lb

## 2019-07-12 DIAGNOSIS — R0789 Other chest pain: Secondary | ICD-10-CM

## 2019-07-12 DIAGNOSIS — R002 Palpitations: Secondary | ICD-10-CM | POA: Diagnosis not present

## 2019-07-12 DIAGNOSIS — Z8679 Personal history of other diseases of the circulatory system: Secondary | ICD-10-CM | POA: Diagnosis not present

## 2019-07-12 DIAGNOSIS — J3089 Other allergic rhinitis: Secondary | ICD-10-CM | POA: Diagnosis not present

## 2019-07-12 NOTE — Progress Notes (Signed)
Cardiology Office Note    Date:  07/12/2019   ID:  Mariah Burnett, DOB [birth year], MRN [id number]  PCP:  Elfredia Nevins, MD  Cardiologist: Prentice Docker, MD    Chief Complaint  Patient presents with  . Follow-up    6 month visit    History of Present Illness:    Mariah Burnett is a 44 y.o. female with past medical history of palpitations, asthma and prior history of MVP (only trace MR by follow-up imaging in 2009 and 2012) who presents to the office today for 54-month follow-up.  She most recently had a phone visit with Dr. Purvis Sheffield in 09/2018 and reported having recent episodes of chest discomfort which had been relieved with taking deep breaths. She reported being under increased stress as she was planning an upcoming wedding. By review of notes, it was felt that her episodes of chest discomfort and palpitations were likely secondary to stress and anxiety, therefore she was continued on Lopressor 12.5 mg twice daily and continued monitoring of symptoms was advised.  In talking with the patient today, she reports intermittent episodes of palpitations which can occur several times per week and last for minutes at a time. She takes Lopressor 12.5mg  BID and sometimes uses an extra half tablet as needed for palpitations. She has reduced her caffeine intake to 2 cups of coffee daily. She does report intermittent dizziness with progressive symptoms in 03/2019 which led to an Emergency Department evaluation. She reports symptoms have improved since and she was informed her dizziness was likely due to inner ear. Was prescribed Meclizine at that time.   She has resumed exercising and denies any anginal symptoms with this. Does report occasional episodes of chest discomfort which are sometimes triggered by the warmer weather. Reports having active discomfort at the time of her visit today. Not necessarily associated with exertion or positional changes. No recent orthopnea, PND or lower extremity  edema.    Past Medical History:  Diagnosis Date  . Asthma   . MVP (mitral valve prolapse)    Although no evidence per echo in 2009. Trace MR  . Palpitation   . Scoliosis   . Urticaria     Past Surgical History:  Procedure Laterality Date  . KIDNEY STONE SURGERY Bilateral 2000  . WISDOM TOOTH EXTRACTION Bilateral 1991    Current Medications: Outpatient Medications Prior to Visit  Medication Sig Dispense Refill  . albuterol (VENTOLIN HFA) 108 (90 Base) MCG/ACT inhaler Inhale 2 puffs into the lungs every 6 (six) hours as needed for wheezing or shortness of breath.    . Azelastine HCl 137 MCG/SPRAY SOLN PLACE 2 SPRAYS INTO BOTH NOSTRILS 2 (TWO) TIMES DAILY AS NEEDED FOR RHINITIS. 90 mL 1  . cholecalciferol (VITAMIN D3) 25 MCG (1000 UT) tablet Take 1,000 Units by mouth daily.    Marland Kitchen levocetirizine (XYZAL) 5 MG tablet TAKE 1 TABLET BY MOUTH EVERY DAY IN THE EVENING 90 tablet 0  . meclizine (ANTIVERT) 12.5 MG tablet Take 1-2 tablets (12.5-25 mg total) by mouth 3 (three) times daily as needed for dizziness. 30 tablet 0  . metoprolol tartrate (LOPRESSOR) 25 MG tablet TAKE HALF A TABLET BY MOUTH TWICE A DAY 180 tablet 3  . Mometasone Furoate (ASMANEX HFA) 100 MCG/ACT AERO Inhale 1 puff into the lungs 2 (two) times a day. 13 g 2  . Multiple Vitamin (MULTIVITAMIN) tablet Take 1 tablet by mouth daily.    Marland Kitchen tretinoin (RETIN-A) 0.025 % cream Apply 1 application topically at bedtime.    Marland Kitchen  fluticasone (FLONASE) 50 MCG/ACT nasal spray Place 1 spray into both nostrils daily as needed for allergies or rhinitis. (Patient not taking: Reported on 07/12/2019) 16 g 0   No facility-administered medications prior to visit.     Allergies:   Latex   Social History   Socioeconomic History  . Marital status: Married    Spouse name: Not on file  . Number of children: Not on file  . Years of education: Not on file  . Highest education level: Not on file  Occupational History  . Not on file  Tobacco Use    . Smoking status: Never Smoker  . Smokeless tobacco: Never Used  Vaping Use  . Vaping Use: Never used  Substance and Sexual Activity  . Alcohol use: Yes    Comment: occ  . Drug use: No  . Sexual activity: Yes    Birth control/protection: None  Other Topics Concern  . Not on file  Social History Narrative  . Not on file   Social Determinants of Health   Financial Resource Strain:   . Difficulty of Paying Living Expenses:   Food Insecurity:   . Worried About Programme researcher, broadcasting/film/video in the Last Year:   . Barista in the Last Year:   Transportation Needs:   . Freight forwarder (Medical):   Marland Kitchen Lack of Transportation (Non-Medical):   Physical Activity:   . Days of Exercise per Week:   . Minutes of Exercise per Session:   Stress:   . Feeling of Stress :   Social Connections:   . Frequency of Communication with Friends and Family:   . Frequency of Social Gatherings with Friends and Family:   . Attends Religious Services:   . Active Member of Clubs or Organizations:   . Attends Banker Meetings:   Marland Kitchen Marital Status:      Family History:  The patient's family history includes Breast cancer in her mother; Cancer in her father and paternal grandfather; Emphysema in her paternal grandfather; Heart attack in her mother; Heart disease in her mother; Urticaria in her mother.   Review of Systems:   Please see the history of present illness.     General:  No chills, fever, night sweats or weight changes.  Cardiovascular:  No dyspnea on exertion, edema, orthopnea, paroxysmal nocturnal dyspnea. Positive for chest pain and palpitations.  Dermatological: No rash, lesions/masses Respiratory: No cough, dyspnea Urologic: No hematuria, dysuria Abdominal:   No nausea, vomiting, diarrhea, bright red blood per rectum, melena, or hematemesis Neurologic:  No visual changes, wkns, changes in mental status. All other systems reviewed and are otherwise negative except as noted  above.   Physical Exam:    VS:  BP 98/68   Pulse 72   Ht 5\' 7"  (1.702 m)   Wt 151 lb 12.8 oz (68.9 kg)   SpO2 98%   BMI 23.78 kg/m    General: Well developed, well nourished,female appearing in no acute distress. Head: Normocephalic, atraumatic, sclera non-icteric.  Neck: No carotid bruits. JVD not elevated.  Lungs: Respirations regular and unlabored, without wheezes or rales.  Heart: Regular rate and rhythm. No S3 or S4.  No murmur, no rubs, or gallops appreciated. Abdomen: Soft, non-tender, non-distended. No obvious abdominal masses. Msk:  Strength and tone appear normal for age. No obvious joint deformities or effusions. Extremities: No clubbing or cyanosis. No lower extremity edema.  Distal pedal pulses are 2+ bilaterally. Neuro: Alert and oriented X 3.  Moves all extremities spontaneously. No focal deficits noted. Psych:  Responds to questions appropriately with a normal affect. Skin: No rashes or lesions noted  Wt Readings from Last 3 Encounters:  07/12/19 151 lb 12.8 oz (68.9 kg)  04/05/19 145 lb (65.8 kg)  09/23/18 135 lb (61.2 kg)     Studies/Labs Reviewed:   EKG:  EKG is ordered today.  The ekg ordered today demonstrates NSR, HR 63 with no acute ST abnormalities.   Recent Labs: 04/05/2019: BUN 13; Creatinine, Ser 0.63; Hemoglobin 13.6; Platelets 202; Potassium 3.4; Sodium 136   Lipid Panel    Component Value Date/Time   CHOL 179 04/27/2018 1512   TRIG 95 04/27/2018 1512   HDL 61 04/27/2018 1512   CHOLHDL 2.9 04/27/2018 1512   CHOLHDL 3 09/25/2010 0837   VLDL 25.8 09/25/2010 0837   LDLCALC 99 04/27/2018 1512    Additional studies/ records that were reviewed today include:   ETT: 08/2015  Blood pressure demonstrated a normal response to exercise.  There was no ST segment deviation noted during stress.  Negative exerise stress test for ischemia. Duke treadmill score of 9 consistent with low risk for major cardiac events.  Excellent exercise capacity  (120% of predicted based on age and gender)  Event Monitor: 07/2017  Sinus rhythm and sinus tachycardia seen. Maximum heart rate 123 bpm. No significant arrhythmias.  Assessment:    1. Heart palpitations   2. Chest pressure   3. History of mitral valve prolapse      Plan:   In order of problems listed above:  1. Palpitations - She does report frequent palpitations which are typically most noticeable after exercising but can occur at any time. She has reduced her caffeine intake and has not noticed an association with alcohol consumption. She is currently on Lopressor 12.5mg  BID and did not tolerate Toprol-XL in the past as this did not control her symptoms. We reviewed options in regards to continuing her current medication regimen or switching to short-acting Cardizem. Will continue Lopressor for now and she is aware to take an extra tablet as needed. Cannot titrate her baseline dosing as BP is soft at 98/68. If switching to short-acting Cardizem in the future, would use 30mg  BID.  - She did have hypokalemia earlier this year. Will recheck a BMET along with TSH. Hgb was stable at 13.6 in 03/2019.  2. Chest Pain - She has a history of recurrent chest pain and reports discomfort along her chest at different times, sometimes worse in the warmer temperatures. She has been participating in exercise classes and denies any symptoms at that time. She did have a normal ETT in 2017. EKG today is without acute changes. Given no exertional symptoms, would not pursue further testing at this time. I encouraged her to reach out and make us aware if she develops progressive symptoms or symptoms with exertion as repeat treadmill testing or a Coronary CT could be considered at that time.   3. History of Mitral Valve Prolapse - Most recent imaging in 2009 and 2012 only showed trace MR. A murmur was not appreciated on examination today. Continue with routine monitoring. We discussed obtaining a repeat  echocardiogram in the next 2-3 years unless she develops symptoms in the interim which would prompt closer examination.     Medication Adjustments/Labs and Tests Ordered: Current medicines are reviewed at length with the patient today.  Concerns regarding medicines are outlined above.  Medication changes, Labs and Tests ordered today are listed  in the Patient Instructions below. Patient Instructions  Medication Instructions:  Your physician recommends that you continue on your current medications as directed. Please refer to the Current Medication list given to you today.  *If you need a refill on your cardiac medications before your next appointment, please call your pharmacy*   Lab Work: Your physician recommends that you return for lab work in: [place], [facility]   If you have labs (blood work) drawn today and your tests are completely normal, you will receive your results only by: Marland Kitchen MyChart Message (if you have MyChart) OR . A paper copy in the mail If you have any lab test that is abnormal or we need to change your treatment, we will call you to review the results.   Testing/Procedures: NONE    Follow-Up: At [facility], you and your health needs are our priority.  As part of our continuing mission to provide you with exceptional heart care, we have created designated Provider Care Teams.  These Care Teams include your primary Cardiologist (physician) and Advanced Practice Providers (APPs -  Physician Assistants and Nurse Practitioners) who all work together to provide you with the care you need, when you need it.  We recommend signing up for the patient portal called "MyChart".  Sign up information is provided on this After Visit Summary.  MyChart is used to connect with patients for Virtual Visits (Telemedicine).  Patients are able to view lab/test results, encounter notes, upcoming appointments, etc.  Non-urgent messages can be sent to your provider as well.   To learn more about what  you can do with MyChart, go to [email or web address].    Your next appointment:   12 month(s)  The format for your next appointment:   In Person  Provider:   Dina Rich, MD      Signed, Ellsworth Lennox, PA-C  07/12/2019 8:30 PM    [facility] [address] [place], [place] [address] Phone: [phone] Fax: [phone]

## 2019-07-12 NOTE — Progress Notes (Deleted)
Duplicate

## 2019-07-12 NOTE — Patient Instructions (Addendum)
Medication Instructions:  Your physician recommends that you continue on your current medications as directed. Please refer to the Current Medication list given to you today.  *If you need a refill on your cardiac medications before your next appointment, please call your pharmacy*   Lab Work: Your physician recommends that you return for lab work in: [place], [facility]   If you have labs (blood work) drawn today and your tests are completely normal, you will receive your results only by: Marland Kitchen MyChart Message (if you have MyChart) OR . A paper copy in the mail If you have any lab test that is abnormal or we need to change your treatment, we will call you to review the results.   Testing/Procedures: NONE    Follow-Up: At [facility], you and your health needs are our priority.  As part of our continuing mission to provide you with exceptional heart care, we have created designated Provider Care Teams.  These Care Teams include your primary Cardiologist (physician) and Advanced Practice Providers (APPs -  Physician Assistants and Nurse Practitioners) who all work together to provide you with the care you need, when you need it.  We recommend signing up for the patient portal called "MyChart".  Sign up information is provided on this After Visit Summary.  MyChart is used to connect with patients for Virtual Visits (Telemedicine).  Patients are able to view lab/test results, encounter notes, upcoming appointments, etc.  Non-urgent messages can be sent to your provider as well.   To learn more about what you can do with MyChart, go to [email or web address].    Your next appointment:   12 month(s)  The format for your next appointment:   In Person  Provider:   Dina Rich, MD   Other Instructions Potassium Content of Foods  Potassium is a mineral found in many foods and drinks. It affects how the heart works, and helps keep fluids and minerals balanced in the body. The amount of  potassium you need each day depends on your age and any medical conditions you may have. Talk to your health care provider or dietitian about how much potassium you need. The following lists of foods provide the general serving size for foods and the approximate amount of potassium in each serving, listed in milligrams (mg). Actual values may vary depending on the product and how it is processed. High in potassium The following foods and beverages have 200 mg or more of potassium per serving:  Apricots (raw) - 2 have 200 mg of potassium.  Apricots (dry) - 5 have 200 mg of potassium.  Artichoke - 1 medium has 345 mg of potassium.  Avocado -  fruit has 245 mg of potassium.  Banana - 1 medium fruit has 425 mg of potassium.  Lima or baked beans (canned) -  cup has 280 mg of potassium.  White beans (canned) -  cup has 595 mg potassium.  Beef roast - 3 oz has 320 mg of potassium.  Ground beef - 3 oz has 270 mg of potassium.  Beets (raw or cooked) -  cup has 260 mg of potassium.  Bran muffin - 2 oz has 300 mg of potassium.  Broccoli (cooked) -  cup has 230 mg of potassium.  Brussels sprouts -  cup has 250 mg of potassium.  Cantaloupe -  cup has 215 mg of potassium.  Cereal, 100% bran -  cup has 200-400 mg of potassium.  Cheeseburger -1 single fast food burger has 225-400 mg  of potassium.  Chicken - 3 oz has 220 mg of potassium.  Clams (canned) - 3 oz has 535 mg of potassium.  Crab - 3 oz has 225 mg of potassium.  Dates - 5 have 270 mg of potassium.  Dried beans and peas -  cup has 300-475 mg of potassium.  Figs (dried) - 2 have 260 mg of potassium.  Fish (halibut, tuna, cod, snapper) - 3 oz has 480 mg of potassium.  Fish (salmon, haddock, swordfish, perch) - 3 oz has 300 mg of potassium.  Fish (tuna, canned) - 3 oz has 200 mg of potassium.  [place] fries (fast food) - 3 oz has 470 mg of potassium.  Granola with fruit and nuts -  cup has 200 mg of  potassium.  Grapefruit juice -  cup has 200 mg of potassium.  Honeydew melon -  cup has 200 mg of potassium.  Kale (raw) - 1 cup has 300 mg of potassium.  Kiwi - 1 medium fruit has 240 mg of potassium.  Kohlrabi, rutabaga, parsnips -  cup has 280 mg of potassium.  Lentils -  cup has 365 mg of potassium.  Mango - 1 each has 325 mg of potassium.  Milk (nonfat, low-fat, whole, buttermilk) - 1 cup has 350-380 mg of potassium.  Milk (chocolate) - 1 cup has 420 mg of potassium  Molasses - 1 Tbsp has 295 mg of potassium.  Mushrooms -  cup has 280 mg of potassium.  Nectarine - 1 each has 275 mg of potassium.  Nuts (almonds, peanuts, hazelnuts, [place], cashew, mixed) - 1 oz has 200 mg of potassium.  Nuts (pistachios) - 1 oz has 295 mg of potassium.  Orange - 1 fruit has 240 mg of potassium.  Orange juice -  cup has 235 mg of potassium.  Papaya -  medium fruit has 390 mg of potassium.  Peanut butter (chunky) - 2 Tbsp has 240 mg of potassium.  Peanut butter (smooth) - 2 Tbsp has 210 mg of potassium.  Pear - 1 medium (200 mg) of potassium.  Pomegranate - 1 whole fruit has 400 mg of potassium.  Pomegranate juice -  cup has 215 mg of potassium.  Pork - 3 oz has 350 mg of potassium.  Potato chips (salted) - 1 oz has 465 mg of potassium.  Potato (baked with skin) - 1 medium has 925 mg of potassium.  Potato (boiled) -  cup has 255 mg of potassium.  Potato (Mashed) -  cup has 330 mg of potassium.  Prune juice -  cup has 370 mg of potassium.  Prunes - 5 have 305 mg of potassium.  Pudding (chocolate) -  cup has 230 mg of potassium.  Pumpkin (canned) -  cup has 250 mg of potassium.  Raisins (seedless) -  cup has 270 mg of potassium.  Seeds (sunflower or pumpkin) - 1 oz has 240 mg of potassium.  Soy milk - 1 cup has 300 mg of potassium.  Spinach (cooked) - 1/2 cup has 420 mg of potassium.  Spinach (canned) -  cup has 370 mg of potassium.  Sweet  potato (baked with skin) - 1 medium has 450 mg of potassium.  Swiss chard -  cup has 480 mg of potassium.  Tomato or vegetable juice -  cup has 275 mg of potassium.  Tomato (sauce or puree) -  cup has 400-550 mg of potassium.  Tomato (raw) - 1 medium has 290 mg of potassium.  Tomato (canned) -  cup has 200-300 mg of potassium.  [place] - 3 oz has 250 mg of potassium.  Wheat germ - 1 oz has 250 mg of potassium.  Winter squash -  cup has 250 mg of potassium.  Yogurt (plain or fruited) - 6 oz has 260-435 mg of potassium.  Zucchini -  cup has 220 mg of potassium. Moderate in potassium The following foods and beverages have 50-200 mg of potassium per serving:  Apple - 1 fruit has 150 mg of potassium  Apple juice -  cup has 150 mg of potassium  Applesauce -  cup has 90 mg of potassium  Apricot nectar -  cup has 140 mg of potassium  Asparagus (small spears) -  cup has 155 mg of potassium  Asparagus (large spears) - 6 have 155 mg of potassium  Bagel (cinnamon raisin) - 1 four-inch bagel has 130 mg of potassium  Bagel (egg or plain) - 1 four- inch bagel has 70 mg of potassium  Beans (green) -  cup has 90 mg of potassium  Beans (yellow) -  cup has 190 mg of potassium  Beer, regular - 12 oz has 100 mg of potassium  Beets (canned) -  cup has 125 mg of potassium  Blackberries -  cup has 115 mg of potassium  Blueberries -  cup has 60 mg of potassium  Bread (whole wheat) - 1 slice has 70 mg of potassium  Broccoli (raw) -  cup has 145 mg of potassium  Cabbage -  cup has 150 mg of potassium  Carrots (cooked or raw) -  cup has 180 mg of potassium  Cauliflower (raw) -  cup has 150 mg of potassium  Celery (raw) -  cup has 155 mg of potassium  Cereal, bran flakes -  cup has 120-150 mg of potassium  Cheese (cottage) -  cup has 110 mg of potassium  Cherries - 10 have 150 mg of potassium  Chocolate - 1 oz bar has 165 mg of potassium  Coffee  (brewed) - 6 oz has 90 mg of potassium  Corn -  cup or 1 ear has 195 mg of potassium  Cucumbers -  cup has 80 mg of potassium  Egg - 1 large egg has 60 mg of potassium  Eggplant -  cup has 60 mg of potassium  Endive (raw) -  cup has 80 mg of potassium  English muffin - 1 has 65 mg of potassium  Fish (ocean perch) - 3 oz has 192 mg of potassium  Frankfurter, beef or pork - 1 has 75 mg of potassium  Fruit cocktail -  cup has 115 mg of potassium  Grape juice -  cup has 170 mg of potassium  Grapefruit -  fruit has 175 mg of potassium  Grapes -  cup has 155 mg of potassium  Greens: kale, turnip, collard -  cup has 110-150 mg of potassium  Ice cream or frozen yogurt (chocolate) -  cup has 175 mg of potassium  Ice cream or frozen yogurt (vanilla) -  cup has 120-150 mg of potassium  Lemons, limes - 1 each has 80 mg of potassium  Lettuce - 1 cup has 100 mg of potassium  Mixed vegetables -  cup has 150 mg of potassium  Mushrooms, raw -  cup has 110 mg of potassium  Nuts (walnuts, pecans, or macadamia) - 1 oz has 125 mg of potassium  Oatmeal -  cup has 80 mg of potassium  Okra -  cup has 110 mg of potassium  Onions -  cup has 120 mg of potassium  Peach - 1 has 185 mg of potassium  Peaches (canned) -  cup has 120 mg of potassium  Pears (canned) -  cup has 120 mg of potassium  Peas, green (frozen) -  cup has 90 mg of potassium  Peppers (Green) -  cup has 130 mg of potassium  Peppers (Red) -  cup has 160 mg of potassium  Pineapple juice -  cup has 165 mg of potassium  Pineapple (fresh or canned) -  cup has 100 mg of potassium  Plums - 1 has 105 mg of potassium  Pudding, vanilla -  cup has 150 mg of potassium  Raspberries -  cup has 90 mg of potassium  Rhubarb -  cup has 115 mg of potassium  Rice, wild -  cup has 80 mg of potassium  Shrimp - 3 oz has 155 mg of potassium  Spinach (raw) - 1 cup has 170 mg of  potassium  Strawberries -  cup has 125 mg of potassium  Summer squash -  cup has 175-200 mg of potassium  Swiss chard (raw) - 1 cup has 135 mg of potassium  Tangerines - 1 fruit has 140 mg of potassium  Tea, brewed - 6 oz has 65 mg of potassium  Turnips -  cup has 140 mg of potassium  Watermelon -  cup has 85 mg of potassium  Wine (Red, table) - 5 oz has 180 mg of potassium  Wine (White, table) - 5 oz 100 mg of potassium Low in potassium The following foods and beverages have less than 50 mg of potassium per serving.  Bread (white) - 1 slice has 30 mg of potassium  Carbonated beverages - 12 oz has less than 5 mg of potassium  Cheese - 1 oz has 20-30 mg of potassium  Cranberries -  cup has 45 mg of potassium  Cranberry juice cocktail -  cup has 20 mg of potassium  Fats and oils - 1 Tbsp has less than 5 mg of potassium  Hummus - 1 Tbsp has 32 mg of potassium  Nectar (papaya, mango, or pear) -  cup has 35 mg of potassium  Rice (white or brown) -  cup has 50 mg of potassium  Spaghetti or macaroni (cooked) -  cup has 30 mg of potassium  Tortilla, flour or corn - 1 has 50 mg of potassium  Waffle - 1 four-inch waffle has 50 mg of potassium  Water chestnuts -  cup has 40 mg of potassium Summary  Potassium is a mineral found in many foods and drinks. It affects how the heart works, and helps keep fluids and minerals balanced in the body.  The amount of potassium you need each day depends on your age and any existing medical conditions you may have. Your health care provider or dietitian may recommend an amount of potassium that you should have each day. This information is not intended to replace advice given to you by your health care provider. Make sure you discuss any questions you have with your health care provider. Document Revised: 12/05/2016 Document Reviewed: 03/19/2016 Elsevier Patient Education  2020 [employer] Thank you for choosing [facility]!

## 2019-07-13 ENCOUNTER — Ambulatory Visit (INDEPENDENT_AMBULATORY_CARE_PROVIDER_SITE_OTHER): Payer: BC Managed Care – PPO

## 2019-07-13 DIAGNOSIS — J309 Allergic rhinitis, unspecified: Secondary | ICD-10-CM | POA: Diagnosis not present

## 2019-07-20 ENCOUNTER — Ambulatory Visit (INDEPENDENT_AMBULATORY_CARE_PROVIDER_SITE_OTHER): Payer: BC Managed Care – PPO

## 2019-07-20 DIAGNOSIS — J309 Allergic rhinitis, unspecified: Secondary | ICD-10-CM

## 2019-07-27 ENCOUNTER — Ambulatory Visit (INDEPENDENT_AMBULATORY_CARE_PROVIDER_SITE_OTHER): Payer: BC Managed Care – PPO

## 2019-07-27 DIAGNOSIS — J309 Allergic rhinitis, unspecified: Secondary | ICD-10-CM | POA: Diagnosis not present

## 2019-08-03 ENCOUNTER — Ambulatory Visit (INDEPENDENT_AMBULATORY_CARE_PROVIDER_SITE_OTHER): Payer: BC Managed Care – PPO

## 2019-08-03 DIAGNOSIS — J309 Allergic rhinitis, unspecified: Secondary | ICD-10-CM

## 2019-08-10 ENCOUNTER — Ambulatory Visit: Payer: Self-pay

## 2019-08-10 ENCOUNTER — Ambulatory Visit (INDEPENDENT_AMBULATORY_CARE_PROVIDER_SITE_OTHER): Payer: BC Managed Care – PPO

## 2019-08-10 DIAGNOSIS — J309 Allergic rhinitis, unspecified: Secondary | ICD-10-CM

## 2019-08-17 ENCOUNTER — Ambulatory Visit (INDEPENDENT_AMBULATORY_CARE_PROVIDER_SITE_OTHER): Payer: BC Managed Care – PPO

## 2019-08-17 DIAGNOSIS — J309 Allergic rhinitis, unspecified: Secondary | ICD-10-CM | POA: Diagnosis not present

## 2019-08-24 ENCOUNTER — Ambulatory Visit (INDEPENDENT_AMBULATORY_CARE_PROVIDER_SITE_OTHER): Payer: BC Managed Care – PPO

## 2019-08-24 DIAGNOSIS — J309 Allergic rhinitis, unspecified: Secondary | ICD-10-CM

## 2019-08-30 ENCOUNTER — Other Ambulatory Visit: Payer: Self-pay | Admitting: Allergy & Immunology

## 2019-08-31 ENCOUNTER — Ambulatory Visit (INDEPENDENT_AMBULATORY_CARE_PROVIDER_SITE_OTHER): Payer: BC Managed Care – PPO

## 2019-08-31 DIAGNOSIS — J309 Allergic rhinitis, unspecified: Secondary | ICD-10-CM

## 2019-08-31 MED ORDER — LEVOCETIRIZINE DIHYDROCHLORIDE 5 MG PO TABS: ORAL_TABLET | ORAL | 0 refills | Status: DC

## 2019-09-06 ENCOUNTER — Other Ambulatory Visit: Payer: Self-pay | Admitting: Allergy & Immunology

## 2019-09-13 ENCOUNTER — Other Ambulatory Visit: Payer: Self-pay

## 2019-09-13 ENCOUNTER — Other Ambulatory Visit: Payer: BC Managed Care – PPO

## 2019-09-13 DIAGNOSIS — Z20822 Contact with and (suspected) exposure to covid-19: Secondary | ICD-10-CM

## 2019-09-14 ENCOUNTER — Ambulatory Visit (INDEPENDENT_AMBULATORY_CARE_PROVIDER_SITE_OTHER): Payer: BC Managed Care – PPO

## 2019-09-14 DIAGNOSIS — J309 Allergic rhinitis, unspecified: Secondary | ICD-10-CM

## 2019-09-14 LAB — NOVEL CORONAVIRUS, NAA: SARS-CoV-2, NAA: NOT DETECTED

## 2019-09-21 ENCOUNTER — Ambulatory Visit (INDEPENDENT_AMBULATORY_CARE_PROVIDER_SITE_OTHER): Payer: BC Managed Care – PPO

## 2019-09-21 DIAGNOSIS — J309 Allergic rhinitis, unspecified: Secondary | ICD-10-CM | POA: Diagnosis not present

## 2019-09-28 ENCOUNTER — Ambulatory Visit (INDEPENDENT_AMBULATORY_CARE_PROVIDER_SITE_OTHER): Payer: BC Managed Care – PPO

## 2019-09-28 DIAGNOSIS — J309 Allergic rhinitis, unspecified: Secondary | ICD-10-CM

## 2019-09-29 ENCOUNTER — Other Ambulatory Visit: Payer: Self-pay | Admitting: Allergy & Immunology

## 2019-10-05 ENCOUNTER — Encounter: Payer: Self-pay | Admitting: Family Medicine

## 2019-10-05 ENCOUNTER — Ambulatory Visit: Payer: Self-pay

## 2019-10-05 ENCOUNTER — Ambulatory Visit (INDEPENDENT_AMBULATORY_CARE_PROVIDER_SITE_OTHER): Payer: BC Managed Care – PPO | Admitting: Family Medicine

## 2019-10-05 ENCOUNTER — Other Ambulatory Visit: Payer: Self-pay

## 2019-10-05 VITALS — BP 92/60 | HR 70 | Resp 18 | Ht 67.0 in | Wt 153.4 lb

## 2019-10-05 DIAGNOSIS — J3089 Other allergic rhinitis: Secondary | ICD-10-CM

## 2019-10-05 DIAGNOSIS — J302 Other seasonal allergic rhinitis: Secondary | ICD-10-CM | POA: Diagnosis not present

## 2019-10-05 DIAGNOSIS — J4520 Mild intermittent asthma, uncomplicated: Secondary | ICD-10-CM

## 2019-10-05 DIAGNOSIS — J309 Allergic rhinitis, unspecified: Secondary | ICD-10-CM

## 2019-10-05 MED ORDER — LEVOCETIRIZINE DIHYDROCHLORIDE 5 MG PO TABS: ORAL_TABLET | ORAL | 3 refills | Status: DC

## 2019-10-05 NOTE — Patient Instructions (Signed)
Asthma  Continue albuterol 2 puffs every 4 hours as needed for cough or wheeze You may use albuterol 2 puffs 5 to 15 minutes before activity to prevent cough or wheeze For asthma flare, begin Asmanex 100-2 puffs twice a day for 1-2 weeks or until cough and wheeze free  Allergic rhinitis Continue allergen immunotherapy directed toward pollens, cat, dog, rabbit, mold, cockroach, and dust mite and have access to an epinephrine auto-injector Continue Xyzal 5 mg once a day as needed for a runny nose Continue Nasacort 2 sprays in each nostril once a day as needed for stuffy nose.  In the right nostril, point the applicator out toward the right ear. In the left nostril, point the applicator out toward the left ear Consider saline nasal rinses as needed for nasal symptoms. Use this before any medicated nasal sprays for best result  Call the clinic if this treatment plan is not working well for you  Follow up in 6 months or sooner if needed.

## 2019-10-05 NOTE — Progress Notes (Signed)
[address] [place] [address] Dept: [phone]  FOLLOW UP NOTE  Patient ID: Mariah Burnett, female    DOB: Dec 26, 1975  Age: 44 y.o. MRN: [id number] Date of Office Visit: 10/05/2019  Assessment  Chief Complaint: Allergic Rhinitis   HPI Mariah Burnett is a 44 year old female who presents to the clinic for a follow up visit. She was last seen in this clinic on 09/17/2018 by Dr. Dellis Anes for evaluation of asthma and allergic rhinitis. At today's visit, she reports that her asthma has been well controlled with no shortness of breath, cough, or wheeze with activity or rest. She reports that she last used her Asmanex and albuterol about 6 months ago. Allergic rhinitis is reported as well controlled with levocetirizine 5 mg once a day, azelastine as needed, and saline nasal rinses as needed. She continues allergen immunotherapy directed toward grass pollen, tree pollen, ragweed pollen, molds, cat, dog, rabbit, cockroach, and dust mites with no large local reactions. She reports a significant decrease in her symptoms of allergic rhinitis while continuing on allergen immunotherapy. Her current medications are listed in the chart.    Drug Allergies:  Allergies  Allergen Reactions  . Latex Rash    Physical Exam: BP 92/60   Pulse 70   Resp 18   Ht 5\' 7"  (1.702 m)   Wt 153 lb 6.4 oz (69.6 kg)   SpO2 97%   BMI 24.03 kg/m    Physical Exam Vitals reviewed.  Constitutional:      Appearance: Normal appearance.  HENT:     Head: Normocephalic and atraumatic.     Right Ear: Tympanic membrane normal.     Left Ear: Tympanic membrane normal.     Nose:     Comments: Bilateral nares normal. Pharynx normal. Ears normal. Eyes normal.    Mouth/Throat:     Pharynx: Oropharynx is clear.  Eyes:     Conjunctiva/sclera: Conjunctivae normal.  Cardiovascular:     Rate and Rhythm: Normal rate and regular rhythm.     Heart sounds: Normal heart sounds. No murmur heard.   Pulmonary:      Effort: Pulmonary effort is normal.     Breath sounds: Normal breath sounds.     Comments: Lungs clear to auscultation Musculoskeletal:        General: Normal range of motion.     Cervical back: Normal range of motion and neck supple.  Skin:    General: Skin is warm and dry.  Neurological:     Mental Status: She is alert and oriented to person, place, and time.  Psychiatric:        Mood and Affect: Mood normal.        Behavior: Behavior normal.        Thought Content: Thought content normal.        Judgment: Judgment normal.     Diagnostics: FVC 3.65, FEV1 2.80.  Predicted FVC 4.15, predicted FEV1 3.57.  Spirometry indicates normal ventilatory function.  Assessment and Plan: 1. Mild intermittent asthma without complication   2. Seasonal and perennial allergic rhinitis     Meds ordered this encounter  Medications  . levocetirizine (XYZAL) 5 MG tablet    Sig: TAKE 1 TABLET BY MOUTH EVERY DAY IN THE EVENING    Dispense:  90 tablet    Refill:  3    Patient Instructions  Asthma  Continue albuterol 2 puffs every 4 hours as needed for cough or wheeze You may use albuterol  2 puffs 5 to 15 minutes before activity to prevent cough or wheeze For asthma flare, begin Asmanex 100-2 puffs twice a day for 1-2 weeks or until cough and wheeze free  Allergic rhinitis Continue allergen immunotherapy directed toward pollens, cat, dog, rabbit, mold, cockroach, and dust mite and have access to an epinephrine auto-injector Continue Xyzal 5 mg once a day as needed for a runny nose Continue Nasacort 2 sprays in each nostril once a day as needed for stuffy nose.  In the right nostril, point the applicator out toward the right ear. In the left nostril, point the applicator out toward the left ear Consider saline nasal rinses as needed for nasal symptoms. Use this before any medicated nasal sprays for best result  Call the clinic if this treatment plan is not working well for you  Follow up in 6  months or sooner if needed.   Return in about 6 months (around 04/03/2020), or if symptoms worsen or fail to improve.    Thank you for the opportunity to care for this patient.  Please do not hesitate to contact me with questions.  Thermon Leyland, FNP [facility]

## 2019-10-12 ENCOUNTER — Ambulatory Visit (INDEPENDENT_AMBULATORY_CARE_PROVIDER_SITE_OTHER): Payer: BC Managed Care – PPO

## 2019-10-12 DIAGNOSIS — J309 Allergic rhinitis, unspecified: Secondary | ICD-10-CM | POA: Diagnosis not present

## 2019-10-13 DIAGNOSIS — J3089 Other allergic rhinitis: Secondary | ICD-10-CM | POA: Diagnosis not present

## 2019-10-13 NOTE — Progress Notes (Signed)
Vials exp 10-12-20 

## 2019-10-14 ENCOUNTER — Other Ambulatory Visit: Payer: Self-pay | Admitting: *Deleted

## 2019-10-14 MED ORDER — METOPROLOL TARTRATE 25 MG PO TABS: ORAL_TABLET | ORAL | 3 refills | Status: DC

## 2019-10-26 ENCOUNTER — Ambulatory Visit (INDEPENDENT_AMBULATORY_CARE_PROVIDER_SITE_OTHER): Payer: BC Managed Care – PPO

## 2019-10-26 DIAGNOSIS — J309 Allergic rhinitis, unspecified: Secondary | ICD-10-CM

## 2019-11-02 ENCOUNTER — Ambulatory Visit (INDEPENDENT_AMBULATORY_CARE_PROVIDER_SITE_OTHER): Payer: BC Managed Care – PPO

## 2019-11-02 DIAGNOSIS — J309 Allergic rhinitis, unspecified: Secondary | ICD-10-CM | POA: Diagnosis not present

## 2019-11-09 ENCOUNTER — Ambulatory Visit (INDEPENDENT_AMBULATORY_CARE_PROVIDER_SITE_OTHER): Payer: BC Managed Care – PPO

## 2019-11-09 DIAGNOSIS — J309 Allergic rhinitis, unspecified: Secondary | ICD-10-CM | POA: Diagnosis not present

## 2019-11-16 ENCOUNTER — Ambulatory Visit (INDEPENDENT_AMBULATORY_CARE_PROVIDER_SITE_OTHER): Payer: BC Managed Care – PPO

## 2019-11-16 DIAGNOSIS — J309 Allergic rhinitis, unspecified: Secondary | ICD-10-CM | POA: Diagnosis not present

## 2019-11-25 ENCOUNTER — Ambulatory Visit (INDEPENDENT_AMBULATORY_CARE_PROVIDER_SITE_OTHER): Payer: BC Managed Care – PPO

## 2019-11-25 DIAGNOSIS — J309 Allergic rhinitis, unspecified: Secondary | ICD-10-CM | POA: Diagnosis not present

## 2019-11-30 ENCOUNTER — Ambulatory Visit (INDEPENDENT_AMBULATORY_CARE_PROVIDER_SITE_OTHER): Payer: BC Managed Care – PPO

## 2019-11-30 DIAGNOSIS — J309 Allergic rhinitis, unspecified: Secondary | ICD-10-CM | POA: Diagnosis not present

## 2019-12-08 ENCOUNTER — Ambulatory Visit: Payer: Self-pay | Attending: Internal Medicine

## 2019-12-08 DIAGNOSIS — Z23 Encounter for immunization: Secondary | ICD-10-CM

## 2019-12-08 NOTE — Progress Notes (Signed)
   Covid-19 Vaccination Clinic  Name:  Mariah Burnett    MRN: [id number] DOB: 1975/02/15  12/08/2019  Mariah Burnett was observed post Covid-19 immunization for 30 minutes based on pre-vaccination screening without incident. She was provided with Vaccine Information Sheet and instruction to access the V-Safe system.   Mariah Burnett was instructed to call 911 with any severe reactions post vaccine: Marland Kitchen Difficulty breathing  . Swelling of face and throat  . A fast heartbeat  . A bad rash all over body  . Dizziness and weakness   Immunizations Administered    Name Date Dose VIS Date Route   Pfizer COVID-19 Vaccine 12/08/2019  1:41 PM 0.3 mL 10/26/2019 Intramuscular   Manufacturer: [employer], [employer]   Lot: [id number]   NDC: [address]-2

## 2019-12-14 ENCOUNTER — Ambulatory Visit (INDEPENDENT_AMBULATORY_CARE_PROVIDER_SITE_OTHER): Payer: BC Managed Care – PPO

## 2019-12-14 DIAGNOSIS — J309 Allergic rhinitis, unspecified: Secondary | ICD-10-CM | POA: Diagnosis not present

## 2019-12-28 ENCOUNTER — Ambulatory Visit (INDEPENDENT_AMBULATORY_CARE_PROVIDER_SITE_OTHER): Payer: BC Managed Care – PPO

## 2019-12-28 DIAGNOSIS — J309 Allergic rhinitis, unspecified: Secondary | ICD-10-CM | POA: Diagnosis not present

## 2020-01-05 ENCOUNTER — Ambulatory Visit: Payer: Self-pay

## 2020-01-05 ENCOUNTER — Ambulatory Visit: Payer: Self-pay | Attending: Internal Medicine

## 2020-01-05 DIAGNOSIS — Z23 Encounter for immunization: Secondary | ICD-10-CM

## 2020-01-05 NOTE — Progress Notes (Signed)
   Covid-19 Vaccination Clinic  Name:  Mariah Burnett    MRN: [id number] DOB: February 26, 1975  01/05/2020  Mariah Burnett was observed post Covid-19 immunization for 15 minutes without incident. She was provided with Vaccine Information Sheet and instruction to access the V-Safe system.   Mariah Burnett was instructed to call 911 with any severe reactions post vaccine: Marland Kitchen Difficulty breathing  . Swelling of face and throat  . A fast heartbeat  . A bad rash all over body  . Dizziness and weakness   Immunizations Administered    Name Date Dose VIS Date Route   Pfizer COVID-19 Vaccine 01/05/2020  2:31 PM 0.3 mL 10/26/2019 Intramuscular   Manufacturer: [employer], [employer]   Lot: [id number]   NDC: [address]-6

## 2020-01-11 ENCOUNTER — Ambulatory Visit (INDEPENDENT_AMBULATORY_CARE_PROVIDER_SITE_OTHER): Payer: BC Managed Care – PPO

## 2020-01-11 DIAGNOSIS — J309 Allergic rhinitis, unspecified: Secondary | ICD-10-CM | POA: Diagnosis not present

## 2020-01-25 ENCOUNTER — Ambulatory Visit (INDEPENDENT_AMBULATORY_CARE_PROVIDER_SITE_OTHER): Payer: BC Managed Care – PPO

## 2020-01-25 DIAGNOSIS — J309 Allergic rhinitis, unspecified: Secondary | ICD-10-CM | POA: Diagnosis not present

## 2020-02-06 DIAGNOSIS — J3089 Other allergic rhinitis: Secondary | ICD-10-CM

## 2020-02-06 NOTE — Progress Notes (Signed)
VIALS EXP 02-05-21 

## 2020-02-08 ENCOUNTER — Ambulatory Visit (INDEPENDENT_AMBULATORY_CARE_PROVIDER_SITE_OTHER): Payer: BC Managed Care – PPO

## 2020-02-08 DIAGNOSIS — J309 Allergic rhinitis, unspecified: Secondary | ICD-10-CM

## 2020-02-22 ENCOUNTER — Ambulatory Visit (INDEPENDENT_AMBULATORY_CARE_PROVIDER_SITE_OTHER): Payer: BC Managed Care – PPO

## 2020-02-22 DIAGNOSIS — J309 Allergic rhinitis, unspecified: Secondary | ICD-10-CM | POA: Diagnosis not present

## 2020-03-09 ENCOUNTER — Ambulatory Visit (INDEPENDENT_AMBULATORY_CARE_PROVIDER_SITE_OTHER): Payer: Self-pay

## 2020-03-09 DIAGNOSIS — J309 Allergic rhinitis, unspecified: Secondary | ICD-10-CM | POA: Diagnosis not present

## 2020-03-14 ENCOUNTER — Ambulatory Visit (INDEPENDENT_AMBULATORY_CARE_PROVIDER_SITE_OTHER): Payer: BC Managed Care – PPO

## 2020-03-14 DIAGNOSIS — J309 Allergic rhinitis, unspecified: Secondary | ICD-10-CM | POA: Diagnosis not present

## 2020-03-21 ENCOUNTER — Ambulatory Visit (INDEPENDENT_AMBULATORY_CARE_PROVIDER_SITE_OTHER): Payer: BC Managed Care – PPO | Admitting: *Deleted

## 2020-03-21 DIAGNOSIS — J309 Allergic rhinitis, unspecified: Secondary | ICD-10-CM

## 2020-03-28 ENCOUNTER — Ambulatory Visit (INDEPENDENT_AMBULATORY_CARE_PROVIDER_SITE_OTHER): Payer: BC Managed Care – PPO

## 2020-03-28 DIAGNOSIS — J309 Allergic rhinitis, unspecified: Secondary | ICD-10-CM

## 2020-04-02 ENCOUNTER — Ambulatory Visit: Payer: Self-pay | Admitting: Cardiology

## 2020-04-04 ENCOUNTER — Ambulatory Visit (INDEPENDENT_AMBULATORY_CARE_PROVIDER_SITE_OTHER): Payer: BC Managed Care – PPO

## 2020-04-04 DIAGNOSIS — J309 Allergic rhinitis, unspecified: Secondary | ICD-10-CM | POA: Diagnosis not present

## 2020-04-18 ENCOUNTER — Ambulatory Visit (INDEPENDENT_AMBULATORY_CARE_PROVIDER_SITE_OTHER): Payer: BC Managed Care – PPO

## 2020-04-18 DIAGNOSIS — J309 Allergic rhinitis, unspecified: Secondary | ICD-10-CM | POA: Diagnosis not present

## 2020-04-23 DIAGNOSIS — R42 Dizziness and giddiness: Secondary | ICD-10-CM | POA: Insufficient documentation

## 2020-04-23 DIAGNOSIS — H93293 Other abnormal auditory perceptions, bilateral: Secondary | ICD-10-CM | POA: Insufficient documentation

## 2020-04-25 ENCOUNTER — Ambulatory Visit (HOSPITAL_COMMUNITY): Payer: Self-pay

## 2020-05-02 ENCOUNTER — Ambulatory Visit (INDEPENDENT_AMBULATORY_CARE_PROVIDER_SITE_OTHER): Payer: BC Managed Care – PPO

## 2020-05-02 DIAGNOSIS — J309 Allergic rhinitis, unspecified: Secondary | ICD-10-CM

## 2020-05-29 ENCOUNTER — Other Ambulatory Visit (HOSPITAL_COMMUNITY): Payer: Self-pay | Admitting: Obstetrics & Gynecology

## 2020-05-29 DIAGNOSIS — Z1231 Encounter for screening mammogram for malignant neoplasm of breast: Secondary | ICD-10-CM

## 2020-05-30 ENCOUNTER — Ambulatory Visit (INDEPENDENT_AMBULATORY_CARE_PROVIDER_SITE_OTHER): Payer: BC Managed Care – PPO

## 2020-05-30 DIAGNOSIS — J309 Allergic rhinitis, unspecified: Secondary | ICD-10-CM | POA: Diagnosis not present

## 2020-06-07 DIAGNOSIS — M542 Cervicalgia: Secondary | ICD-10-CM | POA: Diagnosis not present

## 2020-06-07 DIAGNOSIS — R531 Weakness: Secondary | ICD-10-CM | POA: Diagnosis not present

## 2020-06-07 DIAGNOSIS — M25512 Pain in left shoulder: Secondary | ICD-10-CM | POA: Diagnosis not present

## 2020-06-07 DIAGNOSIS — R293 Abnormal posture: Secondary | ICD-10-CM | POA: Diagnosis not present

## 2020-06-13 ENCOUNTER — Ambulatory Visit (INDEPENDENT_AMBULATORY_CARE_PROVIDER_SITE_OTHER): Payer: BC Managed Care – PPO

## 2020-06-13 DIAGNOSIS — J309 Allergic rhinitis, unspecified: Secondary | ICD-10-CM

## 2020-06-14 DIAGNOSIS — M542 Cervicalgia: Secondary | ICD-10-CM | POA: Diagnosis not present

## 2020-06-14 DIAGNOSIS — R531 Weakness: Secondary | ICD-10-CM | POA: Diagnosis not present

## 2020-06-14 DIAGNOSIS — M25512 Pain in left shoulder: Secondary | ICD-10-CM | POA: Diagnosis not present

## 2020-06-14 DIAGNOSIS — R293 Abnormal posture: Secondary | ICD-10-CM | POA: Diagnosis not present

## 2020-06-27 ENCOUNTER — Ambulatory Visit (INDEPENDENT_AMBULATORY_CARE_PROVIDER_SITE_OTHER): Payer: BC Managed Care – PPO

## 2020-06-27 DIAGNOSIS — J309 Allergic rhinitis, unspecified: Secondary | ICD-10-CM | POA: Diagnosis not present

## 2020-06-28 DIAGNOSIS — J3081 Allergic rhinitis due to animal (cat) (dog) hair and dander: Secondary | ICD-10-CM | POA: Diagnosis not present

## 2020-06-28 NOTE — Progress Notes (Signed)
VIALS MADE. EXP 06-28-21 

## 2020-07-02 ENCOUNTER — Ambulatory Visit (HOSPITAL_COMMUNITY)
Admission: RE | Admit: 2020-07-02 | Discharge: 2020-07-02 | Disposition: A | Discharge status: Home / Self Care | Payer: BC Managed Care – PPO | Source: Ambulatory Visit | Attending: Obstetrics & Gynecology | Admitting: Obstetrics & Gynecology

## 2020-07-02 ENCOUNTER — Other Ambulatory Visit: Payer: Self-pay

## 2020-07-02 DIAGNOSIS — Z1231 Encounter for screening mammogram for malignant neoplasm of breast: Secondary | ICD-10-CM | POA: Diagnosis not present

## 2020-07-03 DIAGNOSIS — R531 Weakness: Secondary | ICD-10-CM | POA: Diagnosis not present

## 2020-07-03 DIAGNOSIS — R293 Abnormal posture: Secondary | ICD-10-CM | POA: Diagnosis not present

## 2020-07-03 DIAGNOSIS — M25512 Pain in left shoulder: Secondary | ICD-10-CM | POA: Diagnosis not present

## 2020-07-03 DIAGNOSIS — M542 Cervicalgia: Secondary | ICD-10-CM | POA: Diagnosis not present

## 2020-07-11 ENCOUNTER — Ambulatory Visit (INDEPENDENT_AMBULATORY_CARE_PROVIDER_SITE_OTHER): Payer: BC Managed Care – PPO

## 2020-07-11 DIAGNOSIS — J309 Allergic rhinitis, unspecified: Secondary | ICD-10-CM | POA: Diagnosis not present

## 2020-08-08 ENCOUNTER — Ambulatory Visit (INDEPENDENT_AMBULATORY_CARE_PROVIDER_SITE_OTHER): Payer: BC Managed Care – PPO

## 2020-08-08 DIAGNOSIS — J309 Allergic rhinitis, unspecified: Secondary | ICD-10-CM | POA: Diagnosis not present

## 2020-08-15 ENCOUNTER — Ambulatory Visit (INDEPENDENT_AMBULATORY_CARE_PROVIDER_SITE_OTHER): Payer: BC Managed Care – PPO | Admitting: *Deleted

## 2020-08-15 DIAGNOSIS — J309 Allergic rhinitis, unspecified: Secondary | ICD-10-CM | POA: Diagnosis not present

## 2020-08-21 DIAGNOSIS — F4321 Adjustment disorder with depressed mood: Secondary | ICD-10-CM | POA: Diagnosis not present

## 2020-08-21 DIAGNOSIS — F419 Anxiety disorder, unspecified: Secondary | ICD-10-CM | POA: Diagnosis not present

## 2020-08-21 DIAGNOSIS — F321 Major depressive disorder, single episode, moderate: Secondary | ICD-10-CM | POA: Diagnosis not present

## 2020-08-21 DIAGNOSIS — Z634 Disappearance and death of family member: Secondary | ICD-10-CM | POA: Diagnosis not present

## 2020-08-22 ENCOUNTER — Ambulatory Visit (INDEPENDENT_AMBULATORY_CARE_PROVIDER_SITE_OTHER): Payer: BC Managed Care – PPO

## 2020-08-22 DIAGNOSIS — J309 Allergic rhinitis, unspecified: Secondary | ICD-10-CM

## 2020-08-31 ENCOUNTER — Ambulatory Visit (INDEPENDENT_AMBULATORY_CARE_PROVIDER_SITE_OTHER): Payer: BC Managed Care – PPO

## 2020-08-31 DIAGNOSIS — J309 Allergic rhinitis, unspecified: Secondary | ICD-10-CM

## 2020-09-05 ENCOUNTER — Ambulatory Visit (INDEPENDENT_AMBULATORY_CARE_PROVIDER_SITE_OTHER): Payer: BC Managed Care – PPO

## 2020-09-05 DIAGNOSIS — J309 Allergic rhinitis, unspecified: Secondary | ICD-10-CM

## 2020-09-12 DIAGNOSIS — F4321 Adjustment disorder with depressed mood: Secondary | ICD-10-CM | POA: Diagnosis not present

## 2020-09-12 DIAGNOSIS — F321 Major depressive disorder, single episode, moderate: Secondary | ICD-10-CM | POA: Diagnosis not present

## 2020-09-12 DIAGNOSIS — F419 Anxiety disorder, unspecified: Secondary | ICD-10-CM | POA: Diagnosis not present

## 2020-09-14 ENCOUNTER — Ambulatory Visit (INDEPENDENT_AMBULATORY_CARE_PROVIDER_SITE_OTHER): Payer: BC Managed Care – PPO

## 2020-09-14 DIAGNOSIS — J309 Allergic rhinitis, unspecified: Secondary | ICD-10-CM

## 2020-09-26 ENCOUNTER — Ambulatory Visit (INDEPENDENT_AMBULATORY_CARE_PROVIDER_SITE_OTHER): Payer: BC Managed Care – PPO

## 2020-09-26 DIAGNOSIS — J309 Allergic rhinitis, unspecified: Secondary | ICD-10-CM | POA: Diagnosis not present

## 2020-10-05 DIAGNOSIS — M84374A Stress fracture, right foot, initial encounter for fracture: Secondary | ICD-10-CM | POA: Diagnosis not present

## 2020-10-05 DIAGNOSIS — M79671 Pain in right foot: Secondary | ICD-10-CM | POA: Diagnosis not present

## 2020-10-16 DIAGNOSIS — M79671 Pain in right foot: Secondary | ICD-10-CM | POA: Diagnosis not present

## 2020-10-16 DIAGNOSIS — M7741 Metatarsalgia, right foot: Secondary | ICD-10-CM | POA: Diagnosis not present

## 2020-10-17 ENCOUNTER — Ambulatory Visit (INDEPENDENT_AMBULATORY_CARE_PROVIDER_SITE_OTHER): Payer: BC Managed Care – PPO

## 2020-10-17 DIAGNOSIS — J309 Allergic rhinitis, unspecified: Secondary | ICD-10-CM

## 2020-10-30 ENCOUNTER — Other Ambulatory Visit: Payer: Self-pay | Admitting: Family Medicine

## 2020-10-31 ENCOUNTER — Ambulatory Visit (INDEPENDENT_AMBULATORY_CARE_PROVIDER_SITE_OTHER): Payer: BC Managed Care – PPO

## 2020-10-31 ENCOUNTER — Other Ambulatory Visit: Payer: Self-pay | Admitting: Student

## 2020-10-31 DIAGNOSIS — J309 Allergic rhinitis, unspecified: Secondary | ICD-10-CM

## 2020-11-01 ENCOUNTER — Other Ambulatory Visit (HOSPITAL_COMMUNITY): Payer: Self-pay | Admitting: Podiatry

## 2020-11-01 DIAGNOSIS — M84374A Stress fracture, right foot, initial encounter for fracture: Secondary | ICD-10-CM

## 2020-11-05 DIAGNOSIS — J3081 Allergic rhinitis due to animal (cat) (dog) hair and dander: Secondary | ICD-10-CM | POA: Diagnosis not present

## 2020-11-05 NOTE — Progress Notes (Signed)
VIALS MADE. EXP 11-05-21 

## 2020-11-12 DIAGNOSIS — Z1152 Encounter for screening for COVID-19: Secondary | ICD-10-CM | POA: Diagnosis not present

## 2020-11-12 DIAGNOSIS — R059 Cough, unspecified: Secondary | ICD-10-CM | POA: Diagnosis not present

## 2020-11-12 DIAGNOSIS — J209 Acute bronchitis, unspecified: Secondary | ICD-10-CM | POA: Diagnosis not present

## 2020-11-12 DIAGNOSIS — J040 Acute laryngitis: Secondary | ICD-10-CM | POA: Diagnosis not present

## 2020-11-14 ENCOUNTER — Ambulatory Visit (INDEPENDENT_AMBULATORY_CARE_PROVIDER_SITE_OTHER): Payer: BC Managed Care – PPO

## 2020-11-14 DIAGNOSIS — J309 Allergic rhinitis, unspecified: Secondary | ICD-10-CM

## 2020-11-16 ENCOUNTER — Encounter (HOSPITAL_COMMUNITY): Payer: Self-pay

## 2020-11-16 ENCOUNTER — Ambulatory Visit (HOSPITAL_COMMUNITY): Payer: BC Managed Care – PPO

## 2020-11-28 ENCOUNTER — Other Ambulatory Visit: Payer: Self-pay | Admitting: Student

## 2020-11-28 ENCOUNTER — Telehealth: Payer: Self-pay | Admitting: Family Medicine

## 2020-11-28 NOTE — Telephone Encounter (Signed)
This is a [place] pt.  °

## 2020-12-05 ENCOUNTER — Ambulatory Visit (INDEPENDENT_AMBULATORY_CARE_PROVIDER_SITE_OTHER): Payer: BC Managed Care – PPO

## 2020-12-05 DIAGNOSIS — J309 Allergic rhinitis, unspecified: Secondary | ICD-10-CM | POA: Diagnosis not present

## 2020-12-24 ENCOUNTER — Other Ambulatory Visit: Payer: Self-pay | Admitting: *Deleted

## 2020-12-24 MED ORDER — LEVOCETIRIZINE DIHYDROCHLORIDE 5 MG PO TABS: 5.0000 mg | ORAL_TABLET | Freq: Every evening | ORAL | 0 refills | Status: DC

## 2020-12-24 NOTE — Telephone Encounter (Signed)
Courtesy refill has been sent in. Called patient and advised that she will need to keep her appointment for further refills. Patient verbalized understanding and stated that when she comes in for her appointment her medications will need to be 90 days due to insurance. I advised I will make sure that the staff is aware.

## 2020-12-24 NOTE — Telephone Encounter (Signed)
Patient called to see if she can get the following medication filled. She is scheduled to see Thurston Hole 01/14/2021.

## 2020-12-26 ENCOUNTER — Ambulatory Visit (INDEPENDENT_AMBULATORY_CARE_PROVIDER_SITE_OTHER): Payer: BC Managed Care – PPO

## 2020-12-26 DIAGNOSIS — J309 Allergic rhinitis, unspecified: Secondary | ICD-10-CM

## 2021-01-14 ENCOUNTER — Other Ambulatory Visit: Payer: Self-pay | Admitting: Family Medicine

## 2021-01-14 ENCOUNTER — Ambulatory Visit: Payer: BC Managed Care – PPO | Admitting: Family Medicine

## 2021-01-14 ENCOUNTER — Other Ambulatory Visit: Payer: Self-pay

## 2021-01-14 ENCOUNTER — Encounter: Payer: Self-pay | Admitting: Family Medicine

## 2021-01-14 VITALS — BP 116/70 | HR 67 | Temp 98.7°F | Resp 16 | Ht 67.0 in | Wt 152.0 lb

## 2021-01-14 DIAGNOSIS — J3089 Other allergic rhinitis: Secondary | ICD-10-CM

## 2021-01-14 DIAGNOSIS — J4520 Mild intermittent asthma, uncomplicated: Secondary | ICD-10-CM | POA: Insufficient documentation

## 2021-01-14 DIAGNOSIS — J302 Other seasonal allergic rhinitis: Secondary | ICD-10-CM

## 2021-01-14 MED ORDER — ALBUTEROL SULFATE HFA 108 (90 BASE) MCG/ACT IN AERS: 2.0000 | INHALATION_SPRAY | Freq: Four times a day (QID) | RESPIRATORY_TRACT | 1 refills | Status: DC | PRN

## 2021-01-14 MED ORDER — AZELASTINE HCL 137 MCG/SPRAY NA SOLN: 2.0000 | Freq: Two times a day (BID) | NASAL | 4 refills | Status: DC | PRN

## 2021-01-14 MED ORDER — LEVOCETIRIZINE DIHYDROCHLORIDE 5 MG PO TABS: 5.0000 mg | ORAL_TABLET | Freq: Two times a day (BID) | ORAL | 1 refills | Status: DC | PRN

## 2021-01-14 MED ORDER — ASMANEX (120 METERED DOSES) 220 MCG/ACT IN AEPB: 2.0000 | INHALATION_SPRAY | Freq: Every day | RESPIRATORY_TRACT | 1 refills | Status: DC

## 2021-01-14 MED ORDER — EPINEPHRINE 0.3 MG/0.3ML IJ SOAJ: 0.3000 mg | Freq: Once | INTRAMUSCULAR | 1 refills | Status: AC

## 2021-01-14 NOTE — Telephone Encounter (Signed)
Ok to switch to levalbuterol. Thank you

## 2021-01-14 NOTE — Patient Instructions (Addendum)
Asthma  Continue albuterol 2 puffs every 4 hours as needed for cough or wheeze You may use albuterol 2 puffs 5 to 15 minutes before activity to prevent cough or wheeze For asthma flare, begin Asmanex 100-2 puffs twice a day for 2 weeks or until cough and wheeze free  Allergic rhinitis Continue allergen immunotherapy directed toward pollens, cat, dog, rabbit, mold, cockroach, and dust mite and have access to an epinephrine auto-injector set Continue Xyzal 5 mg once a day as needed for a runny nose Continue Nasacort 2 sprays in each nostril once a day as needed for stuffy nose.  In the right nostril, point the applicator out toward the right ear. In the left nostril, point the applicator out toward the left ear Consider saline nasal rinses as needed for nasal symptoms. Use this before any medicated nasal sprays for best result  Call the clinic if this treatment plan is not working well for you  Follow up in 6 months or sooner if needed  Reducing Pollen Exposure The American Academy of Allergy, Asthma and Immunology suggests the following steps to reduce your exposure to pollen during allergy seasons. Do not hang sheets or clothing out to dry; pollen may collect on these items. Do not mow lawns or spend time around freshly cut grass; mowing stirs up pollen. Keep windows closed at night.  Keep car windows closed while driving. Minimize morning activities outdoors, a time when pollen counts are usually at their highest. Stay indoors as much as possible when pollen counts or humidity is high and on windy days when pollen tends to remain in the air longer. Use air conditioning when possible.  Many air conditioners have filters that trap the pollen spores. Use a HEPA room air filter to remove pollen form the indoor air you breathe.  Control of Dog or Cat Allergen Avoidance is the best way to manage a dog or cat allergy. If you have a dog or cat and are allergic to dog or cats, consider removing the  dog or cat from the home. If you have a dog or cat but dont want to find it a new home, or if your family wants a pet even though someone in the household is allergic, here are some strategies that may help keep symptoms at bay:  Keep the pet out of your bedroom and restrict it to only a few rooms. Be advised that keeping the dog or cat in only one room will not limit the allergens to that room. Dont pet, hug or kiss the dog or cat; if you do, wash your hands with soap and water. High-efficiency particulate air (HEPA) cleaners run continuously in a bedroom or living room can reduce allergen levels over time. Regular use of a high-efficiency vacuum cleaner or a central vacuum can reduce allergen levels. Giving your dog or cat a bath at least once a week can reduce airborne allergen.  Control of Mold Allergen Mold and fungi can grow on a variety of surfaces provided certain temperature and moisture conditions exist.  Outdoor molds grow on plants, decaying vegetation and soil.  The major outdoor mold, Alternaria and Cladosporium, are found in very high numbers during hot and dry conditions.  Generally, a late Summer - Fall peak is seen for common outdoor fungal spores.  Rain will temporarily lower outdoor mold spore count, but counts rise rapidly when the rainy period ends.  The most important indoor molds are Aspergillus and Penicillium.  Dark, humid and poorly ventilated  basements are ideal sites for mold growth.  The next most common sites of mold growth are the bathroom and the kitchen.  Outdoor [employer] Use air conditioning and keep windows closed Avoid exposure to decaying vegetation. Avoid leaf raking. Avoid grain handling. Consider wearing a face mask if working in moldy areas.  Indoor Mold Control Maintain humidity below 50%. Clean washable surfaces with 5% bleach solution. Remove sources e.g. Contaminated carpets.  Control of Cockroach Allergen Cockroach allergen has been  identified as an important cause of acute attacks of asthma, especially in urban settings.  There are fifty-five species of cockroach that exist in the [place], however only three, the [place], [place] species produce allergen that can affect patients with Asthma.  Allergens can be obtained from fecal particles, egg casings and secretions from cockroaches.    Remove food sources. Reduce access to water. Seal access and entry points. Spray runways with 0.5-1% Diazinon or Chlorpyrifos Blow boric acid power under stoves and refrigerator. Place bait stations (hydramethylnon) at feeding sites.    Control of Dust Mite Allergen Dust mites play a major role in allergic asthma and rhinitis. They occur in environments with high humidity wherever human skin is found. Dust mites absorb humidity from the atmosphere (ie, they do not drink) and feed on organic matter (including shed human and animal skin). Dust mites are a microscopic type of insect that you cannot see with the naked eye. High levels of dust mites have been detected from mattresses, pillows, carpets, upholstered furniture, bed covers, clothes, soft toys and any woven material. The principal allergen of the dust mite is found in its feces. A gram of dust may contain 1,000 mites and 250,000 fecal particles. Mite antigen is easily measured in the air during house cleaning activities. Dust mites do not bite and do not cause harm to humans, other than by triggering allergies/asthma.  Ways to decrease your exposure to dust mites in your home:  1. Encase mattresses, box springs and pillows with a mite-impermeable barrier or cover  2. Wash sheets, blankets and drapes weekly in hot water (130 F) with detergent and dry them in a dryer on the hot setting.  3. Have the room cleaned frequently with a vacuum cleaner and a damp dust-mop. For carpeting or rugs, vacuuming with a vacuum cleaner equipped with a high-efficiency particulate air  (HEPA) filter. The dust mite allergic individual should not be in a room which is being cleaned and should wait 1 hour after cleaning before going into the room.  4. Do not sleep on upholstered furniture (eg, couches).  5. If possible removing carpeting, upholstered furniture and drapery from the home is ideal. Horizontal blinds should be eliminated in the rooms where the person spends the most time (bedroom, study, television room). Washable vinyl, roller-type shades are optimal.  6. Remove all non-washable stuffed toys from the bedroom. Wash stuffed toys weekly like sheets and blankets above.  7. Reduce indoor humidity to less than 50%. Inexpensive humidity monitors can be purchased at most hardware stores. Do not use a humidifier as can make the problem worse and are not recommended.

## 2021-01-14 NOTE — Addendum Note (Signed)
Addended by: Robet Leu A on: 01/14/2021 04:11 PM   Modules accepted: Orders

## 2021-01-14 NOTE — Progress Notes (Signed)
[address] [place] [address] Dept: [phone]  FOLLOW UP NOTE  Patient ID: Mariah Burnett, female    DOB: 08/27/1975  Age: 46 y.o. MRN: [id number] Date of Office Visit: 01/14/2021  Assessment  Chief Complaint: Asthma (Says it is well. No issues. ) and Allergic Rhinitis  (Says she has been sneezing a lot. Says immunotherapy is well also. )  HPI Mariah Burnett is a 46 year old female who presents to the clinic for follow-up visit.  She was last seen in this clinic on 10/05/2019 by Thermon Leyland, FNP, for evaluation of asthma and allergic rhinitis on allergen immunotherapy.  At today's visit, she reports her asthma has been well controlled with no shortness of breath, cough, or wheeze with activity or rest.  She continues albuterol infrequently with relief of symptoms.  She denies palpitations with the use of albuterol.  She does report that several weeks ago she had a bout with bronchitis for which she used albuterol several times a day in addition to needing prednisone and an antibiotic.  Allergic rhinitis is reported as moderately well controlled with symptoms including occasional rhinorrhea and occasional sneezing.  She continues Xyzal 5 mg once a day and is not currently using nasal saline or nasal steroid spray.  She continues allergen immunotherapy with occasional large and local reactions especially occurring in the spring and fall. She began immunotherapy directed toward pollens, cat, dog, rabbit, mold, cockroach, and dust mite on 09/22/2018.  She reports a significant decrease in her symptoms while continuing on allergen immunotherapy.  Her current medications are listed in the chart.   Drug Allergies:  Allergies  Allergen Reactions   Latex Rash    Physical Exam: BP 116/70    Pulse 67    Temp 98.7 F (37.1 C) (Temporal)    Resp 16    Ht 5\' 7"  (1.702 m)    Wt 152 lb (68.9 kg)    SpO2 98%    BMI 23.81 kg/m    Physical Exam Vitals reviewed.  Constitutional:       Appearance: Normal appearance.  HENT:     Head: Normocephalic and atraumatic.     Right Ear: Tympanic membrane normal.     Left Ear: Tympanic membrane normal.     Nose:     Comments: Bilateral nares slightly erythematous with clear nasal drainage noted.  Pharynx normal.  Ears normal.  Eyes normal.    Mouth/Throat:     Pharynx: Oropharynx is clear.  Eyes:     Conjunctiva/sclera: Conjunctivae normal.  Cardiovascular:     Rate and Rhythm: Normal rate and regular rhythm.     Heart sounds: Normal heart sounds. No murmur heard. Pulmonary:     Effort: Pulmonary effort is normal.     Breath sounds: Normal breath sounds.     Comments: Lungs clear to auscultation Musculoskeletal:        General: Normal range of motion.     Cervical back: Normal range of motion and neck supple.  Skin:    General: Skin is warm and dry.  Neurological:     Mental Status: She is alert and oriented to person, place, and time.  Psychiatric:        Mood and Affect: Mood normal.        Behavior: Behavior normal.        Thought Content: Thought content normal.        Judgment: Judgment normal.    Diagnostics: FVC 3.23, FEV1 2.46.  Predicted  FVC 3.94, predicted FEV1 3.15.  Spirometry indicates normal ventilatory function.  Assessment and Plan: 1. Mild intermittent asthma without complication   2. Seasonal and perennial allergic rhinitis     Meds ordered this encounter  Medications   levocetirizine (XYZAL) 5 MG tablet    Sig: Take 1 tablet (5 mg total) by mouth 2 (two) times daily as needed for allergies (Can ytake an extra dose during flare ups.).    Dispense:  180 tablet    Refill:  1   albuterol (VENTOLIN HFA) 108 (90 Base) MCG/ACT inhaler    Sig: Inhale 2 puffs into the lungs every 6 (six) hours as needed for wheezing or shortness of breath.    Dispense:  54 g    Refill:  1   Azelastine HCl 137 MCG/SPRAY SOLN    Sig: Place 2 sprays into the nose 2 (two) times daily as needed.    Dispense:  90 mL     Refill:  4   mometasone (ASMANEX, 120 METERED DOSES,) 220 MCG/ACT inhaler    Sig: Inhale 2 puffs into the lungs daily.    Dispense:  3 each    Refill:  1    Please hold until patient request it.    Patient Instructions  Asthma  Continue albuterol 2 puffs every 4 hours as needed for cough or wheeze You may use albuterol 2 puffs 5 to 15 minutes before activity to prevent cough or wheeze For asthma flare, begin Asmanex 100-2 puffs twice a day for 2 weeks or until cough and wheeze free  Allergic rhinitis Continue allergen immunotherapy directed toward pollens, cat, dog, rabbit, mold, cockroach, and dust mite and have access to an epinephrine auto-injector set Continue Xyzal 5 mg once a day as needed for a runny nose Continue Nasacort 2 sprays in each nostril once a day as needed for stuffy nose.  In the right nostril, point the applicator out toward the right ear. In the left nostril, point the applicator out toward the left ear Consider saline nasal rinses as needed for nasal symptoms. Use this before any medicated nasal sprays for best result  Call the clinic if this treatment plan is not working well for you  Follow up in 6 months or sooner if needed   Return in about 6 months (around 07/14/2021), or if symptoms worsen or fail to improve.    Thank you for the opportunity to care for this patient.  Please do not hesitate to contact me with questions.  Thermon Leyland, FNP [facility]

## 2021-01-14 NOTE — Telephone Encounter (Signed)
Please advise to change from ventolin to levalbuterol

## 2021-01-17 ENCOUNTER — Other Ambulatory Visit: Payer: Self-pay | Admitting: Family Medicine

## 2021-01-18 ENCOUNTER — Ambulatory Visit (INDEPENDENT_AMBULATORY_CARE_PROVIDER_SITE_OTHER): Payer: BC Managed Care – PPO

## 2021-01-18 DIAGNOSIS — J309 Allergic rhinitis, unspecified: Secondary | ICD-10-CM

## 2021-01-22 DIAGNOSIS — M546 Pain in thoracic spine: Secondary | ICD-10-CM | POA: Diagnosis not present

## 2021-01-22 DIAGNOSIS — M9902 Segmental and somatic dysfunction of thoracic region: Secondary | ICD-10-CM | POA: Diagnosis not present

## 2021-01-22 DIAGNOSIS — M9901 Segmental and somatic dysfunction of cervical region: Secondary | ICD-10-CM | POA: Diagnosis not present

## 2021-01-22 DIAGNOSIS — M5412 Radiculopathy, cervical region: Secondary | ICD-10-CM | POA: Diagnosis not present

## 2021-01-25 DIAGNOSIS — M9902 Segmental and somatic dysfunction of thoracic region: Secondary | ICD-10-CM | POA: Diagnosis not present

## 2021-01-25 DIAGNOSIS — M5412 Radiculopathy, cervical region: Secondary | ICD-10-CM | POA: Diagnosis not present

## 2021-01-25 DIAGNOSIS — M546 Pain in thoracic spine: Secondary | ICD-10-CM | POA: Diagnosis not present

## 2021-01-25 DIAGNOSIS — M9901 Segmental and somatic dysfunction of cervical region: Secondary | ICD-10-CM | POA: Diagnosis not present

## 2021-01-29 DIAGNOSIS — M9901 Segmental and somatic dysfunction of cervical region: Secondary | ICD-10-CM | POA: Diagnosis not present

## 2021-01-29 DIAGNOSIS — M9902 Segmental and somatic dysfunction of thoracic region: Secondary | ICD-10-CM | POA: Diagnosis not present

## 2021-01-29 DIAGNOSIS — M5412 Radiculopathy, cervical region: Secondary | ICD-10-CM | POA: Diagnosis not present

## 2021-01-29 DIAGNOSIS — M546 Pain in thoracic spine: Secondary | ICD-10-CM | POA: Diagnosis not present

## 2021-02-01 ENCOUNTER — Ambulatory Visit (INDEPENDENT_AMBULATORY_CARE_PROVIDER_SITE_OTHER): Payer: BC Managed Care – PPO

## 2021-02-01 DIAGNOSIS — J309 Allergic rhinitis, unspecified: Secondary | ICD-10-CM | POA: Diagnosis not present

## 2021-02-01 DIAGNOSIS — M5412 Radiculopathy, cervical region: Secondary | ICD-10-CM | POA: Diagnosis not present

## 2021-02-01 DIAGNOSIS — M9902 Segmental and somatic dysfunction of thoracic region: Secondary | ICD-10-CM | POA: Diagnosis not present

## 2021-02-01 DIAGNOSIS — M546 Pain in thoracic spine: Secondary | ICD-10-CM | POA: Diagnosis not present

## 2021-02-01 DIAGNOSIS — M9901 Segmental and somatic dysfunction of cervical region: Secondary | ICD-10-CM | POA: Diagnosis not present

## 2021-02-05 DIAGNOSIS — M9902 Segmental and somatic dysfunction of thoracic region: Secondary | ICD-10-CM | POA: Diagnosis not present

## 2021-02-05 DIAGNOSIS — M546 Pain in thoracic spine: Secondary | ICD-10-CM | POA: Diagnosis not present

## 2021-02-05 DIAGNOSIS — M9901 Segmental and somatic dysfunction of cervical region: Secondary | ICD-10-CM | POA: Diagnosis not present

## 2021-02-05 DIAGNOSIS — M5412 Radiculopathy, cervical region: Secondary | ICD-10-CM | POA: Diagnosis not present

## 2021-02-06 ENCOUNTER — Other Ambulatory Visit: Payer: Self-pay | Admitting: Student

## 2021-02-13 ENCOUNTER — Ambulatory Visit (INDEPENDENT_AMBULATORY_CARE_PROVIDER_SITE_OTHER): Payer: BC Managed Care – PPO

## 2021-02-13 ENCOUNTER — Telehealth: Payer: Self-pay | Admitting: Student

## 2021-02-13 DIAGNOSIS — J309 Allergic rhinitis, unspecified: Secondary | ICD-10-CM

## 2021-02-13 MED ORDER — METOPROLOL TARTRATE 25 MG PO TABS: 12.5000 mg | ORAL_TABLET | Freq: Two times a day (BID) | ORAL | 0 refills | Status: DC

## 2021-02-13 NOTE — Telephone Encounter (Signed)
Completed.

## 2021-02-13 NOTE — Telephone Encounter (Signed)
*  STAT* If patient is at the pharmacy, call can be transferred to refill team.   1. Which medications need to be refilled? (please list name of each medication and dose if known)  metoprolol tartrate (LOPRESSOR) 25 MG tablet  2. Which pharmacy/location (including street and city if local pharmacy) is medication to be sent to? [facility] - [place], [place] - [address] [facility] Portal to Registered [facility]  3. Do they need a 30 day or 90 day supply?   Patient states she is almost out of medication and would like enough to last her until her appointment on 4/14 with [name], PA.

## 2021-02-15 DIAGNOSIS — Z13228 Encounter for screening for other metabolic disorders: Secondary | ICD-10-CM | POA: Diagnosis not present

## 2021-02-15 DIAGNOSIS — Z0000 Encounter for general adult medical examination without abnormal findings: Secondary | ICD-10-CM | POA: Diagnosis not present

## 2021-02-15 DIAGNOSIS — Z13220 Encounter for screening for lipoid disorders: Secondary | ICD-10-CM | POA: Diagnosis not present

## 2021-02-15 DIAGNOSIS — Z131 Encounter for screening for diabetes mellitus: Secondary | ICD-10-CM | POA: Diagnosis not present

## 2021-02-20 ENCOUNTER — Ambulatory Visit (INDEPENDENT_AMBULATORY_CARE_PROVIDER_SITE_OTHER): Payer: BC Managed Care – PPO | Admitting: *Deleted

## 2021-02-20 DIAGNOSIS — J309 Allergic rhinitis, unspecified: Secondary | ICD-10-CM | POA: Diagnosis not present

## 2021-02-21 DIAGNOSIS — M5030 Other cervical disc degeneration, unspecified cervical region: Secondary | ICD-10-CM | POA: Diagnosis not present

## 2021-02-21 DIAGNOSIS — M47816 Spondylosis without myelopathy or radiculopathy, lumbar region: Secondary | ICD-10-CM | POA: Diagnosis not present

## 2021-02-21 DIAGNOSIS — G4486 Cervicogenic headache: Secondary | ICD-10-CM | POA: Diagnosis not present

## 2021-02-27 ENCOUNTER — Ambulatory Visit (INDEPENDENT_AMBULATORY_CARE_PROVIDER_SITE_OTHER): Payer: BC Managed Care – PPO

## 2021-02-27 DIAGNOSIS — J309 Allergic rhinitis, unspecified: Secondary | ICD-10-CM | POA: Diagnosis not present

## 2021-03-01 ENCOUNTER — Other Ambulatory Visit: Payer: Self-pay

## 2021-03-01 ENCOUNTER — Ambulatory Visit: Payer: BC Managed Care – PPO | Admitting: Student

## 2021-03-01 ENCOUNTER — Encounter: Payer: Self-pay | Admitting: Student

## 2021-03-01 VITALS — BP 108/58 | HR 58 | Ht 67.0 in | Wt 155.0 lb

## 2021-03-01 DIAGNOSIS — R002 Palpitations: Secondary | ICD-10-CM | POA: Diagnosis not present

## 2021-03-01 DIAGNOSIS — I341 Nonrheumatic mitral (valve) prolapse: Secondary | ICD-10-CM | POA: Diagnosis not present

## 2021-03-01 MED ORDER — METOPROLOL TARTRATE 25 MG PO TABS: 12.5000 mg | ORAL_TABLET | Freq: Two times a day (BID) | ORAL | 3 refills | Status: DC

## 2021-03-01 NOTE — Progress Notes (Signed)
Cardiology Office Note    Date:  03/01/2021   ID:  Mariah Burnett, DOB October 03, 1975, MRN [id number]  PCP:  Alanson Puls, [facility]  Cardiologist: Previously Dr. Bronson Ing --> Needs to switch to new MD  Chief Complaint  Patient presents with   Follow-up    Routine Visit    History of Present Illness:    Mariah Burnett is a 46 y.o. female with past medical history of palpitations, asthma and prior history of MVP (only trace MR by follow-up imaging in 2009 and 2012) who presents to the office today for overdue follow-up.   She was last examined by myself in 07/2019 and reported occasional palpitations but they would spontaneously resolve within a few minutes. She denied any associated exertional chest pain or dyspnea on exertion. Was continued on Lopressor 12.5 mg twice daily and informed to take an extra half tablet if needed for persistent palpitations.  In talking with the patient today, she reports overall doing well from a cardiac perspective since her last office visit. She does experience occasional palpitations if she is suffering from a cold but reports symptoms have overall been well controlled. She has been trying to walk more for exercise and is now up to walking for 40 minutes a day and does report some dyspnea with this but no limiting symptoms. 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 No recent orthopnea, PND or pitting edema.  Past Medical History:  Diagnosis Date   Arthritis    Asthma    MVP (mitral valve prolapse)    Although no evidence per echo in 2009. Trace MR   Palpitation    Scoliosis    Urticaria     Past Surgical History:  Procedure Laterality Date   KIDNEY STONE SURGERY Bilateral 2000   WISDOM TOOTH EXTRACTION Bilateral 1991    Current Medications: Outpatient Medications Prior to Visit  Medication Sig Dispense Refill   Azelastine HCl 137 MCG/SPRAY SOLN Place 2 sprays into the nose 2 (two) times  daily as needed. 90 mL 4   baclofen (LIORESAL) 10 MG tablet 10 mg 3 (three) times daily as needed.     levalbuterol (XOPENEX HFA) 45 MCG/ACT inhaler Inhale 2 puffs into the lungs every 6 (six) hours as needed for wheezing. 15 g 1   levocetirizine (XYZAL) 5 MG tablet TAKE 1 TABLET BY MOUTH EVERY DAY IN THE EVENING 30 tablet 11   meclizine (ANTIVERT) 12.5 MG tablet Take 1-2 tablets (12.5-25 mg total) by mouth 3 (three) times daily as needed for dizziness. 30 tablet 0   meloxicam (MOBIC) 15 MG tablet Take 15 mg by mouth daily as needed.     mometasone (ASMANEX, 120 METERED DOSES,) 220 MCG/ACT inhaler Inhale 2 puffs into the lungs daily. 3 each 1   Multiple Vitamin (MULTIVITAMIN) tablet Take 1 tablet by mouth daily.     metoprolol tartrate (LOPRESSOR) 25 MG tablet Take 0.5 tablets (12.5 mg total) by mouth 2 (two) times daily. 90 tablet 0   cholecalciferol (VITAMIN D3) 25 MCG (1000 UT) tablet Take 1,000 Units by mouth daily. (Patient not taking: Reported on 03/01/2021)     No facility-administered medications prior to visit.     Allergies:   Latex   Social History   Socioeconomic History   Marital status: Legally Separated    Spouse name: Not on file   Number of children: Not on file   Years of education: Not on file  Highest education level: Not on file  Occupational History   Not on file  Tobacco Use   Smoking status: Never   Smokeless tobacco: Never  Vaping Use   Vaping Use: Never used  Substance and Sexual Activity   Alcohol use: Yes    Comment: occ   Drug use: No   Sexual activity: Yes    Birth control/protection: None  Other Topics Concern   Not on file  Social History Narrative   Not on file   Social Determinants of Health   Financial Resource Strain: Not on file  Food Insecurity: Not on file  Transportation Needs: Not on file  Physical Activity: Not on file  Stress: Not on file  Social Connections: Not on file     Family History:  The patient's family history  includes Breast cancer in her mother; Cancer in her father and paternal grandfather; Emphysema in her paternal grandfather; Heart attack in her mother; Heart disease in her mother; Urticaria in her mother.   Review of Systems:    Please see the history of present illness.     All other systems reviewed and are otherwise negative except as noted above.   Physical Exam:    VS:  BP (!) 108/58    Pulse (!) 58    Ht 5\' 7"  (1.702 m)    Wt 155 lb (70.3 kg)    SpO2 100%    BMI 24.28 kg/m    General: Pleasant female appearing in no acute distress. Head: Normocephalic, atraumatic. Neck: No carotid bruits. JVD not elevated.  Lungs: Respirations regular and unlabored, without wheezes or rales.  Heart: Regular rate and rhythm. No S3 or S4.  No murmur, no rubs, or gallops appreciated. Abdomen: Appears non-distended. No obvious abdominal masses. Msk:  Strength and tone appear normal for age. No obvious joint deformities or effusions. Extremities: No clubbing or cyanosis. No pitting edema.  Distal pedal pulses are 2+ bilaterally. Neuro: Alert and oriented X 3. Moves all extremities spontaneously. No focal deficits noted. Psych:  Responds to questions appropriately with a normal affect. Skin: No rashes or lesions noted  Wt Readings from Last 3 Encounters:  03/01/21 155 lb (70.3 kg)  01/14/21 152 lb (68.9 kg)  10/05/19 153 lb 6.4 oz (69.6 kg)     Studies/Labs Reviewed:   EKG:  EKG is ordered today. The ekg ordered today demonstrates sinus bradycardia, HR 58 with no acute ST changes when compared to prior tracings.   Recent Labs: No results found for requested labs within last 8760 hours.   Lipid Panel    Component Value Date/Time   CHOL 179 04/27/2018 1512   TRIG 95 04/27/2018 1512   HDL 61 04/27/2018 1512   CHOLHDL 2.9 04/27/2018 1512   CHOLHDL 3 09/25/2010 0837   VLDL 25.8 09/25/2010 0837   LDLCALC 99 04/27/2018 1512    Additional studies/ records that were reviewed today include:    ETT: 08/2015 Blood pressure demonstrated a normal response to exercise. There was no ST segment deviation noted during stress. Negative exerise stress test for ischemia. Duke treadmill score of 9 consistent with low risk for major cardiac events. Excellent exercise capacity (120% of predicted based on age and gender)  Event Monitor: 07/2017 Sinus rhythm and sinus tachycardia seen. Maximum heart rate 123 bpm. No significant arrhythmias.  Assessment:    1. Heart palpitations   2. MVP (mitral valve prolapse)      Plan:   In order of problems listed above:  1. Palpitations - She had no significant arrhythmias by prior monitor in 07/2017. Reports occasional palpitations but no persistent symptoms. Continue Lopressor 12.5 mg twice daily as she was previously intolerant to Toprol-XL. Will request a copy of most recent labs from her PCP.   2. MVP - She has a history of MVP and most recent echo in 2012 showed only trace MR. She does not have a significant murmur by examination today. Given the timeframe since her last echocardiogram, will plan to obtain repeat imaging for evaluation.   Medication Adjustments/Labs and Tests Ordered: Current medicines are reviewed at length with the patient today.  Concerns regarding medicines are outlined above.  Medication changes, Labs and Tests ordered today are listed in the Patient Instructions below. Patient Instructions  Medication Instructions:   Continue current regimen.   Testing/Procedures:  Echocardiogram   Follow-Up: At [facility], you and your health needs are our priority.  As part of our continuing mission to provide you with exceptional heart care, we have created designated Provider Care Teams.  These Care Teams include your primary Cardiologist (physician) and Advanced Practice Providers (APPs -  Physician Assistants and Nurse Practitioners) who all work together to provide you with the care you need, when you need it.  We  recommend signing up for the patient portal called "MyChart".  Sign up information is provided on this After Visit Summary.  MyChart is used to connect with patients for Virtual Visits (Telemedicine).  Patients are able to view lab/test results, encounter notes, upcoming appointments, etc.  Non-urgent messages can be sent to your provider as well.   To learn more about what you can do with MyChart, go to [email or web address].    Your next appointment:   1 year(s)  The format for your next appointment:   In Person  Provider:   You will see one of the following Advanced Practice Providers on your designated Care Team:   Bernerd Pho, PA-C  Ermalinda Barrios, PA-C       Signed, Erma Heritage, [place]  03/01/2021 1:39 PM    [facility] [address] [place], [place] [address] Phone: [phone] Fax: [phone]

## 2021-03-01 NOTE — Patient Instructions (Signed)
Medication Instructions:   Continue current regimen.   Testing/Procedures:  Echocardiogram   Follow-Up: At [facility], you and your health needs are our priority.  As part of our continuing mission to provide you with exceptional heart care, we have created designated Provider Care Teams.  These Care Teams include your primary Cardiologist (physician) and Advanced Practice Providers (APPs -  Physician Assistants and Nurse Practitioners) who all work together to provide you with the care you need, when you need it.  We recommend signing up for the patient portal called "MyChart".  Sign up information is provided on this After Visit Summary.  MyChart is used to connect with patients for Virtual Visits (Telemedicine).  Patients are able to view lab/test results, encounter notes, upcoming appointments, etc.  Non-urgent messages can be sent to your provider as well.   To learn more about what you can do with MyChart, go to [email or web address].    Your next appointment:   1 year(s)  The format for your next appointment:   In Person  Provider:   You will see one of the following Advanced Practice Providers on your designated Care Team:   [place], PA-C  Jacolyn Reedy, [place]

## 2021-03-05 ENCOUNTER — Other Ambulatory Visit (HOSPITAL_COMMUNITY)
Admission: RE | Admit: 2021-03-05 | Discharge: 2021-03-05 | Disposition: A | Discharge status: Home / Self Care | Payer: BC Managed Care – PPO | Source: Ambulatory Visit | Attending: Obstetrics & Gynecology | Admitting: Obstetrics & Gynecology

## 2021-03-05 ENCOUNTER — Ambulatory Visit (INDEPENDENT_AMBULATORY_CARE_PROVIDER_SITE_OTHER): Payer: BC Managed Care – PPO | Admitting: Obstetrics & Gynecology

## 2021-03-05 ENCOUNTER — Encounter: Payer: Self-pay | Admitting: Obstetrics & Gynecology

## 2021-03-05 ENCOUNTER — Other Ambulatory Visit: Payer: Self-pay

## 2021-03-05 VITALS — BP 117/79 | HR 63 | Ht 67.0 in | Wt 152.0 lb

## 2021-03-05 DIAGNOSIS — Z01419 Encounter for gynecological examination (general) (routine) without abnormal findings: Secondary | ICD-10-CM | POA: Insufficient documentation

## 2021-03-05 NOTE — Addendum Note (Signed)
Addended by: Moss Mc on: 03/05/2021 04:52 PM   Modules accepted: Orders

## 2021-03-05 NOTE — Progress Notes (Signed)
Subjective:     Mariah Burnett is a 46 y.o. female here for a routine exam.  Patient's last menstrual period was 02/13/2021. G0P0000 Birth Control Method:  none Menstrual Calendar(currently): pretty regular, but getting a little different and lighter  Current complaints: none.   Current acute medical issues:     Recent Gynecologic History Patient's last menstrual period was 02/13/2021. Last Pap: 2020,  normal Last mammogram: 6/22  normal  Past Medical History:  Diagnosis Date   Arthritis    Asthma    MVP (mitral valve prolapse)    Although no evidence per echo in 2009. Trace MR   Palpitation    Scoliosis    Urticaria     Past Surgical History:  Procedure Laterality Date   KIDNEY STONE SURGERY Bilateral 2000   WISDOM TOOTH EXTRACTION Bilateral 1991    OB History     Gravida  0   Para  0   Term  0   Preterm  0   AB  0   Living  0      SAB  0   IAB  0   Ectopic  0   Multiple  0   Live Births  0           Social History   Socioeconomic History   Marital status: Legally Separated    Spouse name: Not on file   Number of children: Not on file   Years of education: Not on file   Highest education level: Not on file  Occupational History   Not on file  Tobacco Use   Smoking status: Never   Smokeless tobacco: Never  Vaping Use   Vaping Use: Never used  Substance and Sexual Activity   Alcohol use: Yes    Comment: occ   Drug use: No   Sexual activity: Yes    Birth control/protection: None  Other Topics Concern   Not on file  Social History Narrative   Not on file   Social Determinants of Health   Financial Resource Strain: Low Risk    Difficulty of Paying Living Expenses: Not very hard  Food Insecurity: Food Insecurity Present   Worried About Running Out of Food in the Last Year: Sometimes true   Ran Out of Food in the Last Year: Sometimes true  Transportation Needs: No Transportation Needs   Lack of Transportation (Medical): No    Lack of Transportation (Non-Medical): No  Physical Activity: Sufficiently Active   Days of Exercise per Week: 7 days   Minutes of Exercise per Session: 60 min  Stress: No Stress Concern Present   Feeling of Stress : Not at all  Social Connections: Moderately Isolated   Frequency of Communication with Friends and Family: Once a week   Frequency of Social Gatherings with Friends and Family: Once a week   Attends Religious Services: More than 4 times per year   Active Member of [employer] or Organizations: Yes   Attends Music therapist: More than 4 times per year   Marital Status: Separated    Family History  Problem Relation Age of Onset   Heart disease Mother    Heart attack Mother    Breast cancer Mother    Urticaria Mother    Cancer Father    Cancer Paternal Grandfather    Emphysema Paternal Grandfather      Current Outpatient Medications:    acyclovir (ZOVIRAX) 200 MG capsule, Take 400 mg by mouth 3 (three) times daily.,  Disp: , Rfl:    Azelastine HCl 137 MCG/SPRAY SOLN, Place 2 sprays into the nose 2 (two) times daily as needed., Disp: 90 mL, Rfl: 4   baclofen (LIORESAL) 10 MG tablet, 10 mg 3 (three) times daily as needed., Disp: , Rfl:    hydrOXYzine (ATARAX) 25 MG tablet, Take 25 mg by mouth every 8 (eight) hours as needed., Disp: , Rfl:    levalbuterol (XOPENEX HFA) 45 MCG/ACT inhaler, Inhale 2 puffs into the lungs every 6 (six) hours as needed for wheezing., Disp: 15 g, Rfl: 1   levocetirizine (XYZAL) 5 MG tablet, TAKE 1 TABLET BY MOUTH EVERY DAY IN THE EVENING, Disp: 30 tablet, Rfl: 11   meclizine (ANTIVERT) 12.5 MG tablet, Take 1-2 tablets (12.5-25 mg total) by mouth 3 (three) times daily as needed for dizziness., Disp: 30 tablet, Rfl: 0   meloxicam (MOBIC) 15 MG tablet, Take 15 mg by mouth daily as needed., Disp: , Rfl:    metoprolol tartrate (LOPRESSOR) 25 MG tablet, Take 0.5 tablets (12.5 mg total) by mouth 2 (two) times daily., Disp: 90 tablet, Rfl: 3    mometasone (ASMANEX, 120 METERED DOSES,) 220 MCG/ACT inhaler, Inhale 2 puffs into the lungs daily., Disp: 3 each, Rfl: 1   Multiple Vitamin (MULTIVITAMIN) tablet, Take 1 tablet by mouth daily., Disp: , Rfl:    cholecalciferol (VITAMIN D3) 25 MCG (1000 UT) tablet, Take 1,000 Units by mouth daily. (Patient not taking: Reported on 03/05/2021), Disp: , Rfl:   Review of Systems  Review of Systems  Constitutional: Negative for fever, chills, weight loss, malaise/fatigue and diaphoresis.  HENT: Negative for hearing loss, ear pain, nosebleeds, congestion, sore throat, neck pain, tinnitus and ear discharge.   Eyes: Negative for blurred vision, double vision, photophobia, pain, discharge and redness.  Respiratory: Negative for cough, hemoptysis, sputum production, shortness of breath, wheezing and stridor.   Cardiovascular: Negative for chest pain, palpitations, orthopnea, claudication, leg swelling and PND.  Gastrointestinal: negative for abdominal pain. Negative for heartburn, nausea, vomiting, diarrhea, constipation, blood in stool and melena.  Genitourinary: Negative for dysuria, urgency, frequency, hematuria and flank pain.  Musculoskeletal: Negative for myalgias, back pain, joint pain and falls.  Skin: Negative for itching and rash.  Neurological: Negative for dizziness, tingling, tremors, sensory change, speech change, focal weakness, seizures, loss of consciousness, weakness and headaches.  Endo/Heme/Allergies: Negative for environmental allergies and polydipsia. Does not bruise/bleed easily.  Psychiatric/Behavioral: Negative for depression, suicidal ideas, hallucinations, memory loss and substance abuse. The patient is not nervous/anxious and does not have insomnia.        Objective:  Blood pressure 117/79, pulse 63, height 5\' 7"  (1.702 m), weight 152 lb (68.9 kg), last menstrual period 02/13/2021.   Physical Exam  Vitals reviewed. Constitutional: She is oriented to person, place, and time.  She appears well-developed and well-nourished.  HENT:  Head: Normocephalic and atraumatic.        Right Ear: External ear normal.  Left Ear: External ear normal.  Nose: Nose normal.  Mouth/Throat: Oropharynx is clear and moist.  Eyes: Conjunctivae and EOM are normal. Pupils are equal, round, and reactive to light. Right eye exhibits no discharge. Left eye exhibits no discharge. No scleral icterus.  Neck: Normal range of motion. Neck supple. No tracheal deviation present. No thyromegaly present.  Cardiovascular: Normal rate, regular rhythm, normal heart sounds and intact distal pulses.  Exam reveals no gallop and no friction rub.   No murmur heard. Respiratory: Effort normal and breath sounds normal. No  respiratory distress. She has no wheezes. She has no rales. She exhibits no tenderness.  GI: Soft. Bowel sounds are normal. She exhibits no distension and no mass. There is no tenderness. There is no rebound and no guarding.  Genitourinary:  Breasts no masses skin changes or nipple changes bilaterally      Vulva is normal without lesions Vagina is pink moist without discharge Cervix normal in appearance and pap is done Uterus is normal size shape and contour Adnexa is negative with normal sized ovaries   Musculoskeletal: Normal range of motion. She exhibits no edema and no tenderness.  Neurological: She is alert and oriented to person, place, and time. She has normal reflexes. She displays normal reflexes. No cranial nerve deficit. She exhibits normal muscle tone. Coordination normal.  Skin: Skin is warm and dry. No rash noted. No erythema. No pallor.  Psychiatric: She has a normal mood and affect. Her behavior is normal. Judgment and thought content normal.       Medications Ordered at today's visit: No orders of the defined types were placed in this encounter.   Other orders placed at today's visit: No orders of the defined types were placed in this encounter.     Assessment:     Normal Gyn exam.    Plan:    Contraception: none. Follow up in: 3 years.     No follow-ups on file.

## 2021-03-11 LAB — CYTOLOGY - PAP
Comment: NEGATIVE
High risk HPV: NEGATIVE

## 2021-03-13 ENCOUNTER — Ambulatory Visit (INDEPENDENT_AMBULATORY_CARE_PROVIDER_SITE_OTHER): Payer: BC Managed Care – PPO

## 2021-03-13 DIAGNOSIS — J309 Allergic rhinitis, unspecified: Secondary | ICD-10-CM | POA: Diagnosis not present

## 2021-03-14 DIAGNOSIS — Z131 Encounter for screening for diabetes mellitus: Secondary | ICD-10-CM | POA: Diagnosis not present

## 2021-03-14 DIAGNOSIS — Z13228 Encounter for screening for other metabolic disorders: Secondary | ICD-10-CM | POA: Diagnosis not present

## 2021-03-14 DIAGNOSIS — Z13220 Encounter for screening for lipoid disorders: Secondary | ICD-10-CM | POA: Diagnosis not present

## 2021-03-14 DIAGNOSIS — R5382 Chronic fatigue, unspecified: Secondary | ICD-10-CM | POA: Diagnosis not present

## 2021-03-20 ENCOUNTER — Ambulatory Visit (INDEPENDENT_AMBULATORY_CARE_PROVIDER_SITE_OTHER): Payer: BC Managed Care – PPO | Admitting: *Deleted

## 2021-03-20 DIAGNOSIS — E559 Vitamin D deficiency, unspecified: Secondary | ICD-10-CM | POA: Diagnosis not present

## 2021-03-20 DIAGNOSIS — F419 Anxiety disorder, unspecified: Secondary | ICD-10-CM | POA: Diagnosis not present

## 2021-03-20 DIAGNOSIS — J309 Allergic rhinitis, unspecified: Secondary | ICD-10-CM | POA: Diagnosis not present

## 2021-03-20 DIAGNOSIS — Z13220 Encounter for screening for lipoid disorders: Secondary | ICD-10-CM | POA: Diagnosis not present

## 2021-03-20 DIAGNOSIS — Z13228 Encounter for screening for other metabolic disorders: Secondary | ICD-10-CM | POA: Diagnosis not present

## 2021-03-27 ENCOUNTER — Ambulatory Visit (HOSPITAL_COMMUNITY): Payer: BC Managed Care – PPO

## 2021-03-28 DIAGNOSIS — G4486 Cervicogenic headache: Secondary | ICD-10-CM | POA: Diagnosis not present

## 2021-03-28 DIAGNOSIS — M47816 Spondylosis without myelopathy or radiculopathy, lumbar region: Secondary | ICD-10-CM | POA: Diagnosis not present

## 2021-04-04 DIAGNOSIS — L570 Actinic keratosis: Secondary | ICD-10-CM | POA: Diagnosis not present

## 2021-04-04 DIAGNOSIS — L821 Other seborrheic keratosis: Secondary | ICD-10-CM | POA: Diagnosis not present

## 2021-04-04 DIAGNOSIS — L819 Disorder of pigmentation, unspecified: Secondary | ICD-10-CM | POA: Diagnosis not present

## 2021-04-04 DIAGNOSIS — Z85828 Personal history of other malignant neoplasm of skin: Secondary | ICD-10-CM | POA: Diagnosis not present

## 2021-04-17 ENCOUNTER — Ambulatory Visit (INDEPENDENT_AMBULATORY_CARE_PROVIDER_SITE_OTHER): Payer: BC Managed Care – PPO

## 2021-04-17 DIAGNOSIS — J309 Allergic rhinitis, unspecified: Secondary | ICD-10-CM

## 2021-04-19 ENCOUNTER — Ambulatory Visit: Payer: BC Managed Care – PPO | Admitting: Student

## 2021-05-15 ENCOUNTER — Ambulatory Visit (INDEPENDENT_AMBULATORY_CARE_PROVIDER_SITE_OTHER): Payer: BC Managed Care – PPO

## 2021-05-15 DIAGNOSIS — J309 Allergic rhinitis, unspecified: Secondary | ICD-10-CM | POA: Diagnosis not present

## 2021-05-27 DIAGNOSIS — R059 Cough, unspecified: Secondary | ICD-10-CM | POA: Diagnosis not present

## 2021-05-27 DIAGNOSIS — R0600 Dyspnea, unspecified: Secondary | ICD-10-CM | POA: Diagnosis not present

## 2021-05-27 DIAGNOSIS — R0602 Shortness of breath: Secondary | ICD-10-CM | POA: Diagnosis not present

## 2021-05-27 DIAGNOSIS — Z1152 Encounter for screening for COVID-19: Secondary | ICD-10-CM | POA: Diagnosis not present

## 2021-06-05 DIAGNOSIS — J309 Allergic rhinitis, unspecified: Secondary | ICD-10-CM | POA: Diagnosis not present

## 2021-06-05 DIAGNOSIS — I341 Nonrheumatic mitral (valve) prolapse: Secondary | ICD-10-CM | POA: Diagnosis not present

## 2021-06-05 DIAGNOSIS — H6093 Unspecified otitis externa, bilateral: Secondary | ICD-10-CM | POA: Diagnosis not present

## 2021-06-07 ENCOUNTER — Ambulatory Visit (INDEPENDENT_AMBULATORY_CARE_PROVIDER_SITE_OTHER): Payer: BC Managed Care – PPO

## 2021-06-07 DIAGNOSIS — J309 Allergic rhinitis, unspecified: Secondary | ICD-10-CM | POA: Diagnosis not present

## 2021-06-20 DIAGNOSIS — Z13228 Encounter for screening for other metabolic disorders: Secondary | ICD-10-CM | POA: Diagnosis not present

## 2021-06-20 DIAGNOSIS — Z130 Encounter for screening for diseases of the blood and blood-forming organs and certain disorders involving the immune mechanism: Secondary | ICD-10-CM | POA: Diagnosis not present

## 2021-06-20 DIAGNOSIS — Z131 Encounter for screening for diabetes mellitus: Secondary | ICD-10-CM | POA: Diagnosis not present

## 2021-06-20 DIAGNOSIS — E559 Vitamin D deficiency, unspecified: Secondary | ICD-10-CM | POA: Diagnosis not present

## 2021-06-24 DIAGNOSIS — J3089 Other allergic rhinitis: Secondary | ICD-10-CM | POA: Diagnosis not present

## 2021-06-24 NOTE — Progress Notes (Unsigned)
VIALS EXP 06-25-22 

## 2021-06-25 DIAGNOSIS — Z79899 Other long term (current) drug therapy: Secondary | ICD-10-CM | POA: Diagnosis not present

## 2021-06-25 DIAGNOSIS — M419 Scoliosis, unspecified: Secondary | ICD-10-CM | POA: Diagnosis not present

## 2021-06-25 DIAGNOSIS — I341 Nonrheumatic mitral (valve) prolapse: Secondary | ICD-10-CM | POA: Diagnosis not present

## 2021-06-25 DIAGNOSIS — E785 Hyperlipidemia, unspecified: Secondary | ICD-10-CM | POA: Diagnosis not present

## 2021-07-05 ENCOUNTER — Ambulatory Visit (INDEPENDENT_AMBULATORY_CARE_PROVIDER_SITE_OTHER): Payer: BC Managed Care – PPO

## 2021-07-05 DIAGNOSIS — J309 Allergic rhinitis, unspecified: Secondary | ICD-10-CM | POA: Diagnosis not present

## 2021-07-26 ENCOUNTER — Ambulatory Visit (INDEPENDENT_AMBULATORY_CARE_PROVIDER_SITE_OTHER): Payer: BC Managed Care – PPO

## 2021-07-26 DIAGNOSIS — J309 Allergic rhinitis, unspecified: Secondary | ICD-10-CM | POA: Diagnosis not present

## 2021-08-05 DIAGNOSIS — Z79899 Other long term (current) drug therapy: Secondary | ICD-10-CM | POA: Diagnosis not present

## 2021-08-05 DIAGNOSIS — E785 Hyperlipidemia, unspecified: Secondary | ICD-10-CM | POA: Diagnosis not present

## 2021-08-14 ENCOUNTER — Ambulatory Visit (INDEPENDENT_AMBULATORY_CARE_PROVIDER_SITE_OTHER): Payer: BC Managed Care – PPO

## 2021-08-14 DIAGNOSIS — J309 Allergic rhinitis, unspecified: Secondary | ICD-10-CM

## 2021-08-23 ENCOUNTER — Other Ambulatory Visit (HOSPITAL_COMMUNITY): Payer: Self-pay | Admitting: Obstetrics & Gynecology

## 2021-08-23 ENCOUNTER — Ambulatory Visit (INDEPENDENT_AMBULATORY_CARE_PROVIDER_SITE_OTHER): Payer: BC Managed Care – PPO | Admitting: *Deleted

## 2021-08-23 DIAGNOSIS — J309 Allergic rhinitis, unspecified: Secondary | ICD-10-CM | POA: Diagnosis not present

## 2021-08-23 DIAGNOSIS — Z1231 Encounter for screening mammogram for malignant neoplasm of breast: Secondary | ICD-10-CM

## 2021-09-04 ENCOUNTER — Ambulatory Visit (INDEPENDENT_AMBULATORY_CARE_PROVIDER_SITE_OTHER): Payer: BC Managed Care – PPO

## 2021-09-04 DIAGNOSIS — J309 Allergic rhinitis, unspecified: Secondary | ICD-10-CM

## 2021-09-11 ENCOUNTER — Ambulatory Visit (INDEPENDENT_AMBULATORY_CARE_PROVIDER_SITE_OTHER): Payer: BC Managed Care – PPO

## 2021-09-11 DIAGNOSIS — J309 Allergic rhinitis, unspecified: Secondary | ICD-10-CM

## 2021-09-16 ENCOUNTER — Ambulatory Visit (HOSPITAL_COMMUNITY)
Admission: RE | Admit: 2021-09-16 | Discharge: 2021-09-16 | Disposition: A | Discharge status: Home / Self Care | Payer: BC Managed Care – PPO | Source: Ambulatory Visit | Attending: Obstetrics & Gynecology | Admitting: Obstetrics & Gynecology

## 2021-09-16 DIAGNOSIS — Z1231 Encounter for screening mammogram for malignant neoplasm of breast: Secondary | ICD-10-CM | POA: Diagnosis not present

## 2021-09-20 ENCOUNTER — Ambulatory Visit (INDEPENDENT_AMBULATORY_CARE_PROVIDER_SITE_OTHER): Payer: BC Managed Care – PPO

## 2021-09-20 DIAGNOSIS — J309 Allergic rhinitis, unspecified: Secondary | ICD-10-CM

## 2021-10-16 ENCOUNTER — Ambulatory Visit (INDEPENDENT_AMBULATORY_CARE_PROVIDER_SITE_OTHER): Payer: BC Managed Care – PPO | Admitting: *Deleted

## 2021-10-16 DIAGNOSIS — J309 Allergic rhinitis, unspecified: Secondary | ICD-10-CM | POA: Diagnosis not present

## 2021-10-17 ENCOUNTER — Encounter: Payer: Self-pay | Admitting: Internal Medicine

## 2021-10-17 ENCOUNTER — Ambulatory Visit: Payer: BC Managed Care – PPO | Admitting: Internal Medicine

## 2021-10-17 VITALS — BP 119/76 | HR 70 | Ht 67.0 in | Wt 155.6 lb

## 2021-10-17 DIAGNOSIS — Z1211 Encounter for screening for malignant neoplasm of colon: Secondary | ICD-10-CM

## 2021-10-17 DIAGNOSIS — Z7689 Persons encountering health services in other specified circumstances: Secondary | ICD-10-CM

## 2021-10-17 DIAGNOSIS — Z1212 Encounter for screening for malignant neoplasm of rectum: Secondary | ICD-10-CM

## 2021-10-17 DIAGNOSIS — E782 Mixed hyperlipidemia: Secondary | ICD-10-CM

## 2021-10-17 DIAGNOSIS — J4520 Mild intermittent asthma, uncomplicated: Secondary | ICD-10-CM | POA: Diagnosis not present

## 2021-10-17 DIAGNOSIS — M7522 Bicipital tendinitis, left shoulder: Secondary | ICD-10-CM

## 2021-10-17 DIAGNOSIS — J3089 Other allergic rhinitis: Secondary | ICD-10-CM

## 2021-10-17 DIAGNOSIS — E785 Hyperlipidemia, unspecified: Secondary | ICD-10-CM | POA: Insufficient documentation

## 2021-10-17 DIAGNOSIS — I341 Nonrheumatic mitral (valve) prolapse: Secondary | ICD-10-CM | POA: Diagnosis not present

## 2021-10-17 DIAGNOSIS — J302 Other seasonal allergic rhinitis: Secondary | ICD-10-CM

## 2021-10-17 NOTE — Progress Notes (Signed)
New Patient Office Visit  Subjective    Patient ID: Mariah Burnett, female    DOB: 05/16/1975  Age: 46 y.o. MRN: [id number]  CC:  Chief Complaint  Patient presents with   Establish Care   HPI Mariah Burnett presents to establish care.  She is a 46 year old woman with a past medical history significant for mitral valve prolapse, scoliosis with chronic low back pain, allergies, asthma, and hyperlipidemia.  She was previously followed at the [facility] clinic.  Mariah Burnett acute concern today is left forearm pain.  She reports a history of pain in her left arm for the last 3-4 weeks.  Her pain began while she was doing a high intensity workout that involved drumsticks.  She has experienced pain on the anterior portion of her forearm.  Pain is exacerbated with flexion of the elbow and pronation.  She has difficulty lifting objects with her arm extended.  Mariah Burnett conservative treatment measures including ice, IcyHot creams, and as needed meloxicam.  Meloxicam seems to temporarily improve her pain but does not fully relieve her symptoms.  She is interested in further evaluation today and treatment recommendations.  Mariah Burnett asymptomatic and has no additional concerns.  Acute concerns, chronic medical conditions, and outstanding preventative healthcare maintenance items discussed today are individually addressed in A/P below.  Outpatient Encounter Medications as of 10/17/2021  Medication Sig   acyclovir (ZOVIRAX) 200 MG capsule Take 400 mg by mouth 3 (three) times daily.   Azelastine HCl 137 MCG/SPRAY SOLN Place 2 sprays into the nose 2 (two) times daily as needed.   hydrOXYzine (ATARAX) 25 MG tablet Take 25 mg by mouth every 8 (eight) hours as needed.   levalbuterol (XOPENEX HFA) 45 MCG/ACT inhaler Inhale 2 puffs into the lungs every 6 (six) hours as needed for wheezing.   levocetirizine (XYZAL) 5 MG tablet TAKE 1 TABLET BY MOUTH EVERY DAY IN THE EVENING   meclizine (ANTIVERT)  12.5 MG tablet Take 1-2 tablets (12.5-25 mg total) by mouth 3 (three) times daily as needed for dizziness.   meloxicam (MOBIC) 15 MG tablet Take 15 mg by mouth daily as needed.   metoprolol tartrate (LOPRESSOR) 25 MG tablet Take 0.5 tablets (12.5 mg total) by mouth 2 (two) times daily.   mometasone (ASMANEX, 120 METERED DOSES,) 220 MCG/ACT inhaler Inhale 2 puffs into the lungs daily.   Multiple Vitamin (MULTIVITAMIN) tablet Take 1 tablet by mouth daily.   [DISCONTINUED] baclofen (LIORESAL) 10 MG tablet 10 mg 3 (three) times daily as needed.   [DISCONTINUED] cholecalciferol (VITAMIN D3) 25 MCG (1000 UT) tablet Take 1,000 Units by mouth daily. (Patient not taking: Reported on 03/05/2021)   No facility-administered encounter medications on file as of 10/17/2021.   Past Medical History:  Diagnosis Date   Arthritis    Asthma    MVP (mitral valve prolapse)    Although no evidence per echo in 2009. Trace MR   Palpitation    Scoliosis    Urticaria    Past Surgical History:  Procedure Laterality Date   KIDNEY STONE SURGERY Bilateral 2000   WISDOM TOOTH EXTRACTION Bilateral 1991   Family History  Problem Relation Age of Onset   Heart disease Mother    Heart attack Mother    Breast cancer Mother    Urticaria Mother    Cancer Father    Cancer Paternal Grandfather    Emphysema Paternal Grandfather    Social History   Socioeconomic History   Marital  status: Legally Separated    Spouse name: Not on file   Number of children: Not on file   Years of education: Not on file   Highest education level: Not on file  Occupational History   Not on file  Tobacco Use   Smoking status: Never   Smokeless tobacco: Never  Vaping Use   Vaping Use: Never used  Substance and Sexual Activity   Alcohol use: Yes    Comment: occ   Drug use: No   Sexual activity: Yes    Birth control/protection: None  Other Topics Concern   Not on file  Social History Narrative   Not on file   Social  Determinants of Health   Financial Resource Strain: Low Risk  (03/05/2021)   Overall Financial Resource Strain (CARDIA)    Difficulty of Paying Living Expenses: Not very hard  Food Insecurity: Food Insecurity Present (03/05/2021)   Hunger Vital Sign    Worried About Running Out of Food in the Last Year: Sometimes true    Ran Out of Food in the Last Year: Sometimes true  Transportation Needs: No Transportation Needs (03/05/2021)   PRAPARE - Administrator, Civil Service (Medical): No    Lack of Transportation (Non-Medical): No  Physical Activity: Sufficiently Active (03/05/2021)   Exercise Vital Sign    Days of Exercise per Week: 7 days    Minutes of Exercise per Session: 60 min  Stress: No Stress Concern Present (03/05/2021)   [employer] of Occupational Health - Occupational Stress Questionnaire    Feeling of Stress : Not at all  Social Connections: Moderately Isolated (03/05/2021)   Social Connection and Isolation Panel [NHANES]    Frequency of Communication with Friends and Family: Once a week    Frequency of Social Gatherings with Friends and Family: Once a week    Attends Religious Services: More than 4 times per year    Active Member of [employer] or Organizations: Yes    Attends Banker Meetings: More than 4 times per year    Marital Status: Separated  Intimate Partner Violence: Not At Risk (03/05/2021)   Humiliation, Afraid, Rape, and Kick questionnaire    Fear of Current or Ex-Partner: No    Emotionally Abused: No    Physically Abused: No    Sexually Abused: No   Review of Systems  Constitutional:  Negative for chills and fever.  HENT:  Negative for sore throat.   Respiratory:  Negative for cough and shortness of breath.   Cardiovascular:  Negative for chest pain, palpitations and leg swelling.  Gastrointestinal:  Negative for abdominal pain, blood in stool, constipation, diarrhea, nausea and vomiting.  Genitourinary:  Negative for dysuria and  hematuria.  Musculoskeletal:        Left forearm pain  Skin:  Negative for itching and rash.  Neurological:  Negative for dizziness and headaches.  Psychiatric/Behavioral:  Negative for depression and suicidal ideas.     Objective    BP 119/76   Pulse 70   Ht 5\' 7"  (1.702 m)   Wt 155 lb 9.6 oz (70.6 kg)   SpO2 99%   BMI 24.37 kg/m   Physical Exam Constitutional:      General: She is not in acute distress.    Appearance: Normal appearance. She is not toxic-appearing.  HENT:     Head: Normocephalic and atraumatic.     Right Ear: External ear normal.     Left Ear: External ear normal.  Nose: Nose normal. No congestion or rhinorrhea.     Mouth/Throat:     Mouth: Mucous membranes are moist.     Pharynx: Oropharynx is clear. No oropharyngeal exudate or posterior oropharyngeal erythema.  Eyes:     General: No scleral icterus.    Extraocular Movements: Extraocular movements intact.     Conjunctiva/sclera: Conjunctivae normal.     Pupils: Pupils are equal, round, and reactive to light.  Cardiovascular:     Rate and Rhythm: Normal rate and regular rhythm.     Pulses: Normal pulses.     Heart sounds: Normal heart sounds. No murmur heard.    No friction rub. No gallop.  Pulmonary:     Effort: Pulmonary effort is normal.     Breath sounds: Normal breath sounds. No wheezing, rhonchi or rales.  Abdominal:     General: Abdomen is flat. Bowel sounds are normal. There is no distension.     Palpations: Abdomen is soft.     Tenderness: There is no abdominal tenderness.  Musculoskeletal:        General: Tenderness (Medial forearm) present.     Cervical back: Normal range of motion.     Comments: ROM of the left elbow and wrist is intact.  There is tenderness palpation along the antecubital surface just below the left elbow.  Pain is worse with resisted pronation and with extension at the elbow.  Negative speeds.  Intact flexion.  ROM of the left shoulder is intact.  There is no  tenderness palpation over the bicipital groove.  Negative Phalen's/Tinel's.  Lymphadenopathy:     Cervical: No cervical adenopathy.  Skin:    General: Skin is warm and dry.     Capillary Refill: Capillary refill takes less than 2 seconds.     Coloration: Skin is not jaundiced.  Neurological:     General: No focal deficit present.     Mental Status: She is alert and oriented to person, place, and time.  Psychiatric:        Mood and Affect: Mood normal.        Behavior: Behavior normal.    Assessment & Plan:   Problem List Items Addressed This Visit       MVP (mitral valve prolapse)    Followed by cardiology, last seen in February.  Currently prescribed Lopressor.  Asymptomatic today.      Mild intermittent asthma without complication    Prescribe Xopenex and Asmanex for management of asthma.  Asymptomatic today.  Unremarkable pulmonary exam.      Seasonal and perennial allergic rhinitis    Followed by allergy and immunology and receives weekly injections.  Asymptomatic today.        Biceps tendinitis, left    Her description of pain, location, and exam findings today suggest distal biceps tendinitis of the left arm.  We discussed treatment options.  I recommended that she take meloxicam daily for the next 2 weeks.  I have also provided her with home PT exercises for biceps tendinitis.  She will also return to care next week for follow-up with Dr. Barbaraann Faster for ultrasound evaluation of the left forearm.  If there is no significant improvement in her pain over the next month, plan to refer to sports medicine for further evaluation.      Hyperlipidemia    Recent labs performed at the [facility] clinic demonstrate hyperlipidemia.  Total cholesterol > 200 and LDL 140s.  I reviewed with Mariah Burnett that these numbers suggest her cholesterol  is poorly controlled.  She states that she was previously prescribed simvastatin but stopped taking the medication due to myalgias.  She is interested in  making significant lifestyle modifications to improve her diet and increase the frequency of exercise in an effort to improve her cholesterol.  We will repeat her lipid panel in 6 months.  She understands that if there is no improvement in her cholesterol and her 10-year ASCVD risk remains elevated, statin therapy will be indicated.      Encounter to establish care    Presenting today to establish care.  Recent medical records and available labs reviewed.  We have requested records from [facility] clinic. -Outstanding vaccines deferred today.  She will receive these at work -Cologuard ordered today       Return in about 1 week (around 10/24/2021) for L forearm [place].   Johnette Abraham, MD

## 2021-10-17 NOTE — Assessment & Plan Note (Signed)
Followed by allergy and immunology and receives weekly injections.  Asymptomatic today.

## 2021-10-17 NOTE — Patient Instructions (Signed)
It was a pleasure to see you today.  Thank you for giving [place] the opportunity to be involved in your care.  Below is a brief recap of your visit and next steps.  We will plan to see you again in 1 week.  Summary You have established care today I will review recent labs from the [facility] clinic. I am concerned for a strain of your biceps tendon. Please see the attached exercises and take meloxicam daily for the next two weeks. We will arrange an appointment with Dr. Court Joy next week for an ultrasound.

## 2021-10-17 NOTE — Assessment & Plan Note (Signed)
Her description of pain, location, and exam findings today suggest distal biceps tendinitis of the left arm.  We discussed treatment options.  I recommended that she take meloxicam daily for the next 2 weeks.  I have also provided her with home PT exercises for biceps tendinitis.  She will also return to care next week for follow-up with Dr. Court Joy for ultrasound evaluation of the left forearm.  If there is no significant improvement in her pain over the next month, plan to refer to sports medicine for further evaluation.

## 2021-10-17 NOTE — Assessment & Plan Note (Signed)
Followed by cardiology, last seen in February.  Currently prescribed Lopressor.  Asymptomatic today.

## 2021-10-17 NOTE — Assessment & Plan Note (Signed)
Presenting today to establish care.  Recent medical records and available labs reviewed.  We have requested records from [facility] clinic. -Outstanding vaccines deferred today.  She will receive these at work -[employer] ordered today

## 2021-10-17 NOTE — Assessment & Plan Note (Signed)
Recent labs performed at the [facility] clinic demonstrate hyperlipidemia.  Total cholesterol > 200 and LDL 140s.  I reviewed with Mariah Burnett that these numbers suggest her cholesterol is poorly controlled.  She states that she was previously prescribed simvastatin but stopped taking the medication due to myalgias.  She is interested in making significant lifestyle modifications to improve her diet and increase the frequency of exercise in an effort to improve her cholesterol.  We will repeat her lipid panel in 6 months.  She understands that if there is no improvement in her cholesterol and her 10-year ASCVD risk remains elevated, statin therapy will be indicated.

## 2021-10-17 NOTE — Assessment & Plan Note (Signed)
Prescribe Xopenex and Asmanex for management of asthma.  Asymptomatic today.  Unremarkable pulmonary exam.

## 2021-10-22 ENCOUNTER — Encounter: Payer: Self-pay | Admitting: Internal Medicine

## 2021-10-22 ENCOUNTER — Ambulatory Visit: Payer: BC Managed Care – PPO | Admitting: Internal Medicine

## 2021-10-22 DIAGNOSIS — M7522 Bicipital tendinitis, left shoulder: Secondary | ICD-10-CM | POA: Diagnosis not present

## 2021-10-22 NOTE — Patient Instructions (Signed)
Thank you for trusting me with your care. To recap, today we discussed the following:  Continue regimen  ice, tylenol for soreness if needed, physical therapy and finish course of Meloxicam. If not improving we can send you for an MRI to better evaluate.

## 2021-10-22 NOTE — Assessment & Plan Note (Signed)
Patient pain began suddenly at workout at [facility]. She had pain at elbow and some radiation to shoulder. Her arm has been weak. The weakness limits her use of left arm to hold a cup and get dressed. Since starting meloxicam she has times when the pain is improved, but seems to come back quickly. She is doing PT exercises prescribed by Dr.Dixon.   Assessment/Plan: Exam consistent with biceps tendinitis. Can not rule out partial tear with ultrasound today. Based on exam patient does not have a full thickness tear. She is going to continue conservative management and have a follow up visit with Dr.Dixon if not improving. Recommended ice , continued PT, tylenol PRN for tenderness , and finishing Meloxicam.

## 2021-10-22 NOTE — Progress Notes (Unsigned)
     CC: arm pain  HPI:[name] is a 46 y.o. female who presents for evaluation of distal arm pain. For the details of today's visit, please refer to the assessment and plan.   Past Medical History:  Diagnosis Date   Arthritis    Asthma    MVP (mitral valve prolapse)    Although no evidence per echo in 2009. Trace MR   Palpitation    Scoliosis    Urticaria     Review of Systems:    Review of Systems  Constitutional:  Negative for chills and fever.  Musculoskeletal:        +weakness in left arm - for other joint pain      Physical Exam: Vitals:   10/22/21 1622  BP: 115/79  Pulse: 67  Resp: 16  SpO2: 98%  Weight: 156 lb (70.8 kg)  Height: 5\' 7"  (1.702 m)   Left elbow No erythema or ecchymosis  No TTP at cubital fossa. No TTP at medial epicondyle or lateral epicondyle.  Negative bicep squeeze test, positive TILT test ( tender at radial tuberosity)   Assessment & Plan:   Biceps tendinitis, left Patient pain began suddenly at workout at [facility]. She had pain at elbow and some radiation to shoulder. Her arm has been weak. The weakness limits her use of left arm to hold a cup and get dressed. Since starting meloxicam she has times when the pain is improved, but seems to come back quickly. She is doing PT exercises prescribed by Dr.Dixon.   Assessment/Plan: Exam consistent with biceps tendinitis. Can not rule out partial tear with ultrasound today. Based on exam patient does not have a full thickness tear. She is going to continue conservative management and have a follow up visit with Dr.Dixon if not improving. Recommended ice , continued PT, tylenol PRN for tenderness , and finishing Meloxicam.    Lorene Dy, MD

## 2021-11-06 ENCOUNTER — Ambulatory Visit (INDEPENDENT_AMBULATORY_CARE_PROVIDER_SITE_OTHER): Payer: BC Managed Care – PPO | Admitting: *Deleted

## 2021-11-06 DIAGNOSIS — J309 Allergic rhinitis, unspecified: Secondary | ICD-10-CM | POA: Diagnosis not present

## 2021-11-08 ENCOUNTER — Telehealth: Payer: Self-pay | Admitting: Internal Medicine

## 2021-11-08 ENCOUNTER — Other Ambulatory Visit: Payer: Self-pay

## 2021-11-08 MED ORDER — MELOXICAM 15 MG PO TABS: 15.0000 mg | ORAL_TABLET | Freq: Every day | ORAL | 0 refills | Status: DC | PRN

## 2021-11-08 NOTE — Telephone Encounter (Signed)
Refilled medication

## 2021-11-08 NOTE — Telephone Encounter (Signed)
  Prescription Request   **patient was told to call when her rx ran out so that we could take over filling this medication** wants 90 day qty     11/08/2021  Is this a "Controlled Substance" medicine? No  LOV: 10/22/2021   What is the name of the medication or equipment? meloxicam (MOBIC) 15 MG tablet   Have you contacted your pharmacy to request a refill? No   Which pharmacy would you like this sent to?  [facility] [place], [place] [address] [place] [address] Phone: [phone] Fax: [place] (New Address) - [place], [place] AT Previously: [address] [place] [address] Phone: [phone] Fax: [phone]   Patient notified that their request is being sent to the clinical staff for review and that they should receive a response within 2 business days.   Please advise at [phone] (mobile)

## 2021-11-15 DIAGNOSIS — M9902 Segmental and somatic dysfunction of thoracic region: Secondary | ICD-10-CM | POA: Diagnosis not present

## 2021-11-15 DIAGNOSIS — M542 Cervicalgia: Secondary | ICD-10-CM | POA: Diagnosis not present

## 2021-11-15 DIAGNOSIS — M546 Pain in thoracic spine: Secondary | ICD-10-CM | POA: Diagnosis not present

## 2021-11-15 DIAGNOSIS — M9901 Segmental and somatic dysfunction of cervical region: Secondary | ICD-10-CM | POA: Diagnosis not present

## 2021-11-18 ENCOUNTER — Encounter: Payer: Self-pay | Admitting: Internal Medicine

## 2021-11-18 ENCOUNTER — Ambulatory Visit (INDEPENDENT_AMBULATORY_CARE_PROVIDER_SITE_OTHER): Payer: BC Managed Care – PPO | Admitting: Internal Medicine

## 2021-11-18 DIAGNOSIS — U071 COVID-19: Secondary | ICD-10-CM | POA: Diagnosis not present

## 2021-11-18 MED ORDER — GUAIFENESIN-DM 100-10 MG/5ML PO SYRP: 5.0000 mL | ORAL_SOLUTION | ORAL | 0 refills | Status: DC | PRN

## 2021-11-18 NOTE — Progress Notes (Signed)
   Acute Telephone Visit  Virtual Visit via Telephone Note  I connected with Lanell Carpenter on 11/18/21 at  9:00 AM EST by telephone and verified that I am speaking with the correct person using two identifiers.  Location: Patient: [address] [place], [place] [address] Provider: [address], [place] [address]   I discussed the limitations, risks, security and privacy concerns of performing an evaluation and management service by telephone and the availability of in person appointments. I also discussed with the patient that there may be a patient responsible charge related to this service. The patient expressed understanding and agreed to proceed.   History of Present Illness:  Ms. Chriswell is a 46 year old woman evaluated today via virtual encounter after testing positive for COVID-19 yesterday (11/12).  She reports onset of symptoms yesterday as well.  She describes headache, fever/chills with Tmax 101.71F, body aches, and a productive cough.  She is vaccinated against COVID-19.  Ms. 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  She is currently taking TheraFlu and Motrin for her symptoms.  Assessment and Plan:  COVID-19 Evaluated today via virtual encounter after testing positive for COVID-19 yesterday (11/12).  She is vaccinated against COVID-19 and does not have multiple medical comorbidities.  She has been taking TheraFlu and Motrin for symptom relief.  -No indication for specific COVID-19 outpatient therapy.  I recommended continued supportive care measures for now, including Tylenol, hydration and rest.  I have also prescribed guaifenesin-dextromethorphan cough syrup for cough relief.  I recommended that she isolate through day 5 since symptom onset.  On day 6 she can interact with others as long as she has been afebrile for more than 24 hours and is wearing a mask.  She expressed understanding. -We will schedule routine follow-up for  spring 2024.   Follow Up Instructions:    I discussed the assessment and treatment plan with the patient. The patient was provided an opportunity to ask questions and all were answered. The patient agreed with the plan and demonstrated an understanding of the instructions.   The patient was advised to call back or seek an in-person evaluation if the symptoms worsen or if the condition fails to improve as anticipated.  I provided 13 minutes of non-face-to-face time during this encounter.   Billie Lade, MD

## 2021-11-24 DIAGNOSIS — Z1211 Encounter for screening for malignant neoplasm of colon: Secondary | ICD-10-CM | POA: Diagnosis not present

## 2021-11-24 DIAGNOSIS — Z1212 Encounter for screening for malignant neoplasm of rectum: Secondary | ICD-10-CM | POA: Diagnosis not present

## 2021-11-27 DIAGNOSIS — M9902 Segmental and somatic dysfunction of thoracic region: Secondary | ICD-10-CM | POA: Diagnosis not present

## 2021-11-27 DIAGNOSIS — M9901 Segmental and somatic dysfunction of cervical region: Secondary | ICD-10-CM | POA: Diagnosis not present

## 2021-11-27 DIAGNOSIS — M542 Cervicalgia: Secondary | ICD-10-CM | POA: Diagnosis not present

## 2021-11-27 DIAGNOSIS — M546 Pain in thoracic spine: Secondary | ICD-10-CM | POA: Diagnosis not present

## 2021-12-04 ENCOUNTER — Ambulatory Visit (INDEPENDENT_AMBULATORY_CARE_PROVIDER_SITE_OTHER): Payer: BC Managed Care – PPO

## 2021-12-04 DIAGNOSIS — M542 Cervicalgia: Secondary | ICD-10-CM | POA: Diagnosis not present

## 2021-12-04 DIAGNOSIS — M9902 Segmental and somatic dysfunction of thoracic region: Secondary | ICD-10-CM | POA: Diagnosis not present

## 2021-12-04 DIAGNOSIS — J309 Allergic rhinitis, unspecified: Secondary | ICD-10-CM

## 2021-12-04 DIAGNOSIS — M9901 Segmental and somatic dysfunction of cervical region: Secondary | ICD-10-CM | POA: Diagnosis not present

## 2021-12-04 DIAGNOSIS — M546 Pain in thoracic spine: Secondary | ICD-10-CM | POA: Diagnosis not present

## 2021-12-04 LAB — COLOGUARD: COLOGUARD: NEGATIVE

## 2021-12-05 DIAGNOSIS — J3089 Other allergic rhinitis: Secondary | ICD-10-CM

## 2021-12-05 NOTE — Progress Notes (Addendum)
VIALS EXP 12-06-22 

## 2021-12-11 DIAGNOSIS — M9901 Segmental and somatic dysfunction of cervical region: Secondary | ICD-10-CM | POA: Diagnosis not present

## 2021-12-11 DIAGNOSIS — M546 Pain in thoracic spine: Secondary | ICD-10-CM | POA: Diagnosis not present

## 2021-12-11 DIAGNOSIS — M9902 Segmental and somatic dysfunction of thoracic region: Secondary | ICD-10-CM | POA: Diagnosis not present

## 2021-12-11 DIAGNOSIS — M542 Cervicalgia: Secondary | ICD-10-CM | POA: Diagnosis not present

## 2021-12-16 ENCOUNTER — Encounter: Payer: Self-pay | Admitting: Obstetrics & Gynecology

## 2021-12-17 ENCOUNTER — Encounter: Payer: Self-pay | Admitting: Obstetrics & Gynecology

## 2021-12-17 ENCOUNTER — Ambulatory Visit: Payer: BC Managed Care – PPO | Admitting: Obstetrics & Gynecology

## 2021-12-17 VITALS — BP 127/87 | HR 83 | Ht 67.0 in | Wt 157.0 lb

## 2021-12-17 DIAGNOSIS — N6321 Unspecified lump in the left breast, upper outer quadrant: Secondary | ICD-10-CM | POA: Diagnosis not present

## 2021-12-17 NOTE — Progress Notes (Signed)
Chief Complaint  Patient presents with   knot in left breast    Noticed it Sunday pm      46  y.o. [id number] Patient's last menstrual period was 12/10/2021 (approximate). The current method of family planning is none.  Outpatient Encounter Medications as of 12/17/2021  Medication Sig   acyclovir (ZOVIRAX) 200 MG capsule Take 400 mg by mouth 3 (three) times daily.   Azelastine HCl 137 MCG/SPRAY SOLN Place 2 sprays into the nose 2 (two) times daily as needed.   hydrOXYzine (ATARAX) 25 MG tablet Take 25 mg by mouth every 8 (eight) hours as needed.   levalbuterol (XOPENEX HFA) 45 MCG/ACT inhaler Inhale 2 puffs into the lungs every 6 (six) hours as needed for wheezing.   levocetirizine (XYZAL) 5 MG tablet TAKE 1 TABLET BY MOUTH EVERY DAY IN THE EVENING   meloxicam (MOBIC) 15 MG tablet Take 1 tablet (15 mg total) by mouth daily as needed. Take 15 mg by mouth daily as needed.   metoprolol tartrate (LOPRESSOR) 25 MG tablet Take 0.5 tablets (12.5 mg total) by mouth 2 (two) times daily.   mometasone (ASMANEX, 120 METERED DOSES,) 220 MCG/ACT inhaler Inhale 2 puffs into the lungs daily.   Multiple Vitamin (MULTIVITAMIN) tablet Take 1 tablet by mouth daily.   [DISCONTINUED] guaiFENesin-dextromethorphan (ROBITUSSIN DM) 100-10 MG/5ML syrup Take 5 mLs by mouth every 4 (four) hours as needed for cough.   [DISCONTINUED] meclizine (ANTIVERT) 12.5 MG tablet Take 1-2 tablets (12.5-25 mg total) by mouth 3 (three) times daily as needed for dizziness.   No facility-administered encounter medications on file as of 12/17/2021.    Subjective Pt  felt a tender area in the left breast 48 hours ago, has history of some tender cysts thru the years Caffeine associated in general and has had increase in intake of late  Past Medical History:  Diagnosis Date   Arthritis    Asthma    MVP (mitral valve prolapse)    Although no evidence per echo in 2009. Trace MR   Palpitation    Scoliosis    Urticaria     Vaginal Pap smear, abnormal     Past Surgical History:  Procedure Laterality Date   KIDNEY STONE SURGERY Bilateral 2000   WISDOM TOOTH EXTRACTION Bilateral 1991    OB History     Gravida  0   Para  0   Term  0   Preterm  0   AB  0   Living  0      SAB  0   IAB  0   Ectopic  0   Multiple  0   Live Births  0           Allergies  Allergen Reactions   Latex Rash    Social History   Socioeconomic History   Marital status: Divorced    Spouse name: Not on file   Number of children: Not on file   Years of education: Not on file   Highest education level: Not on file  Occupational History   Not on file  Tobacco Use   Smoking status: Never   Smokeless tobacco: Never  Vaping Use   Vaping Use: Never used  Substance and Sexual Activity   Alcohol use: Yes    Comment: occ   Drug use: No   Sexual activity: Yes    Birth control/protection: None  Other Topics Concern   Not on file  Social History Narrative  Not on file   Social Determinants of Health   Financial Resource Strain: Low Risk  (03/05/2021)   Overall Financial Resource Strain (CARDIA)    Difficulty of Paying Living Expenses: Not very hard  Food Insecurity: Food Insecurity Present (03/05/2021)   Hunger Vital Sign    Worried About Running Out of Food in the Last Year: Sometimes true    Ran Out of Food in the Last Year: Sometimes true  Transportation Needs: No Transportation Needs (03/05/2021)   PRAPARE - Administrator, Civil Service (Medical): No    Lack of Transportation (Non-Medical): No  Physical Activity: Sufficiently Active (03/05/2021)   Exercise Vital Sign    Days of Exercise per Week: 7 days    Minutes of Exercise per Session: 60 min  Stress: No Stress Concern Present (03/05/2021)   [employer] of Occupational Health - Occupational Stress Questionnaire    Feeling of Stress : Not at all  Social Connections: Moderately Isolated (03/05/2021)   Social Connection and  Isolation Panel [NHANES]    Frequency of Communication with Friends and Family: Once a week    Frequency of Social Gatherings with Friends and Family: Once a week    Attends Religious Services: More than 4 times per year    Active Member of [employer] or Organizations: Yes    Attends Engineer, structural: More than 4 times per year    Marital Status: Separated    Family History  Problem Relation Age of Onset   Heart disease Mother    Heart attack Mother    Breast cancer Mother    Urticaria Mother    Cancer Father    Cancer Paternal Grandfather    Emphysema Paternal Grandfather     Medications:       Current Outpatient Medications:    acyclovir (ZOVIRAX) 200 MG capsule, Take 400 mg by mouth 3 (three) times daily., Disp: , Rfl:    Azelastine HCl 137 MCG/SPRAY SOLN, Place 2 sprays into the nose 2 (two) times daily as needed., Disp: 90 mL, Rfl: 4   hydrOXYzine (ATARAX) 25 MG tablet, Take 25 mg by mouth every 8 (eight) hours as needed., Disp: , Rfl:    levalbuterol (XOPENEX HFA) 45 MCG/ACT inhaler, Inhale 2 puffs into the lungs every 6 (six) hours as needed for wheezing., Disp: 15 g, Rfl: 1   levocetirizine (XYZAL) 5 MG tablet, TAKE 1 TABLET BY MOUTH EVERY DAY IN THE EVENING, Disp: 30 tablet, Rfl: 11   meloxicam (MOBIC) 15 MG tablet, Take 1 tablet (15 mg total) by mouth daily as needed. Take 15 mg by mouth daily as needed., Disp: 30 tablet, Rfl: 0   metoprolol tartrate (LOPRESSOR) 25 MG tablet, Take 0.5 tablets (12.5 mg total) by mouth 2 (two) times daily., Disp: 90 tablet, Rfl: 3   mometasone (ASMANEX, 120 METERED DOSES,) 220 MCG/ACT inhaler, Inhale 2 puffs into the lungs daily., Disp: 3 each, Rfl: 1   Multiple Vitamin (MULTIVITAMIN) tablet, Take 1 tablet by mouth daily., Disp: , Rfl:   Objective Blood pressure 127/87, pulse 83, height 5\' 7"  (1.702 m), weight 157 lb (71.2 kg), last menstrual period 12/10/2021.  Tender oblong area in the left breast upper outer  quadtrant  Pertinent ROS No burning with urination, frequency or urgency No nausea, vomiting or diarrhea Nor fever chills or other constitutional symptoms   Labs or studies     Impression + Management Plan: Diagnoses this Encounter::   ICD-10-CM   1. Mass of  upper outer quadrant of left breast  N63.21 MM Digital Diagnostic Unilat L    US BREAST COMPLETE UNI LEFT INC AXILLA    US BREAST COMPLETE UNI LEFT INC AXILLA    MM Digital Diagnostic Unilat L        Medications prescribed during  this encounter: No orders of the defined types were placed in this encounter.   Labs or Scans Ordered during this encounter: Orders Placed This Encounter  Procedures   MM Digital Diagnostic Unilat L   US BREAST COMPLETE UNI LEFT INC AXILLA      Follow up Return if symptoms worsen or fail to improve.

## 2021-12-18 DIAGNOSIS — M542 Cervicalgia: Secondary | ICD-10-CM | POA: Diagnosis not present

## 2021-12-18 DIAGNOSIS — M9902 Segmental and somatic dysfunction of thoracic region: Secondary | ICD-10-CM | POA: Diagnosis not present

## 2021-12-18 DIAGNOSIS — M546 Pain in thoracic spine: Secondary | ICD-10-CM | POA: Diagnosis not present

## 2021-12-18 DIAGNOSIS — M9901 Segmental and somatic dysfunction of cervical region: Secondary | ICD-10-CM | POA: Diagnosis not present

## 2021-12-25 DIAGNOSIS — M9901 Segmental and somatic dysfunction of cervical region: Secondary | ICD-10-CM | POA: Diagnosis not present

## 2021-12-25 DIAGNOSIS — M546 Pain in thoracic spine: Secondary | ICD-10-CM | POA: Diagnosis not present

## 2021-12-25 DIAGNOSIS — M542 Cervicalgia: Secondary | ICD-10-CM | POA: Diagnosis not present

## 2021-12-25 DIAGNOSIS — M9902 Segmental and somatic dysfunction of thoracic region: Secondary | ICD-10-CM | POA: Diagnosis not present

## 2021-12-31 ENCOUNTER — Ambulatory Visit (HOSPITAL_COMMUNITY): Admission: RE | Admit: 2021-12-31 | Payer: BC Managed Care – PPO | Source: Ambulatory Visit

## 2021-12-31 ENCOUNTER — Inpatient Hospital Stay (HOSPITAL_COMMUNITY): Admission: RE | Admit: 2021-12-31 | Payer: BC Managed Care – PPO | Source: Ambulatory Visit

## 2022-01-01 DIAGNOSIS — M542 Cervicalgia: Secondary | ICD-10-CM | POA: Diagnosis not present

## 2022-01-01 DIAGNOSIS — M546 Pain in thoracic spine: Secondary | ICD-10-CM | POA: Diagnosis not present

## 2022-01-01 DIAGNOSIS — M9901 Segmental and somatic dysfunction of cervical region: Secondary | ICD-10-CM | POA: Diagnosis not present

## 2022-01-01 DIAGNOSIS — M9902 Segmental and somatic dysfunction of thoracic region: Secondary | ICD-10-CM | POA: Diagnosis not present

## 2022-01-07 ENCOUNTER — Encounter (HOSPITAL_COMMUNITY): Payer: BC Managed Care – PPO

## 2022-01-07 ENCOUNTER — Ambulatory Visit (HOSPITAL_COMMUNITY): Payer: BC Managed Care – PPO

## 2022-01-08 DIAGNOSIS — M546 Pain in thoracic spine: Secondary | ICD-10-CM | POA: Diagnosis not present

## 2022-01-08 DIAGNOSIS — M9901 Segmental and somatic dysfunction of cervical region: Secondary | ICD-10-CM | POA: Diagnosis not present

## 2022-01-08 DIAGNOSIS — M9902 Segmental and somatic dysfunction of thoracic region: Secondary | ICD-10-CM | POA: Diagnosis not present

## 2022-01-08 DIAGNOSIS — M542 Cervicalgia: Secondary | ICD-10-CM | POA: Diagnosis not present

## 2022-01-14 ENCOUNTER — Encounter (HOSPITAL_COMMUNITY): Payer: BC Managed Care – PPO

## 2022-01-14 ENCOUNTER — Ambulatory Visit (HOSPITAL_COMMUNITY): Payer: BC Managed Care – PPO

## 2022-01-15 ENCOUNTER — Ambulatory Visit (INDEPENDENT_AMBULATORY_CARE_PROVIDER_SITE_OTHER): Payer: BC Managed Care – PPO

## 2022-01-15 DIAGNOSIS — M9901 Segmental and somatic dysfunction of cervical region: Secondary | ICD-10-CM | POA: Diagnosis not present

## 2022-01-15 DIAGNOSIS — J309 Allergic rhinitis, unspecified: Secondary | ICD-10-CM | POA: Diagnosis not present

## 2022-01-15 DIAGNOSIS — M542 Cervicalgia: Secondary | ICD-10-CM | POA: Diagnosis not present

## 2022-01-15 DIAGNOSIS — M546 Pain in thoracic spine: Secondary | ICD-10-CM | POA: Diagnosis not present

## 2022-01-15 DIAGNOSIS — M9902 Segmental and somatic dysfunction of thoracic region: Secondary | ICD-10-CM | POA: Diagnosis not present

## 2022-01-22 DIAGNOSIS — M546 Pain in thoracic spine: Secondary | ICD-10-CM | POA: Diagnosis not present

## 2022-01-22 DIAGNOSIS — M542 Cervicalgia: Secondary | ICD-10-CM | POA: Diagnosis not present

## 2022-01-22 DIAGNOSIS — M9901 Segmental and somatic dysfunction of cervical region: Secondary | ICD-10-CM | POA: Diagnosis not present

## 2022-01-22 DIAGNOSIS — M9902 Segmental and somatic dysfunction of thoracic region: Secondary | ICD-10-CM | POA: Diagnosis not present

## 2022-01-28 ENCOUNTER — Encounter: Payer: Self-pay | Admitting: Orthopedic Surgery

## 2022-01-28 ENCOUNTER — Other Ambulatory Visit: Payer: Self-pay | Admitting: Orthopedic Surgery

## 2022-01-28 ENCOUNTER — Ambulatory Visit (INDEPENDENT_AMBULATORY_CARE_PROVIDER_SITE_OTHER): Payer: BC Managed Care – PPO | Admitting: Orthopedic Surgery

## 2022-01-28 ENCOUNTER — Ambulatory Visit (INDEPENDENT_AMBULATORY_CARE_PROVIDER_SITE_OTHER): Payer: BC Managed Care – PPO

## 2022-01-28 VITALS — Ht 67.0 in | Wt 156.0 lb

## 2022-01-28 DIAGNOSIS — M25522 Pain in left elbow: Secondary | ICD-10-CM

## 2022-01-28 DIAGNOSIS — M7712 Lateral epicondylitis, left elbow: Secondary | ICD-10-CM | POA: Diagnosis not present

## 2022-01-28 NOTE — Patient Instructions (Addendum)
Instructions Following Joint Injections  In clinic today, you received an injection in one of your joints (sometimes more than one).  Occasionally, you can have some pain at the injection site, this is normal.  You can place ice at the injection site, or take over-the-counter medications such as Tylenol (acetaminophen) or Advil (ibuprofen).  Please follow all directions listed on the bottle.  If your joint (knee or shoulder) becomes swollen, red or very painful, please contact the clinic for additional assistance.   Two medications were injected, including lidocaine and a steroid (often referred to as cortisone).  Lidocaine is effective almost immediately but wears off quickly.  However, the steroid can take a few days to improve your symptoms.  In some cases, it can make your pain worse for a couple of days.  Do not be concerned if this happens as it is common.  You can apply ice or take some over-the-counter medications as needed.      [facility] Elbow Rehab Do exercises exactly as told by your health care provider and adjust them as directed. It is normal to feel mild stretching, pulling, tightness, or discomfort as you do these exercises. Stop right away if you feel sudden pain or your pain gets worse.   Stretching and range-of-motion exercises These exercises warm up your muscles and joints and improve the movement andflexibility of your elbow. Wrist flexion, assisted  Straighten your left / right elbow in front of you with your palm facing down toward the floor. If told by your health care provider, bend your left / right elbow to a 90-degree angle (right angle) at your side instead of holding it straight. With your other hand, gently push over the back of your left / right hand so your fingers point toward the floor (flexion). Stop when you feel a gentle stretch on the back of your forearm. Hold this position for 10 seconds. Repeat 10 times. Complete this exercise 1-2 times a day. Wrist  extension, assisted  Straighten your left / right elbow in front of you with your palm facing up toward the ceiling. If told by your health care provider, bend your left / right elbow to a 90-degree angle (right angle) at your side instead of holding it straight. With your other hand, gently pull your left / right hand and fingers toward the floor (extension). Stop when you feel a gentle stretch on the palm side of your forearm. Hold this position for 10 seconds. Repeat 10 times. Complete this exercise 1-2 times a day. Assisted forearm rotation, supination Sit or stand with your elbows at your side. Bend your left / right elbow to a 90-degree angle (right angle). Using your uninjured hand, turn your left / right palm up toward the ceiling (supination) until you feel a gentle stretch along the inside of your forearm. Hold this position for 10 seconds. Repeat 10 times. Complete this exercise 1-2 times a day. Assisted forearm rotation, pronation Sit or stand with your elbows at your side. Bend your left / right elbow to a 90-degree angle (right angle). Using your uninjured hand, turn your left / right palm down toward the floor (pronation) until you feel a gentle stretch along the outside of your forearm. Hold this position for 10 seconds. Repeat 10 times. Complete this exercise 1-2 times a day. Strengthening exercises These exercises build strength and endurance in your forearm and elbow. Endurance is the ability to use your muscles for a long time, even after theyget tired. Radial   deviation  Stand with a 5 lbs weight or a hammer in your left / right hand. Or, sit while holding a rubber exercise band or tubing, with your left / right forearm supported on a table or countertop. Position your forearm so that the thumb is facing the ceiling, as if you are going to clap your hands. This is the neutral position. Raise your hand upward in front of you so your thumb moves toward the ceiling (radial  deviation), or pull up on the rubber tubing. Keep your forearm and elbow still while you move your wrist only. Hold this position for 10 seconds. Slowly return to the starting position. Repeat 10 times. Complete this exercise 1-2 times a day. Wrist extension, eccentric Sit with your left / right forearm palm-down and supported on a table or other surface. Let your left / right wrist extend over the edge of the surface. Hold a 5 lbs (can of soup) weight or a piece of exercise band or tubing in your left / right hand. If using a rubber exercise band or tubing, hold the other end of the tubing with your other hand. Use your uninjured hand to move your left / right hand up toward the ceiling. Take your uninjured hand away and slowly return to the starting position using only your left / right hand. Lowering your arm under tension is called eccentric extension. Repeat 10 times. Complete this exercise 1-2 times a day. Wrist extension  Do not do this exercise if it causes pain at the outside of your elbow. Only do this exercise once instructed by your health care provider. Sit with your left / right forearm supported on a table or other surface and your palm turned down toward the floor. Let your left / right wrist extend over the edge of the surface. Hold a 5 lbs weight or a piece of rubber exercise band or tubing. If you are using a rubber exercise band or tubing, hold the band or tubing in place with your other hand to provide resistance. Slowly bend your wrist so your hand moves up toward the ceiling (extension). Move only your wrist, keeping your forearm and elbow still. Hold this position for 10 seconds. Slowly return to the starting position. Repeat 10 times. Complete this exercise 1-2 times a day. Forearm rotation, supination To do this exercise, you will need a lightweight hammer or rubber mallet. Sit with your left / right forearm supported on a table or other surface. Bend your elbow to a  90-degree angle (right angle). Position your forearm so that your palm is facing down toward the floor, with your hand resting over the edge of the table. Hold a hammer in your left / right hand. To make this exercise easier, hold the hammer near the head of the hammer. To make this exercise harder, hold the hammer near the end of the handle. Without moving your wrist or elbow, slowly rotate your forearm so your palm faces up toward the ceiling (supination). Hold this position for 10 seconds. Slowly return to the starting position. Repeat 10 times. Complete this exercise 1-2 times a day. Shoulder blade squeeze Sit in a stable chair or stand with good posture. If you are sitting down, do not let your back touch the back of the chair. Your arms should be at your sides with your elbows bent to a 90-degree angle (right angle). Position your forearms so that your thumbs are facing the ceiling (neutral position). Without lifting your shoulders   up, squeeze your shoulder blades tightly together. Hold this position for 10 seconds. Slowly release and return to the starting position. Repeat 10 times. Complete this exercise 1-2 times a day. This information is not intended to replace advice given to you by your health care provider. Make sure you discuss any questions you have with your healthcare provider. Document Revised: 03/16/2019 Document Reviewed: 03/16/2019 Elsevier Patient Education  2022 Elsevier Inc.  

## 2022-01-28 NOTE — Progress Notes (Signed)
New Patient Visit  Assessment: Mariah Burnett is a 47 y.o. female with the following: 1. Lateral epicondylitis of left elbow   Plan: Mariah Burnett has pain in the left lateral elbow.  Pain is consistent with lateral epicondylitis.  She has attempted multiple treatment modalities, which are appropriate.  I would encourage her to continue using a strap.  I provided her with additional exercises, which may be similar to exercise she had previously attempted.  We can consider formal physical therapy as well.  We discussed proceeding with an injection in clinic today.  This was completed without issues.  Exercises were provided.   Procedure note injection Left lateral elbow   Verbal consent was obtained to inject the left lateral elbow, common extensor tendon Timeout was completed to confirm the site of injection.  The skin was prepped with alcohol and ethyl chloride was sprayed at the injection site.  A 21-gauge needle was used to inject 40 mg of Depo-Medrol and 1% lidocaine (1 cc) at the insertion of the common extensor tendon using a direct lateral approach.  There were no complications. A sterile bandage was applied.   Follow-up: Return if symptoms worsen or fail to improve.  Subjective:  Chief Complaint  Patient presents with   Elbow Pain    Lt elbow pain for 6 mos. Has tried [place] machine, and states icing has helped the best.    History of Present Illness: Mariah Burnett is a 46 y.o. female who has been referred by Mariah Burnett, DC for evaluation of left elbow pain.  She has had pain over the lateral left elbow for the past 6 months.  She had started working out in the gym, and noted a pulling sensation in the elbow.  Shortly thereafter, she started to have pain.  The pain has persisted.  She has started working with a Restaurant manager, fast food, who has tried a number of different treatment modalities, including ultrasound.  She states the ultrasound has provided some improvement in her symptoms.  In  addition, she has tried Voltaren gel, and ice.  She has worn a strap on the left arm.  She has not worked with formal physical therapy.  She has not had an injection.  Medications have not provided sustained relief.  Certain activities are still very painful.   Review of Systems: No fevers or chills No numbness or tingling No chest pain No shortness of breath No bowel or bladder dysfunction No GI distress No headaches   Medical History:  Past Medical History:  Diagnosis Date   Arthritis    Asthma    MVP (mitral valve prolapse)    Although no evidence per echo in 2009. Trace MR   Palpitation    Scoliosis    Urticaria    Vaginal Pap smear, abnormal     Past Surgical History:  Procedure Laterality Date   KIDNEY STONE SURGERY Bilateral 2000   WISDOM TOOTH EXTRACTION Bilateral 1991    Family History  Problem Relation Age of Onset   Heart disease Mother    Heart attack Mother    Breast cancer Mother    Urticaria Mother    Cancer Father    Cancer Paternal Grandfather    Emphysema Paternal Grandfather    Social History   Tobacco Use   Smoking status: Never   Smokeless tobacco: Never  Vaping Use   Vaping Use: Never used  Substance Use Topics   Alcohol use: Yes    Comment: occ   Drug use: No  Allergies  Allergen Reactions   Latex Rash    Current Meds  Medication Sig   acyclovir (ZOVIRAX) 200 MG capsule Take 400 mg by mouth 3 (three) times daily.   Azelastine HCl 137 MCG/SPRAY SOLN Place 2 sprays into the nose 2 (two) times daily as needed.   hydrOXYzine (ATARAX) 25 MG tablet Take 25 mg by mouth every 8 (eight) hours as needed.   levalbuterol (XOPENEX HFA) 45 MCG/ACT inhaler Inhale 2 puffs into the lungs every 6 (six) hours as needed for wheezing.   levocetirizine (XYZAL) 5 MG tablet TAKE 1 TABLET BY MOUTH EVERY DAY IN THE EVENING   meloxicam (MOBIC) 15 MG tablet Take 1 tablet (15 mg total) by mouth daily as needed. Take 15 mg by mouth daily as needed.    metoprolol tartrate (LOPRESSOR) 25 MG tablet Take 0.5 tablets (12.5 mg total) by mouth 2 (two) times daily.   mometasone (ASMANEX, 120 METERED DOSES,) 220 MCG/ACT inhaler Inhale 2 puffs into the lungs daily.   Multiple Vitamin (MULTIVITAMIN) tablet Take 1 tablet by mouth daily.    Objective: Ht 5\' 7"  (1.702 m)   Wt 156 lb (70.8 kg)   BMI 24.43 kg/m   Physical Exam:  General: Alert and oriented. and No acute distress. Gait: Normal gait.  Left elbow without deformity.  No swelling.  No redness.  Tenderness to palpation over the lateral epicondyle.  She has pain over the lateral elbow with resisted long finger extension.  Pain with resisted wrist extension.  She also has some discomfort within the muscles of the extensor wad.  She has pain with pronation and supination.  Fingers are warm and well-perfused.  2+ radial pulse.  Sensation intact throughout the left hand.  IMAGING: I personally ordered and reviewed the following images  X-rays left elbow were obtained in clinic today.  No acute injuries are noted.  No dislocation.  Joint spaces well-maintained.  No osteophytes.  No bony lesions.  Impression: Negative left elbow x-ray   New Medications:  No orders of the defined types were placed in this encounter.     Mordecai Rasmussen, MD  01/28/2022 10:28 AM

## 2022-02-19 ENCOUNTER — Ambulatory Visit (INDEPENDENT_AMBULATORY_CARE_PROVIDER_SITE_OTHER): Payer: BC Managed Care – PPO

## 2022-02-19 DIAGNOSIS — J309 Allergic rhinitis, unspecified: Secondary | ICD-10-CM | POA: Diagnosis not present

## 2022-02-24 ENCOUNTER — Telehealth: Payer: Self-pay | Admitting: Physician Assistant

## 2022-02-24 NOTE — Telephone Encounter (Signed)
Rec'd following msg: Chevis Pretty, RN; Charlie Pitter, PA-C Called and spoke w/ the pt and she does not want to have the echo done at this time.

## 2022-02-24 NOTE — Telephone Encounter (Signed)
Covering B. Strader's inbox. Received expiring notification of echo order placed 02/2021 to evaluate mitral valve prolapse, looks like pt did not go for this. Pt has f/u appt with Dr. Dellia Cloud coming up soon. Please reach out to pt to find out if she wants to schedule this. Thank you.

## 2022-02-24 NOTE — Telephone Encounter (Signed)
Forward message to A.Adkins to schedule echo

## 2022-03-04 ENCOUNTER — Other Ambulatory Visit: Payer: Self-pay | Admitting: Family Medicine

## 2022-03-04 ENCOUNTER — Encounter: Payer: Self-pay | Admitting: Internal Medicine

## 2022-03-04 ENCOUNTER — Ambulatory Visit: Payer: BC Managed Care – PPO | Attending: Internal Medicine | Admitting: Internal Medicine

## 2022-03-04 VITALS — BP 104/70 | HR 81 | Ht 67.0 in | Wt 161.0 lb

## 2022-03-04 DIAGNOSIS — R0789 Other chest pain: Secondary | ICD-10-CM | POA: Diagnosis not present

## 2022-03-04 DIAGNOSIS — R002 Palpitations: Secondary | ICD-10-CM

## 2022-03-04 DIAGNOSIS — Z8249 Family history of ischemic heart disease and other diseases of the circulatory system: Secondary | ICD-10-CM | POA: Diagnosis not present

## 2022-03-04 MED ORDER — METOPROLOL TARTRATE 25 MG PO TABS: 12.5000 mg | ORAL_TABLET | Freq: Two times a day (BID) | ORAL | 3 refills | Status: DC

## 2022-03-04 NOTE — Progress Notes (Signed)
Cardiology Office Note  Date: 03/04/2022   ID: Mariah Burnett, DOB 01/27/1975, MRN [id number]  PCP:  Johnette Abraham, MD  Cardiologist:  None Electrophysiologist:  None   Reason for Office Visit: Follow-up palpitations   History of Present Illness: Mariah Burnett is a 47 y.o. female known to have asthma presented to cardiology clinic for follow-up of palpitations.  Patient was initially referred to cardiology clinic for evaluation of palpitations which were controlled with metoprolol tartrate 12.5 mg twice daily with as needed metoprolol for as needed palpitations. She had diagnosis of mitral valve prolapse but echocardiogram from 2019 and 2012 showed no evidence of MVP, had only trivial MR.  She presented today for follow-up visit.  Palpitations resolved with intermittent palpitations lasting for 20 seconds requiring to take as needed metoprolol. Does not have any symptoms of SOB, dizziness, lightness, syncope, leg swelling. She reported having chest tightness during brisk walking that lasted a few minutes and happens once or twice per month. Does not recall how long this has been going on. But she also started to go back to gym few weeks ago and started to do cardio workout and treadmill workout with no reproducibility of symptoms like angina.  Denies smoking cigarettes. Her mother had PCI when she was in her early 84s.  Past Medical History:  Diagnosis Date   Arthritis    Asthma    MVP (mitral valve prolapse)    Although no evidence per echo in 2009. Trace MR   Palpitation    Scoliosis    Urticaria    Vaginal Pap smear, abnormal     Past Surgical History:  Procedure Laterality Date   KIDNEY STONE SURGERY Bilateral 2000   WISDOM TOOTH EXTRACTION Bilateral 1991    Current Outpatient Medications  Medication Sig Dispense Refill   acyclovir (ZOVIRAX) 200 MG capsule Take 400 mg by mouth 3 (three) times daily.     Azelastine HCl 137 MCG/SPRAY SOLN Place 2 sprays into the nose 2  (two) times daily as needed. 90 mL 4   hydrOXYzine (ATARAX) 25 MG tablet Take 25 mg by mouth every 8 (eight) hours as needed.     levalbuterol (XOPENEX HFA) 45 MCG/ACT inhaler Inhale 2 puffs into the lungs every 6 (six) hours as needed for wheezing. 15 g 1   levocetirizine (XYZAL) 5 MG tablet TAKE 1 TABLET BY MOUTH EVERY DAY IN THE EVENING 30 tablet 11   meloxicam (MOBIC) 15 MG tablet Take 1 tablet (15 mg total) by mouth daily as needed. Take 15 mg by mouth daily as needed. 30 tablet 0   mometasone (ASMANEX, 120 METERED DOSES,) 220 MCG/ACT inhaler Inhale 2 puffs into the lungs daily. 3 each 1   Multiple Vitamin (MULTIVITAMIN) tablet Take 1 tablet by mouth daily.     metoprolol tartrate (LOPRESSOR) 25 MG tablet Take 0.5 tablets (12.5 mg total) by mouth 2 (two) times daily. 90 tablet 3   No current facility-administered medications for this visit.   Allergies:  Latex   Social History: The patient  reports that she has never smoked. She has never used smokeless tobacco. She reports current alcohol use. She reports that she does not use drugs.   Family History: The patient's family history includes Breast cancer in her mother; Cancer in her father and paternal grandfather; Emphysema in her paternal grandfather; Heart attack in her mother; Heart disease in her mother; Urticaria in her mother.   ROS:  Please see the history of present illness. Otherwise,  complete review of systems is positive for none.  All other systems are reviewed and negative.   Physical Exam: VS:  BP 104/70   Pulse 81   Ht '5\' 7"'$  (1.702 m)   Wt 161 lb (73 kg)   BMI 25.22 kg/m , BMI Body mass index is 25.22 kg/m.  Wt Readings from Last 3 Encounters:  03/04/22 161 lb (73 kg)  01/28/22 156 lb (70.8 kg)  12/17/21 157 lb (71.2 kg)    General: Patient appears comfortable at rest. HEENT: Conjunctiva and lids normal, oropharynx clear with moist mucosa. Neck: Supple, no elevated JVP or carotid bruits, no thyromegaly. Lungs:  Clear to auscultation, nonlabored breathing at rest. Cardiac: Regular rate and rhythm, no S3 or significant systolic murmur, no pericardial rub. Abdomen: Soft, nontender, no hepatomegaly, bowel sounds present, no guarding or rebound. Extremities: No pitting edema, distal pulses 2+. Skin: Warm and dry. Musculoskeletal: No kyphosis. Neuropsychiatric: Alert and oriented x3, affect grossly appropriate.  ECG:  An ECG dated 03/04/2022 was personally reviewed today and demonstrated:  Normal sinus rhythm, no ST-T changes  Recent Labwork: No results found for requested labs within last 365 days.     Component Value Date/Time   CHOL 179 04/27/2018 1512   TRIG 95 04/27/2018 1512   HDL 61 04/27/2018 1512   CHOLHDL 2.9 04/27/2018 1512   CHOLHDL 3 09/25/2010 0837   VLDL 25.8 09/25/2010 0837   LDLCALC 99 04/27/2018 1512    Other Studies Reviewed Today: Gram from 2009 and 2012 showed normal LVEF, trivial MR and no evidence of mitral valve prolapse.  Assessment and Plan: Patient is a 47 year old F known to have asthma presented to cardiology clinic for follow-up visit.  # Palpitations -Continue metoprolol titrate 12.5 mg twice daily for symptomatic relief. Does not have to take as needed metoprolol for palpitations lasting 20 seconds. Instructed patient to take as needed metoprolol only if palpitations last for more than a few minutes.  # History of mitral valve prolapse -Echocardiogram from 2009 and 2012 showed no evidence of mitral valve prolapse.  Trivial MR was noted. No indication to repeat echocardiogram in the absence of symptoms.  # Chest tightness # Family history premature ASCVD, mother had PCI in early 39s -Patient reported substernal chest tightness during wrist walking, lasting for a few minutes, occurring once or twice per month and does not recall when it began.  However she has been going to the gym recently and did not notice any symptoms of chest tightness during cardio workout  or treadmill workout. Instructed patient to keep a log of symptoms of chest tightness. If she develops frequent/recurrent chest tightness episodes with exertion in the future, she will benefit from stress testing.  I have spent a total of 30 minutes with patient reviewing chart, EKGs, labs and examining patient as well as establishing an assessment and plan that was discussed with the patient.  > 50% of time was spent in direct patient care.     Medication Adjustments/Labs and Tests Ordered: Current medicines are reviewed at length with the patient today.  Concerns regarding medicines are outlined above.   Tests Ordered: Orders Placed This Encounter  Procedures   EKG 12-Lead    Medication Changes: Meds ordered this encounter  Medications   metoprolol tartrate (LOPRESSOR) 25 MG tablet    Sig: Take 0.5 tablets (12.5 mg total) by mouth 2 (two) times daily.    Dispense:  90 tablet    Refill:  3    Disposition:  Follow up  one year  Signed, Prentiss Hammett Fidel Levy, MD, 03/04/2022 8:55 AM    [facility] [address] [place], [place] [address]

## 2022-03-04 NOTE — Patient Instructions (Signed)
Medication Instructions:  Your physician recommends that you continue on your current medications as directed. Please refer to the Current Medication list given to you today.  *If you need a refill on your cardiac medications before your next appointment, please call your pharmacy*   Lab Work: None If you have labs (blood work) drawn today and your tests are completely normal, you will receive your results only by: [place] (if you have MyChart) OR A paper copy in the mail If you have any lab test that is abnormal or we need to change your treatment, we will call you to review the results.   Testing/Procedures: None   Follow-Up: At [facility], you and your health needs are our priority.  As part of our continuing mission to provide you with exceptional heart care, we have created designated Provider Care Teams.  These Care Teams include your primary Cardiologist (physician) and Advanced Practice Providers (APPs -  Physician Assistants and Nurse Practitioners) who all work together to provide you with the care you need, when you need it.  We recommend signing up for the patient portal called "MyChart".  Sign up information is provided on this After Visit Summary.  MyChart is used to connect with patients for Virtual Visits (Telemedicine).  Patients are able to view lab/test results, encounter notes, upcoming appointments, etc.  Non-urgent messages can be sent to your provider as well.   To learn more about what you can do with MyChart, go to [email or web address].    Your next appointment:   1 year(s)  Provider:   Claudina Lick, MD    Other Instructions

## 2022-03-06 ENCOUNTER — Encounter: Payer: Self-pay | Admitting: Radiology

## 2022-03-07 ENCOUNTER — Other Ambulatory Visit: Payer: Self-pay

## 2022-03-07 ENCOUNTER — Encounter: Payer: Self-pay | Admitting: Allergy & Immunology

## 2022-03-07 ENCOUNTER — Ambulatory Visit (INDEPENDENT_AMBULATORY_CARE_PROVIDER_SITE_OTHER): Payer: BC Managed Care – PPO | Admitting: Allergy & Immunology

## 2022-03-07 VITALS — BP 110/74 | HR 75 | Temp 97.6°F | Resp 20 | Ht 67.0 in | Wt 160.8 lb

## 2022-03-07 DIAGNOSIS — J309 Allergic rhinitis, unspecified: Secondary | ICD-10-CM

## 2022-03-07 DIAGNOSIS — J4520 Mild intermittent asthma, uncomplicated: Secondary | ICD-10-CM

## 2022-03-07 DIAGNOSIS — J302 Other seasonal allergic rhinitis: Secondary | ICD-10-CM

## 2022-03-07 MED ORDER — LEVOCETIRIZINE DIHYDROCHLORIDE 5 MG PO TABS: 5.0000 mg | ORAL_TABLET | Freq: Every evening | ORAL | 3 refills | Status: DC

## 2022-03-07 MED ORDER — AZELASTINE HCL 137 MCG/SPRAY NA SOLN: 2.0000 | Freq: Two times a day (BID) | NASAL | 4 refills | Status: DC | PRN

## 2022-03-07 NOTE — Progress Notes (Signed)
FOLLOW UP  Date of Service/Encounter:  03/07/22   Assessment:   Mild persistent asthma, uncomplicated   Seasonal and perennial allergic rhinitis (rabbit, grasses, ragweed, trees, indoor molds, outdoor molds, dust mites, cat, dog and cockroach) - on allergen immunotherapy with maintenance reached March 2021    Gustatory rhinitis  Plan/Recommendations:    There are no Patient Instructions on file for this visit.   Subjective:   Mariah Burnett is a 47 y.o. female presenting today for follow up of  Chief Complaint  Patient presents with   Follow-up    Pt states she is doing good so far.    Mariah Burnett has a history of the following: Patient Active Problem List   Diagnosis Date Noted   Chest tightness 03/04/2022   Family history of coronary artery disease in mother 03/04/2022   Hyperlipidemia 10/17/2021   Encounter to establish care 10/17/2021   Biceps tendinitis, left 10/17/2021   Mild intermittent asthma without complication [id number]   Seasonal and perennial allergic rhinitis 01/14/2021   Shortness of breath 09/25/2010   Palpitation    MVP (mitral valve prolapse)    Scoliosis     History obtained from: chart review and patient.  Mariah Burnett is a 47 y.o. female presenting for a follow up visit.  She was last seen in January 2023.  At that time, she was continued on albuterol 2 puffs every 4-6 hours as needed.  She has Asmanex that she adds during respiratory flares.  For her allergic rhinitis, she was continued on Xyzal as well as Nasacort.  She was also continued on allergen immunotherapy.  Since last visit, she has done well.   Asthma/Respiratory Symptom History: She has been working out more.  This has helped her a lot. She has not been using her rescue inhaler intermittently. She does not remember the last time that she needed it. She has not needed Asmanex in ages.   Allergic Rhinitis Symptom History: She is on levocetirizine every evening. She does use her nose  spray as needed.  She does have the azelastine as needed.  Mariah Burnett is on allergen immunotherapy. She receives two injections. Immunotherapy script #1 contains  rabbit, trees, grasses, cat, and dog. She currently receives 0.33m of the RED vial (1/100). Immunotherapy script #2 contains  ragweed, molds, dust mites, and cockroach. She currently receives 0.563mof the RED vial (1/100). She started shots September of 2020 and reached maintenance in March of 2021.   She ended up getting rid of her pet rabbits. She is not around any pets at all. The dander is the real trigger for her. She is fine with getting rid of rabbit in her vials.   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  Otherwise, there have been no changes to her past medical history, surgical history, family history, or social history.    Review of Systems  Constitutional: Negative.  Negative for fever, malaise/fatigue and weight loss.  HENT: Negative.  Negative for congestion, ear discharge and ear pain.   Eyes:  Negative for pain, discharge and redness.  Respiratory:  Negative for cough, sputum production, shortness of breath and wheezing.   Cardiovascular: Negative.  Negative for chest pain and palpitations.  Gastrointestinal:  Negative for abdominal pain, heartburn, nausea and vomiting.  Skin: Negative.  Negative for itching and rash.  Neurological:  Negative for dizziness and headaches.  Endo/Heme/Allergies:  Positive for environmental allergies. Does not bruise/bleed easily.       Objective:   Blood pressure 110/74, pulse 75, temperature 97.6 F (36.4 C), resp. rate 20, height '5\' 7"'$  (1.702 m), weight 160 lb 12.8 oz (72.9 kg), SpO2 97 %. Body mass index is 25.18 kg/m.    Physical Exam Vitals reviewed.  Constitutional:      Appearance: She is well-developed.  HENT:      Head: Normocephalic and atraumatic.     Right Ear: Tympanic membrane, ear canal and external ear normal.     Left Ear: Tympanic membrane, ear canal and external ear normal.     Nose: No nasal deformity, septal deviation, mucosal edema or rhinorrhea.     Right Turbinates: Enlarged. Not swollen.     Left Turbinates: Enlarged. Not swollen.     Right Sinus: No maxillary sinus tenderness or frontal sinus tenderness.     Left Sinus: No maxillary sinus tenderness or frontal sinus tenderness.     Mouth/Throat:     Mouth: Mucous membranes are not pale and not dry.     Pharynx: Uvula midline.  Eyes:     General: Lids are normal. No allergic shiner.       Right eye: No discharge.        Left eye: No discharge.     Conjunctiva/sclera: Conjunctivae normal.     Right eye: Right conjunctiva is not injected. No chemosis.    Left eye: Left conjunctiva is not injected. No chemosis.    Pupils: Pupils are equal, round, and reactive to light.  Cardiovascular:     Rate and Rhythm: Normal rate and regular rhythm.     Heart sounds: Normal heart sounds.  Pulmonary:     Effort: Pulmonary effort is normal. No tachypnea, accessory muscle usage or respiratory distress.     Breath sounds: Normal breath sounds. No wheezing, rhonchi or rales.  Chest:     Chest wall: No tenderness.  Lymphadenopathy:     Cervical: No cervical adenopathy.  Skin:    Coloration: Skin is not pale.     Findings: No abrasion, erythema, petechiae or rash. Rash is not papular, urticarial or vesicular.  Neurological:     Mental Status: She is alert.  Psychiatric:        Behavior: Behavior is cooperative.      Diagnostic studies:   Spirometry: results normal (FEV1: 2.29/73%, FVC: 4.92/126%, FEV1/FVC: 47%).    Spirometry consistent with mild obstructive disease.   Allergy Studies: none       Salvatore Marvel, MD  [facility]

## 2022-03-07 NOTE — Patient Instructions (Addendum)
1. Mild persistent asthma, uncomplicated - Lung testing looked decent today. - I am happy with how well you are doing!  - Daily controller medication(s): NOTHING - Prior to physical activity: albuterol 2 puffs 10-15 minutes before physical activity. - Rescue medications: albuterol 4 puffs every 4-6 hours as needed - Asthma control goals:  * Full participation in all desired activities (may need albuterol before activity) * Albuterol use two time or less a week on average (not counting use with activity) * Cough interfering with sleep two time or less a month * Oral steroids no more than once a year * No hospitalizations  2. Seasonal and perennial allergic rhinitis - Previous testing showed:  rabbit, grasses, ragweed, trees, indoor molds, outdoor molds, dust mites, cat, dog and cockroach - Continue with allergy shots at the same schedule. - You reached maintenance in March 2021, so we will continue until March 2016 at least (this is full five years at that point). - We are stopping carrying rabbit serum, so we will remove these from your vials going forward.  - Continue taking: Xyzal (levocetirizine) '5mg'$  tablet once daily and Astelin (azelastine) 2 sprays per nostril 1-2 times daily as needed - You can use an extra dose of the antihistamine, if needed, for breakthrough symptoms.  - Consider nasal saline rinses 1-2 times daily to remove allergens from the nasal cavities as well as help with mucous clearance (this is especially helpful to do before the nasal sprays are given) - 90 day refills for one year sent in.  3. Return in about 1 year (around 03/07/2023).    Please inform us of any Emergency Department visits, hospitalizations, or changes in symptoms. Call us before going to the ED for breathing or allergy symptoms since we might be able to fit you in for a sick visit. Feel free to contact us anytime with any questions, problems, or concerns.  It was a pleasure to see you  today!  Websites that have reliable patient information: 1. American Academy of Asthma, Allergy, and Immunology: www.aaaai.org 2. Food Allergy Research and Education (FARE): foodallergy.org 3. Mothers of Asthmatics: http://www.asthmacommunitynetwork.org 4. American College of Allergy, Asthma, and Immunology: www.acaai.org  "Like" [place] on Facebook and Instagram for our latest updates!      Make sure you are registered to vote! If you have moved or changed any of your contact information, you will need to get this updated before voting!

## 2022-03-08 ENCOUNTER — Other Ambulatory Visit: Payer: Self-pay | Admitting: Allergy & Immunology

## 2022-03-10 NOTE — Telephone Encounter (Signed)
Take one tablet once daily for runny nose or itchy eyes

## 2022-03-17 DIAGNOSIS — M9901 Segmental and somatic dysfunction of cervical region: Secondary | ICD-10-CM | POA: Diagnosis not present

## 2022-03-17 DIAGNOSIS — M9902 Segmental and somatic dysfunction of thoracic region: Secondary | ICD-10-CM | POA: Diagnosis not present

## 2022-03-17 DIAGNOSIS — M542 Cervicalgia: Secondary | ICD-10-CM | POA: Diagnosis not present

## 2022-03-17 DIAGNOSIS — M546 Pain in thoracic spine: Secondary | ICD-10-CM | POA: Diagnosis not present

## 2022-03-24 DIAGNOSIS — M9902 Segmental and somatic dysfunction of thoracic region: Secondary | ICD-10-CM | POA: Diagnosis not present

## 2022-03-24 DIAGNOSIS — M9901 Segmental and somatic dysfunction of cervical region: Secondary | ICD-10-CM | POA: Diagnosis not present

## 2022-03-24 DIAGNOSIS — M546 Pain in thoracic spine: Secondary | ICD-10-CM | POA: Diagnosis not present

## 2022-03-24 DIAGNOSIS — M542 Cervicalgia: Secondary | ICD-10-CM | POA: Diagnosis not present

## 2022-03-31 DIAGNOSIS — M546 Pain in thoracic spine: Secondary | ICD-10-CM | POA: Diagnosis not present

## 2022-03-31 DIAGNOSIS — M9901 Segmental and somatic dysfunction of cervical region: Secondary | ICD-10-CM | POA: Diagnosis not present

## 2022-03-31 DIAGNOSIS — M542 Cervicalgia: Secondary | ICD-10-CM | POA: Diagnosis not present

## 2022-03-31 DIAGNOSIS — M9902 Segmental and somatic dysfunction of thoracic region: Secondary | ICD-10-CM | POA: Diagnosis not present

## 2022-04-03 DIAGNOSIS — M542 Cervicalgia: Secondary | ICD-10-CM | POA: Diagnosis not present

## 2022-04-03 DIAGNOSIS — M546 Pain in thoracic spine: Secondary | ICD-10-CM | POA: Diagnosis not present

## 2022-04-03 DIAGNOSIS — M9901 Segmental and somatic dysfunction of cervical region: Secondary | ICD-10-CM | POA: Diagnosis not present

## 2022-04-03 DIAGNOSIS — M9902 Segmental and somatic dysfunction of thoracic region: Secondary | ICD-10-CM | POA: Diagnosis not present

## 2022-04-08 ENCOUNTER — Other Ambulatory Visit (HOSPITAL_COMMUNITY): Payer: Self-pay | Admitting: Obstetrics & Gynecology

## 2022-04-08 DIAGNOSIS — N6320 Unspecified lump in the left breast, unspecified quadrant: Secondary | ICD-10-CM

## 2022-04-09 ENCOUNTER — Other Ambulatory Visit (INDEPENDENT_AMBULATORY_CARE_PROVIDER_SITE_OTHER): Payer: BC Managed Care – PPO

## 2022-04-09 ENCOUNTER — Ambulatory Visit (INDEPENDENT_AMBULATORY_CARE_PROVIDER_SITE_OTHER): Payer: BC Managed Care – PPO | Admitting: Orthopedic Surgery

## 2022-04-09 ENCOUNTER — Encounter: Payer: Self-pay | Admitting: Orthopedic Surgery

## 2022-04-09 VITALS — BP 120/81 | HR 64 | Ht 67.0 in | Wt 161.0 lb

## 2022-04-09 DIAGNOSIS — M25512 Pain in left shoulder: Secondary | ICD-10-CM | POA: Diagnosis not present

## 2022-04-09 DIAGNOSIS — M7502 Adhesive capsulitis of left shoulder: Secondary | ICD-10-CM | POA: Diagnosis not present

## 2022-04-09 NOTE — Patient Instructions (Addendum)

## 2022-04-11 NOTE — Progress Notes (Signed)
Orthopaedic Clinic Return  Assessment: Mariah Burnett is a 47 y.o. female with the following: Adhesive capsulitis of the left shoulder.  Plan: Mariah Burnett has stiffness and pain in the left shoulder.  Physical exam is consistent with adhesive capsulitis.  She has decent forward flexion, but really struggles with internal and external rotation.  Small movements cause her some difficulty.  Radiographs are negative.  Pathology was discussed with the patient.  All questions were answered.  I am recommending a high-volume steroid injection.  This was completed in clinic today.  If he continues to have further issues, we may have to consider a referral to Dr. Shon BatonBrooks for advanced high-volume injections, with the intent of improving her pain and range of motion.  She states her understanding.  Follow-up as needed.  Procedure note injection - Left shoulder, ultrasound guidance -high-volume    Verbal consent was obtained to inject the Left shoulder, glenohumeral joint  Timeout was completed to confirm the site of injection.   Using the ultrasound, the rotator cuff tendons were identified.  The joint space was also identified. The skin was prepped with alcohol and ethyl chloride was sprayed at the injection site.  A 21-gauge needle was used to inject 40 mg of Depo-Medrol and 1% lidocaine (4 cc) and 5 cc saline into the glenohumeral joint space of the Left shoulder using a posterolateral approach.  The needle was visualized entering the glenohumeral joint, and the medication was also visualized. There were no complications.  A sterile bandage was applied.   Note: In order to accurately identify the placement of the needle, ultrasound was required, to increase the accuracy, and specificity of the injection.     Body mass index is 25.22 kg/m.  Follow-up: Return if symptoms worsen or fail to improve.   Subjective:  Chief Complaint  Patient presents with   Shoulder Pain    L shoulder pain gotten  worse over past mo. NKI, had an injection in elbow and thinks her compensating may have caused it.   NDC: [id number]    History of Present Illness: Mariah Burnett is a 47 y.o. female who returns to clinic for evaluation of left shoulder pain.  I previously seen her for her left elbow.  She has done well.  More recently, she started develop pain in the left shoulder.  She states is very painful.  She has been working with Dr. Ladona Ridgelaylor and doing exercises.  She notes minimal improvement.  Medications have not been effective.  She has not had an injection in her left shoulder.  Review of Systems: No fevers or chills No numbness or tingling No chest pain No shortness of breath No bowel or bladder dysfunction No GI distress No headaches    Objective: BP 120/81   Pulse 64   Ht 5\' 7"  (1.702 m)   Wt 161 lb (73 kg)   BMI 25.22 kg/m   Physical Exam:  Alert and oriented.  No acute distress.  Evaluation of left shoulder demonstrates no deformity.  No swelling is appreciated.  No redness.  Forward flexion to approximately 130 degrees before it becomes painful.  Internal rotation to the side of her hip.  Very limited external rotation at her side.  Fingers are warm well-perfused.  Sensation is intact throughout the left upper extremity.  IMAGING: I personally ordered and reviewed the following images:  X-rays of the left shoulder were obtained in clinic today.  No acute injuries noted.  Well-maintained joint space within the glenohumeral joint.  Glenohumeral joint is reduced.  No evidence of proximal humeral migration.  No bony lesions.  Impression: Negative left shoulder x-ray   Oliver Barre, MD 04/11/2022 2:06 PM

## 2022-04-16 ENCOUNTER — Telehealth: Payer: Self-pay | Admitting: Orthopedic Surgery

## 2022-04-16 ENCOUNTER — Ambulatory Visit (INDEPENDENT_AMBULATORY_CARE_PROVIDER_SITE_OTHER): Payer: BC Managed Care – PPO

## 2022-04-16 DIAGNOSIS — J309 Allergic rhinitis, unspecified: Secondary | ICD-10-CM

## 2022-04-16 NOTE — Telephone Encounter (Signed)
Pt states the shooting pain down the arm is better, but no changes anywhere else. Has been trying the exercises and doesn't seem to be helping any. Would like to know if there is anything else we can do at this point?

## 2022-04-16 NOTE — Telephone Encounter (Signed)
Dr. Dallas Schimke pt - patient called, stated that she saw Dr. Dallas Schimke last week and he had asked her to call and give feedback.  She would like a call from him or his nurse.  [phone]

## 2022-04-16 NOTE — Progress Notes (Signed)
VIAL EXP 04-16-23 

## 2022-04-16 NOTE — Telephone Encounter (Signed)
Left VM for a call back.

## 2022-04-17 DIAGNOSIS — J3089 Other allergic rhinitis: Secondary | ICD-10-CM | POA: Diagnosis not present

## 2022-04-18 NOTE — Telephone Encounter (Signed)
Called and let pt know information from provider, verbalized understanding and will contact us if not better next week.

## 2022-04-22 ENCOUNTER — Ambulatory Visit (HOSPITAL_COMMUNITY)
Admission: RE | Admit: 2022-04-22 | Discharge: 2022-04-22 | Disposition: A | Discharge status: Home / Self Care | Payer: BC Managed Care – PPO | Source: Ambulatory Visit | Attending: Obstetrics & Gynecology | Admitting: Obstetrics & Gynecology

## 2022-04-22 ENCOUNTER — Encounter (HOSPITAL_COMMUNITY): Payer: Self-pay

## 2022-04-22 ENCOUNTER — Other Ambulatory Visit (HOSPITAL_COMMUNITY): Payer: Self-pay | Admitting: Obstetrics & Gynecology

## 2022-04-22 DIAGNOSIS — N6323 Unspecified lump in the left breast, lower outer quadrant: Secondary | ICD-10-CM | POA: Diagnosis not present

## 2022-04-22 DIAGNOSIS — N6320 Unspecified lump in the left breast, unspecified quadrant: Secondary | ICD-10-CM | POA: Insufficient documentation

## 2022-04-24 ENCOUNTER — Ambulatory Visit (HOSPITAL_COMMUNITY)
Admission: RE | Admit: 2022-04-24 | Discharge: 2022-04-24 | Disposition: A | Discharge status: Home / Self Care | Payer: BC Managed Care – PPO | Source: Ambulatory Visit | Attending: Obstetrics & Gynecology | Admitting: Obstetrics & Gynecology

## 2022-04-24 DIAGNOSIS — N6323 Unspecified lump in the left breast, lower outer quadrant: Secondary | ICD-10-CM | POA: Diagnosis not present

## 2022-04-24 DIAGNOSIS — C50919 Malignant neoplasm of unspecified site of unspecified female breast: Secondary | ICD-10-CM

## 2022-04-24 DIAGNOSIS — R590 Localized enlarged lymph nodes: Secondary | ICD-10-CM | POA: Diagnosis not present

## 2022-04-24 DIAGNOSIS — C50912 Malignant neoplasm of unspecified site of left female breast: Secondary | ICD-10-CM | POA: Diagnosis not present

## 2022-04-24 DIAGNOSIS — N6320 Unspecified lump in the left breast, unspecified quadrant: Secondary | ICD-10-CM

## 2022-04-24 DIAGNOSIS — C773 Secondary and unspecified malignant neoplasm of axilla and upper limb lymph nodes: Secondary | ICD-10-CM | POA: Diagnosis not present

## 2022-04-24 HISTORY — PX: BREAST BIOPSY: SHX20

## 2022-04-24 HISTORY — DX: Malignant neoplasm of unspecified site of unspecified female breast: C50.919

## 2022-04-24 MED ORDER — LIDOCAINE-EPINEPHRINE (PF) 1 %-1:200000 IJ SOLN
30.0000 mL | Freq: Once | INTRAMUSCULAR | Status: AC
  Administered 2022-04-24: 30 mL via INTRADERMAL

## 2022-04-24 MED ORDER — LIDOCAINE HCL (PF) 2 % IJ SOLN
10.0000 mL | Freq: Once | INTRAMUSCULAR | Status: AC
  Administered 2022-04-24: 10 mL via INTRADERMAL

## 2022-04-24 MED ORDER — LIDOCAINE HCL (PF) 2 % IJ SOLN
INTRAMUSCULAR | Status: AC
  Filled 2022-04-24: qty 20

## 2022-04-30 LAB — SURGICAL PATHOLOGY

## 2022-05-01 DIAGNOSIS — Z170 Estrogen receptor positive status [ER+]: Secondary | ICD-10-CM | POA: Diagnosis not present

## 2022-05-01 DIAGNOSIS — C50512 Malignant neoplasm of lower-outer quadrant of left female breast: Secondary | ICD-10-CM | POA: Diagnosis not present

## 2022-05-02 ENCOUNTER — Other Ambulatory Visit: Payer: Self-pay | Admitting: General Surgery

## 2022-05-02 DIAGNOSIS — Z170 Estrogen receptor positive status [ER+]: Secondary | ICD-10-CM

## 2022-05-05 ENCOUNTER — Ambulatory Visit
Admission: RE | Admit: 2022-05-05 | Discharge: 2022-05-05 | Disposition: A | Discharge status: Home / Self Care | Payer: BC Managed Care – PPO | Source: Ambulatory Visit | Attending: General Surgery | Admitting: General Surgery

## 2022-05-05 DIAGNOSIS — N6012 Diffuse cystic mastopathy of left breast: Secondary | ICD-10-CM | POA: Diagnosis not present

## 2022-05-05 DIAGNOSIS — N6011 Diffuse cystic mastopathy of right breast: Secondary | ICD-10-CM | POA: Diagnosis not present

## 2022-05-05 DIAGNOSIS — D0512 Intraductal carcinoma in situ of left breast: Secondary | ICD-10-CM | POA: Diagnosis not present

## 2022-05-05 DIAGNOSIS — C773 Secondary and unspecified malignant neoplasm of axilla and upper limb lymph nodes: Secondary | ICD-10-CM | POA: Diagnosis not present

## 2022-05-05 DIAGNOSIS — Z170 Estrogen receptor positive status [ER+]: Secondary | ICD-10-CM

## 2022-05-05 MED ORDER — GADOPICLENOL 0.5 MMOL/ML IV SOLN
7.0000 mL | Freq: Once | INTRAVENOUS | Status: AC | PRN
  Administered 2022-05-05: 7 mL via INTRAVENOUS

## 2022-05-08 NOTE — Progress Notes (Signed)
New Breast Cancer Diagnosis: Left Breast LOQ  Did patient present with symptoms (if so, please note symptoms) or screening mammography?: Palpable mass    Location and Extent of disease :left breast. Measured 1.8 cm in greatest dimension. Adenopathy yes, 1 lymph node.  Histology per Pathology Report: grade 2, Invasive Ductal Carcinoma with papillary features.  04/24/2022  Receptor Status: ER(positive), PR (positive), Her2-neu (negative), Ki-(10%)   Surgeon and surgical plan, if any: Dr. Carolynne Edouard - I have discussed with her in detail the different options for treatment and at this point she is favoring breast conservation which I feel is very reasonable.  -She would be a candidate for sentinel node mapping and targeted node dissection.  -I will refer her to medical and radiation oncology to discuss possible neoadjuvant therapy which could shrink the cancer and the lymph node.     Medical oncologist, treatment if any:   Dr. Pamelia Hoit 05/16/2022   Family History of Breast/Ovarian/Prostate Cancer: Mother had breast cancer, 2 Maternal aunts and paternal grandmother had breast cancer.  Lymphedema issues, if any: No     Pain issues, if any: She reports feeling discomfort.     SAFETY ISSUES: Prior radiation? No Pacemaker/ICD? No Possible current pregnancy? N/a Is the patient on methotrexate? No  Current Complaints / other details:

## 2022-05-11 ENCOUNTER — Other Ambulatory Visit: Payer: Self-pay | Admitting: Student

## 2022-05-12 ENCOUNTER — Telehealth: Payer: Self-pay | Admitting: Orthopedic Surgery

## 2022-05-12 DIAGNOSIS — C50519 Malignant neoplasm of lower-outer quadrant of unspecified female breast: Secondary | ICD-10-CM | POA: Insufficient documentation

## 2022-05-12 DIAGNOSIS — M7502 Adhesive capsulitis of left shoulder: Secondary | ICD-10-CM

## 2022-05-12 NOTE — Telephone Encounter (Signed)
we can send her to see Dr. Shon Baton in [facility] for aggressive injections per Dr Lindwood Qua to refer? Mariah Burnett

## 2022-05-12 NOTE — Telephone Encounter (Signed)
Dr. Dallas Schimke pt - spoke w/the patient, she is wanting to speak to Dr. Dallas Schimke or [place].  She stated that her shoulder is getting worse and she wasn't sure if an injection would help or not.  I offered her an appointment, but she declined stating that the last time she was here that Dr. Dallas Schimke mentioned referring her to [facility].  [phone]

## 2022-05-12 NOTE — Progress Notes (Addendum)
Radiation Oncology         [phone] ________________________________  Name: Mariah Burnett        MRN: [id number]  Date of Service: 05/13/2022 DOB: April 28, 1975  [id number], Mariah Mellow, MD  Griselda Miner, MD     REFERRING PHYSICIAN: Chevis Pretty III, MD   DIAGNOSIS: The encounter diagnosis was Malignant neoplasm of lower-outer quadrant of left breast of female, estrogen receptor positive (HCC).   HISTORY OF PRESENT ILLNESS: Mariah Burnett is a 47 y.o. female seen at the request of Dr. Carolynne Edouard for a new diagnosis of left breast cancer. The patient noted a palpable mass in the left breast and diagnostic imaging confirmed this in the outer portion of the breast.  By ultrasound in the 4 o'clock position there was a hypoechoic mass measuring up to 1.8 cm, there was an abnormal appearing lymph node in the left axilla measuring 1.2 cm.  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  Her lymph node biopsy showed metastatic carcinoma.  Her tumor was ER/PR positive, HER2 negative with a Ki-67 of 10%.  She has a strong family history of breast cancer and ovarian cancer and underwent an MRI of both breasts on 05/05/2022, this showed the known area in the 4 o'clock position of the left breast measuring 2.3 cm and no other abnormalities were seen in the left breast concerning for cancer.  She has multiple small cysts.  A single axillary lymph node marked with a biopsy clip was seen without any additional adenopathy.  No other abnormalities were seen in the right breast.  She is favoring breast conserving surgery, but is also going to see Dr. Pamelia Hoit on 05/16/22 to determine if she is a candidate for neoadjuvant treatment.   PREVIOUS RADIATION THERAPY: No   PAST MEDICAL HISTORY:  Past Medical History:  Diagnosis Date   Arthritis    Asthma    Breast cancer (HCC) 04/24/2022   MVP (mitral valve prolapse)    Although no evidence  per echo in 2009. Trace MR   Palpitation    Scoliosis    Urticaria    Vaginal Pap smear, abnormal        PAST SURGICAL HISTORY: Past Surgical History:  Procedure Laterality Date   BREAST BIOPSY Left 04/24/2022   [place] LT BREAST BX W LOC DEV 1ST LESION IMG BX SPEC US GUIDE 04/24/2022 [facility]-UL[facility]ASOUND   BREAST BIOPSY Left 04/24/2022   [place] LT BREAST BX W LOC DEV EA ADD LESION IMG BX SPEC US GUIDE 04/24/2022 [facility]-ULTRASOUND   KIDNEY STONE SURGERY Bilateral 2000   WISDOM TOOTH EXTRACTION Bilateral 1991     FAMILY HISTORY:  Family History  Problem Relation Age of Onset   Heart disease Mother    Heart attack Mother    Breast cancer Mother    Urticaria Mother    Cancer Father    Renal cancer Father    Breast cancer Paternal Grandmother    Cancer Paternal Grandfather    Emphysema Paternal Grandfather    Breast cancer Maternal Aunt    Breast cancer Maternal Aunt      SOCIAL HISTORY:  reports that she has never smoked. She has never used smokeless tobacco. She reports current alcohol use. She reports that she does not use drugs. The patient is divorced and lives in [place]. She is accompanied by her partner [name]. 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  ALLERGIES: Latex   MEDICATIONS:  Current Outpatient Medications  Medication Sig Dispense Refill   acyclovir (ZOVIRAX) 200 MG capsule Take 400 mg by mouth 3 (three) times daily.     Azelastine HCl 137 MCG/SPRAY SOLN Place 2 sprays into the nose 2 (two) times daily as needed. 90 mL 4   desloratadine (CLARINEX) 5 MG tablet Take one tablet once daily as needed for runny nose or itchy eyes. 90 tablet 1   hydrOXYzine (ATARAX) 25 MG tablet Take 25 mg by mouth every 8 (eight) hours as needed.     levalbuterol (XOPENEX HFA) 45 MCG/ACT inhaler Inhale 2 puffs into the lungs every 6 (six) hours as needed for wheezing. 15 g 1   meloxicam (MOBIC) 15 MG tablet Take 1 tablet (15 mg total) by mouth daily as needed. Take 15 mg  by mouth daily as needed. 30 tablet 0   metoprolol tartrate (LOPRESSOR) 25 MG tablet TAKE 1/2 TABLET TWICE A DAY 90 tablet 3   Multiple Vitamin (MULTIVITAMIN) tablet Take 1 tablet by mouth daily.     mometasone (ASMANEX, 120 METERED DOSES,) 220 MCG/ACT inhaler Inhale 2 puffs into the lungs daily. (Patient not taking: Reported on 03/07/2022) 3 each 1   No current facility-administered medications for this encounter.     REVIEW OF SYSTEMS: On review of systems, the patient reports that she is doing well overall. She is ready to have a plan set in motion to determine if surgery comes first or if she would need chemotherapy. No breast specific complaints are verbalized     PHYSICAL EXAM:  Wt Readings from Last 3 Encounters:  05/13/22 155 lb 3.2 oz (70.4 kg)  04/09/22 161 lb (73 kg)  03/07/22 160 lb 12.8 oz (72.9 kg)   Temp Readings from Last 3 Encounters:  05/13/22 98.2 F (36.8 C)  04/24/22 98.2 F (36.8 C)  03/07/22 97.6 F (36.4 C)   BP Readings from Last 3 Encounters:  05/13/22 113/89  04/24/22 (!) 130/94  04/09/22 120/81   Pulse Readings from Last 3 Encounters:  05/13/22 92  04/24/22 78  04/09/22 64    In general this is a well appearing caucasian female in no acute distress. She's alert and oriented x4 and appropriate throughout the examination. Cardiopulmonary assessment is negative for acute distress and she exhibits normal effort. Bilateral breast exam is deferred.    ECOG = 1  0 - Asymptomatic (Fully active, able to carry on all predisease activities without restriction)  1 - Symptomatic but completely ambulatory (Restricted in physically strenuous activity but ambulatory and able to carry out work of a light or sedentary nature. For example, light housework, office work)  2 - Symptomatic, <50% in bed during the day (Ambulatory and capable of all self care but unable to carry out any work activities. Up and about more than 50% of waking hours)  3 - Symptomatic,  >50% in bed, but not bedbound (Capable of only limited self-care, confined to bed or chair 50% or more of waking hours)  4 - Bedbound (Completely disabled. Cannot carry on any self-care. Totally confined to bed or chair)  5 - Death   Santiago Glad MM, Creech RH, Tormey DC, et al. [phone]). "Toxicity and response criteria of the [facility]". Am. Evlyn Clines. Oncol. 5 (6): 649-55    LABORATORY DATA:  Lab Results  Component Value Date   WBC 5.4 04/05/2019   HGB 13.6 04/05/2019   HCT 40.2 04/05/2019   MCV 95.3 04/05/2019  PLT 202 04/05/2019   Lab Results  Component Value Date   NA 136 04/05/2019   K 3.4 (L) 04/05/2019   CL 103 04/05/2019   CO2 24 04/05/2019   Lab Results  Component Value Date   ALT 13 (L) 08/20/2015   AST 20 08/20/2015   ALKPHOS 37 (L) 08/20/2015   BILITOT 1.3 (H) 08/20/2015      RADIOGRAPHY: MR BREAST BILATERAL W WO CONTRAST INC CAD  Result Date: 05/05/2022 CLINICAL DATA:  Recently diagnosed invasive ductal carcinoma papillary features in the 4 o'clock position of the left breast and metastatic left axillary lymph node. EXAM: BILATERAL BREAST MRI WITH AND WITHOUT CONTRAST TECHNIQUE: Multiplanar, multisequence MR images of both breasts were obtained prior to and following the intravenous administration of 7 ml of VUEWAY Three-dimensional MR images were rendered by post-processing of the original MR data on an independent workstation. The three-dimensional MR images were interpreted, and findings are reported in the following complete MRI report for this study. Three dimensional images were evaluated at the independent interpreting workstation using the DynaCAD thin client. COMPARISON:  Recent mammogram, ultrasound and biopsy examinations. FINDINGS: Breast composition: c. Heterogeneous fibroglandular tissue. Background parenchymal enhancement: Mild. Right breast: No mass or abnormal enhancement. Multiple small cysts. Left breast: Irregular enhancing mass in  the posterior 4 o'clock position of the left breast containing a biopsy marker clip artifact. This measures 2.3 x 1.4 cm on image number 93/6. This measures 2.0 cm in length in the sagittal plane. This has rapid wash-in/washout and plateau enhancement kinetics. No other masses or areas of enhancement suspicious for malignancy in the left breast. Multiple small cysts. Lymph nodes: Single left axillary lymph node with eccentric cortical thickening containing a biopsy marker clip artifact. This corresponds to the recently biopsied metastatic node. No enlarged internal mammary or right axillary nodes. Ancillary findings:  None. IMPRESSION: 1. 2.3 x 2.0 x 1.4 cm biopsy-proven invasive ductal carcinoma in the 4 o'clock position of the left breast. 2. No evidence of malignancy elsewhere in either breast. 3. Single metastatic left axillary lymph node. 4. Multiple small bilateral breast cysts. RECOMMENDATION: Treatment plan. BI-RADS CATEGORY  6: Known biopsy-proven malignancy. Electronically Signed   By: Beckie Salts M.D.   On: 05/05/2022 16:02  [place] LT BREAST BX W LOC DEV 1ST LESION IMG BX SPEC US GUIDE  Addendum Date: 05/05/2022   ADDENDUM REPORT: 05/05/2022 09:03 ADDENDUM: PATHOLOGY revealed: Site A. BREAST, LEFT, 4 OC, NEEDLE CORE BIOPSY: - Invasive ductal carcinoma with papillary features, - Overall Grade: 2 - Lymphovascular invasion: Not identified - Cancer Length: 1.2 cm - Calcifications: Not identified. Pathology results are CONCORDANT with imaging findings, per Dr. Emmaline Kluver. PATHOLOGY revealed: Site B. LYMPH NODE, LEFT AXILLA, BIOPSY: - Metastatic carcinoma - No lymphoid tissue identified. Pathology results are CONCORDANT with imaging findings, per Dr. Emmaline Kluver. Pathology results and recommendations below were discussed with patient by telephone on 04/29/2022. Patient reported biopsy site within normal limits with slight tenderness at the site. Post biopsy care instructions were reviewed, questions  were answered and my direct phone number was provided to patient. Patient was instructed to call [facility] Mammography Department if any concerns or questions arise related to the biopsy. RECOMMENDATION: Surgical and oncological consultation. Request for surgical and oncological consultation was relayed to Ellin Mayhew RT at [facility] Mammography Department by Randa Lynn RN on 04/29/2022. Pathology results reported by Randa Lynn RN on 04/29/2022. Electronically Signed   By: Harriett Sine  Pete Glatter M.D.   On: 05/05/2022 09:03   Result Date: 05/05/2022 CLINICAL DATA:  47 year old female presenting for biopsy of a mass in the left breast. EXAM: ULTRASOUND GUIDED LEFT BREAST CORE NEEDLE BIOPSY COMPARISON:  Previous exam(s). PROCEDURE: I met with the patient and we discussed the procedure of ultrasound-guided biopsy, including benefits and alternatives. We discussed the high likelihood of a successful procedure. We discussed the risks of the procedure, including infection, bleeding, tissue injury, clip migration, and inadequate sampling. Informed written consent was given. The usual time-out protocol was performed immediately prior to the procedure. Lesion quadrant: Lower outer quadrant Using sterile technique and 1% Lidocaine as local anesthetic, under direct ultrasound visualization, a 14 gauge spring-loaded device was used to perform biopsy of mass in the left breast at 4 o'clock using a lateral approach. At the conclusion of the procedure a ribbon shaped tissue marker clip was deployed into the biopsy cavity. Follow up 2 view mammogram was performed and dictated separately. IMPRESSION: Ultrasound guided biopsy of a mass in the left breast at 4 o'clock. No apparent complications. Electronically Signed: By: Emmaline Kluver M.D. On: 04/24/2022 14:48   [place] LT BREAST BX W LOC DEV EA ADD LESION IMG BX SPEC US GUIDE  Addendum Date: 05/05/2022   ADDENDUM REPORT: 05/05/2022 09:03 ADDENDUM:  PATHOLOGY revealed: Site A. BREAST, LEFT, 4 OC, NEEDLE CORE BIOPSY: - Invasive ductal carcinoma with papillary features, - Overall Grade: 2 - Lymphovascular invasion: Not identified - Cancer Length: 1.2 cm - Calcifications: Not identified. Pathology results are CONCORDANT with imaging findings, per Dr. Emmaline Kluver. PATHOLOGY revealed: Site B. LYMPH NODE, LEFT AXILLA, BIOPSY: - Metastatic carcinoma - No lymphoid tissue identified. Pathology results are CONCORDANT with imaging findings, per Dr. Emmaline Kluver. Pathology results and recommendations below were discussed with patient by telephone on 04/29/2022. Patient reported biopsy site within normal limits with slight tenderness at the site. Post biopsy care instructions were reviewed, questions were answered and my direct phone number was provided to patient. Patient was instructed to call [facility] Mammography Department if any concerns or questions arise related to the biopsy. RECOMMENDATION: Surgical and oncological consultation. Request for surgical and oncological consultation was relayed to Ellin Mayhew RT at [facility] Mammography Department by Randa Lynn RN on 04/29/2022. Pathology results reported by Randa Lynn RN on 04/29/2022. Electronically Signed   By: Emmaline Kluver M.D.   On: 05/05/2022 09:03   Result Date: 05/05/2022 CLINICAL DATA:  47 year old female presenting for biopsy of a left axillary lymph node. EXAM: [place] AXILLARY NODE CORE BIOPSY LEFT COMPARISON:  Previous exam(s). PROCEDURE: I met with the patient and we discussed the procedure of ultrasound-guided biopsy, including benefits and alternatives. We discussed the high likelihood of a successful procedure. We discussed the risks of the procedure, including infection, bleeding, tissue injury, clip migration, and inadequate sampling. Informed written consent was given. The usual time-out protocol was performed immediately prior to the procedure. Using  sterile technique and 1% Lidocaine as local anesthetic, under direct ultrasound visualization, a 14 gauge spring-loaded device was used to perform biopsy of a left axillary lymph node using a lateral approach. At the conclusion of the procedure a HydroMARK tissue marker clip was deployed into the biopsy cavity. Follow up 2 view mammogram was performed and dictated separately. IMPRESSION: Ultrasound guided biopsy of a left axillary lymph node. No apparent complications. Electronically Signed: By: Emmaline Kluver M.D. On: 04/24/2022 14:50   MM CLIP PLACEMENT LEFT  Result  Date: 04/24/2022 CLINICAL DATA:  Post procedure mammogram clip placement EXAM: 3D DIAGNOSTIC LEFT MAMMOGRAM POST ULTRASOUND BIOPSY COMPARISON:  Previous exam(s). FINDINGS: 3D Mammographic images were obtained following ultrasound guided biopsy of a mass in the left breast at 4 o'clock. The ribbon biopsy marking clip is in expected position at the site of biopsy. 3D Mammographic images were obtained following ultrasound guided biopsy of a left axillary lymph node. The biopsy marking clip could not be visualized mammographically due to far posterior location but was well seen on ultrasound at the targeted lymph node. IMPRESSION: 1. Appropriate positioning of the ribbon shaped biopsy marking clip at the site of biopsy in the left breast at 4 o'clock. 2. Appropriate positioning of the [facility] biopsy marking clip in the left axilla best seen on ultrasound. Final Assessment: Post Procedure Mammograms for Marker Placement Electronically Signed   By: Emmaline Kluver M.D.   On: 04/24/2022 14:48  MM 3D DIAGNOSTIC MAMMOGRAM UNILATERAL LEFT BREAST  Result Date: 04/22/2022 CLINICAL DATA:  47 year old female with a palpable area of concern in the left breast. The patient initially noticed this around Christmas time and feels as if this mass has gotten smaller since then. The patient does have a history of bilateral breast cysts. EXAM: DIGITAL  DIAGNOSTIC UNILATERAL LEFT MAMMOGRAM WITH TOMOSYNTHESIS; ULTRASOUND LEFT BREAST LIMITED TECHNIQUE: Left digital diagnostic mammography and breast tomosynthesis was performed.; Targeted ultrasound examination of the left breast was performed. COMPARISON:  Previous exam(s). ACR Breast Density Category c: The breasts are heterogeneously dense, which may obscure small masses. FINDINGS: There is an oval mass with irregular margins in the outer left breast measuring approximately 1.6 cm corresponding to the area of palpable concern. Physical examination reveals a firm mass in the left breast at the approximate 4 o'clock position. Targeted ultrasound of the left breast was performed. There is an irregular hypoechoic mass in the left breast at 4 o'clock 4 cm from nipple measuring 1.4 x 1.3 x 1.8 cm. There is a 1.2 cm lymph node in the left axilla with a focal cortical bulge, with cortex thickness 0.4 cm. IMPRESSION: 1. Suspicious palpable 1.8 cm mass in the left breast at the 4 o'clock position. 2. Indeterminate lymph node in the left axilla demonstrating a focal cortical bulge. RECOMMENDATION: Recommend ultrasound-guided core biopsy of the mass in the left breast at the 4 o'clock position as well as ultrasound-guided core biopsy of the lymph node in the left axilla. I have discussed the findings and recommendations with the patient. If applicable, a reminder letter will be sent to the patient regarding the next appointment. BI-RADS CATEGORY  4: Suspicious. Electronically Signed   By: Edwin Cap M.D.   On: 04/22/2022 16:38  [place] LIMITED ULTRASOUND INCLUDING AXILLA LEFT BREAST   Result Date: 04/22/2022 CLINICAL DATA:  47 year old female with a palpable area of concern in the left breast. The patient initially noticed this around Christmas time and feels as if this mass has gotten smaller since then. The patient does have a history of bilateral breast cysts. EXAM: DIGITAL DIAGNOSTIC UNILATERAL LEFT MAMMOGRAM WITH  TOMOSYNTHESIS; ULTRASOUND LEFT BREAST LIMITED TECHNIQUE: Left digital diagnostic mammography and breast tomosynthesis was performed.; Targeted ultrasound examination of the left breast was performed. COMPARISON:  Previous exam(s). ACR Breast Density Category c: The breasts are heterogeneously dense, which may obscure small masses. FINDINGS: There is an oval mass with irregular margins in the outer left breast measuring approximately 1.6 cm corresponding to the area of palpable concern. Physical examination reveals a  firm mass in the left breast at the approximate 4 o'clock position. Targeted ultrasound of the left breast was performed. There is an irregular hypoechoic mass in the left breast at 4 o'clock 4 cm from nipple measuring 1.4 x 1.3 x 1.8 cm. There is a 1.2 cm lymph node in the left axilla with a focal cortical bulge, with cortex thickness 0.4 cm. IMPRESSION: 1. Suspicious palpable 1.8 cm mass in the left breast at the 4 o'clock position. 2. Indeterminate lymph node in the left axilla demonstrating a focal cortical bulge. RECOMMENDATION: Recommend ultrasound-guided core biopsy of the mass in the left breast at the 4 o'clock position as well as ultrasound-guided core biopsy of the lymph node in the left axilla. I have discussed the findings and recommendations with the patient. If applicable, a reminder letter will be sent to the patient regarding the next appointment. BI-RADS CATEGORY  4: Suspicious. Electronically Signed   By: Edwin Cap M.D.   On: 04/22/2022 16:38      IMPRESSION/PLAN: 1. Stage IIA, [id number], grade 2, ER/PR positive invasive ductal carcinoma of the left breast. Dr. Mitzi Hansen discusses the pathology findings and reviews the nature of node positive left breast disease. The patient will meet with Dr. Pamelia Hoit later this week to discuss possible neoadjuvant therapy versus proceeding with surgery and Oncotype dx testing. She is planning for breast conserving surgery with targeted node  dissection. Dr. Mitzi Hansen discusses the recommendation for external radiotherapy to the breast  and regional nodes to reduce risks of local recurrence followed by antiestrogen therapy. We discussed the risks, benefits, short, and long term effects of radiotherapy, as well as the curative intent, and the patient is interested in proceeding at the appropriate time. Dr. Mitzi Hansen discusses the delivery and logistics of radiotherapy and anticipates a course of 6 1/2 weeks of radiotherapy to the left breast and regional nodes. We will see her back at the conclusion of surgery if neoadjuvant therapy is given, or after completion of chemo if given adjuvantly, or after surgery if no chemo is planned. Her case will be reviewed as well in multidisciplinary breast oncology conference. 2. Possible genetic predisposition to malignancy. The patient is a candidate for genetic testing given her personal and family history. She had testing previously at her OB/GYN office about 10 years ago which she will bring a copy of. She will meet with our geneticist as well to see if she needs updates in testing. 3. Contraceptive Counseling. As we approach radiation we will review the need to avoid pregnancy.   In a visit lasting 60 minutes, greater than 50% of the time was spent face to face reviewing her case, as well as in preparation of, discussing, and coordinating the patient's care.  The above documentation reflects my direct findings during this shared patient visit. Please see the separate note by Dr. Mitzi Hansen on this date for the remainder of the patient's plan of care.    Osker Mason, [facility]    **Disclaimer: This note was dictated with voice recognition software. Similar sounding words can inadvertently be transcribed and this note may contain transcription errors which may not have been corrected upon publication of note.**

## 2022-05-13 ENCOUNTER — Ambulatory Visit
Admission: RE | Admit: 2022-05-13 | Discharge: 2022-05-13 | Disposition: A | Discharge status: Home / Self Care | Payer: BC Managed Care – PPO | Source: Ambulatory Visit | Attending: Radiation Oncology | Admitting: Radiation Oncology

## 2022-05-13 ENCOUNTER — Encounter: Payer: Self-pay | Admitting: Radiation Oncology

## 2022-05-13 VITALS — BP 113/89 | HR 92 | Temp 98.2°F | Resp 20 | Wt 155.2 lb

## 2022-05-13 DIAGNOSIS — C50512 Malignant neoplasm of lower-outer quadrant of left female breast: Secondary | ICD-10-CM | POA: Diagnosis not present

## 2022-05-13 DIAGNOSIS — Z7951 Long term (current) use of inhaled steroids: Secondary | ICD-10-CM | POA: Insufficient documentation

## 2022-05-13 DIAGNOSIS — Z79899 Other long term (current) drug therapy: Secondary | ICD-10-CM | POA: Diagnosis not present

## 2022-05-13 DIAGNOSIS — Z803 Family history of malignant neoplasm of breast: Secondary | ICD-10-CM | POA: Diagnosis not present

## 2022-05-13 DIAGNOSIS — Z170 Estrogen receptor positive status [ER+]: Secondary | ICD-10-CM

## 2022-05-13 DIAGNOSIS — M129 Arthropathy, unspecified: Secondary | ICD-10-CM | POA: Diagnosis not present

## 2022-05-13 DIAGNOSIS — I341 Nonrheumatic mitral (valve) prolapse: Secondary | ICD-10-CM | POA: Insufficient documentation

## 2022-05-13 NOTE — Telephone Encounter (Signed)
I called patient and LM advising sending referral to Dr Shon Baton.  Referral entered.

## 2022-05-14 ENCOUNTER — Other Ambulatory Visit: Payer: Self-pay

## 2022-05-14 ENCOUNTER — Other Ambulatory Visit: Payer: Self-pay | Admitting: Radiation Oncology

## 2022-05-14 ENCOUNTER — Encounter: Payer: Self-pay | Admitting: Physical Therapy

## 2022-05-14 ENCOUNTER — Ambulatory Visit: Payer: BC Managed Care – PPO | Attending: General Surgery | Admitting: Physical Therapy

## 2022-05-14 DIAGNOSIS — Z170 Estrogen receptor positive status [ER+]: Secondary | ICD-10-CM | POA: Diagnosis not present

## 2022-05-14 DIAGNOSIS — R293 Abnormal posture: Secondary | ICD-10-CM | POA: Diagnosis not present

## 2022-05-14 DIAGNOSIS — C50512 Malignant neoplasm of lower-outer quadrant of left female breast: Secondary | ICD-10-CM | POA: Insufficient documentation

## 2022-05-14 DIAGNOSIS — M25512 Pain in left shoulder: Secondary | ICD-10-CM

## 2022-05-14 DIAGNOSIS — M25612 Stiffness of left shoulder, not elsewhere classified: Secondary | ICD-10-CM | POA: Insufficient documentation

## 2022-05-14 NOTE — Therapy (Signed)
OUTPATIENT PHYSICAL THERAPY BREAST CANCER BASELINE EVALUATION   Patient Name: Mariah Burnett MRN: [id number] DOB:1975/12/26, 47 y.o., female Today's Date: 05/14/2022  END OF SESSION:  PT End of Session - 05/14/22 2003     Visit Number 1    Number of Visits 9    Date for PT Re-Evaluation 06/11/22    PT Start Time 1502    PT Stop Time 1603    PT Time Calculation (min) 61 min    Activity Tolerance Patient tolerated treatment well    Behavior During Therapy [facility] for tasks assessed/performed             Past Medical History:  Diagnosis Date   Arthritis    Asthma    Breast cancer (HCC) 04/24/2022   MVP (mitral valve prolapse)    Although no evidence per echo in 2009. Trace MR   Palpitation    Scoliosis    Urticaria    Vaginal Pap smear, abnormal    Past Surgical History:  Procedure Laterality Date   BREAST BIOPSY Left 04/24/2022   [place] LT BREAST BX W LOC DEV 1ST LESION IMG BX SPEC US GUIDE 04/24/2022 AP-ULTRASOUND   BREAST BIOPSY Left 04/24/2022   [place] LT BREAST BX W LOC DEV EA ADD LESION IMG BX SPEC US GUIDE 04/24/2022 AP-ULTRASOUND   KIDNEY STONE SURGERY Bilateral 2000   WISDOM TOOTH EXTRACTION Bilateral 1991   Patient Active Problem List   Diagnosis Date Noted   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 (HCC) 05/12/2022   Chest tightness 03/04/2022   Family history of coronary artery disease in mother 03/04/2022   Hyperlipidemia 10/17/2021   Encounter to establish care 10/17/2021   Biceps tendinitis, left 10/17/2021   Mild intermittent asthma without complication 01/14/2021   Seasonal and perennial allergic rhinitis 01/14/2021   Dizziness 04/23/2020   Abnormal auditory perception of both ears 04/23/2020   Heart murmur 03/17/2018   Asthma 03/17/2018   Shortness of breath 09/25/2010   Palpitation    MVP (mitral valve prolapse)    Scoliosis     PCP: Billie Lade, MD  REFERRING PROVIDER: Griselda Miner, MD  REFERRING DIAG: [phone] (ICD-10-CM) -  Malignant neoplasm of lower-outer quadrant of left female breast Z17.0 (ICD-10-CM) - Estrogen receptor positive status (ER+)  THERAPY DIAG:  Stiffness of left shoulder, not elsewhere classified  Acute pain of left shoulder  Abnormal posture  Malignant neoplasm of lower-outer quadrant of left breast of female, estrogen receptor positive (HCC)  Rationale for Evaluation and Treatment: Rehabilitation  ONSET DATE: 04/24/22  SUBJECTIVE:  SUBJECTIVE STATEMENT: Patient reports she is here today to be seen by her medical team for her newly diagnosed left breast cancer. 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 I have been getting PT. I had tennis elbow and the cortisone injection helped that but then I got frozen shoulder on the L. I can't dress. I can't sleep good. I wake up all night long in pain. I see a new orthopedic doctor tomorrow.   PERTINENT HISTORY:  Patient was diagnosed on 04/24/22 with left grade 2. It measures 1.2 cm and is located in the lower-outer quadrant. It is ER/PR+, HER2- with a Ki67 of 10%.   PATIENT GOALS:   reduce lymphedema risk and learn post op HEP.   PAIN:  Are you having pain? Yes: NPRS scale: 4/10 Pain location: L shoulder and upper arm Pain description: tight, catching Aggravating factors: movement Relieving factors: ice  PRECAUTIONS: Active CA Other: L frozen shoulder  HAND DOMINANCE: right  WEIGHT BEARING RESTRICTIONS: No  FALLS:  Has patient fallen in last 6 months? No  LIVING ENVIRONMENT: Patient lives with: [name] - boyfriend - emergency Lives in: House/apartment Has following equipment at home: None  OCCUPATION: full time, does administrative work - Radiographer, therapeutic: was doing cardio dance, aerobics, zumba, barre - had  to stop after frozen shoulder  PRIOR LEVEL OF FUNCTION: Needs assistance with ADLs and Needs assistance with homemaking   OBJECTIVE:  COGNITION: Overall cognitive status: Within functional limits for tasks assessed    POSTURE:  Forward head and rounded shoulders posture  UPPER EXTREMITY AROM/PROM:  A/PROM RIGHT   eval 166  Shoulder extension 66  Shoulder flexion 166  Shoulder abduction 176  Shoulder internal rotation 59  Shoulder external rotation 95    (Blank rows = not tested)  A/PROM LEFT   eval  Shoulder extension 41  Shoulder flexion 118  Shoulder abduction 149  Shoulder internal rotation Unable due to P!  Shoulder external rotation Unable due to P!    (Blank rows = not tested)  CERVICAL AROM: All within normal limits:    Percent limited  Flexion WFL  Extension WFL  Right lateral flexion WFL  Left lateral flexion WFL  Right rotation WFL  Left rotation WFL    UPPER EXTREMITY STRENGTH: did not assess due to pain  LYMPHEDEMA ASSESSMENTS:   LANDMARK RIGHT   eval  10 cm proximal to olecranon process 27.2  Olecranon process 24.1  10 cm proximal to ulnar styloid process 20.7  Just proximal to ulnar styloid process 15  Across hand at thumb web space 18.5  At base of 2nd digit 5.5  (Blank rows = not tested)  LANDMARK LEFT   eval  10 cm proximal to olecranon process 27  Olecranon process 25  10 cm proximal to ulnar styloid process 20.2  Just proximal to ulnar styloid process 15  Across hand at thumb web space 18.2  At base of 2nd digit 5.5  (Blank rows = not tested)  L-DEX LYMPHEDEMA SCREENING:  The patient was assessed using the L-Dex machine today to produce a lymphedema index baseline score. The patient will be reassessed on a regular basis (typically every 3 months) to obtain new L-Dex scores. If the score is > 6.5 points away from his/her baseline score indicating onset of subclinical lymphedema, it will be recommended to wear a compression  garment for 4 weeks, 12 hours per day and then be reassessed. If the score  continues to be > 6.5 points from baseline at reassessment, we will initiate lymphedema treatment. Assessing in this manner has a 95% rate of preventing clinically significant lymphedema.   L-DEX FLOWSHEETS - 05/14/22 2000       L-DEX LYMPHEDEMA SCREENING   Measurement Type Unilateral    L-DEX MEASUREMENT EXTREMITY Upper Extremity    POSITION  Standing    DOMINANT SIDE Right    At Risk Side Left    BASELINE SCORE (UNILATERAL) 0.6             QUICK DASH SURVEY:  Neldon Mc - 05/14/22 0001     Open a tight or new jar Mild difficulty    Do heavy household chores (wash walls, wash floors) Moderate difficulty    Carry a shopping bag or briefcase Mild difficulty    Wash your back Severe difficulty    Use a knife to cut food Mild difficulty    Recreational activities in which you take some force or impact through your arm, shoulder, or hand (golf, hammering, tennis) Unable    During the past week, to what extent has your arm, shoulder or hand problem interfered with your normal social activities with family, friends, neighbors, or groups? Quite a bit    During the past week, to what extent has your arm, shoulder or hand problem limited your work or other regular daily activities Modererately    Arm, shoulder, or hand pain. Severe    Tingling (pins and needles) in your arm, shoulder, or hand Moderate    Difficulty Sleeping So much difficuSo much difficulty, I can't sleep    DASH Score 59.09 %              PATIENT EDUCATION:  Education details: Lymphedema risk reduction and post op shoulder/posture HEP Person educated: Patient Education method: Explanation, Demonstration, Handout Education comprehension: Patient verbalized understanding and returned demonstration  HOME EXERCISE PROGRAM: Patient was instructed today in a home exercise program today for post op shoulder range of motion. These included  active assist shoulder flexion in sitting, scapular retraction, wall walking with shoulder abduction, and hands behind head external rotation.  She was encouraged to do these twice a day, holding 3 seconds and repeating 5 times when permitted by her physician. Educated pt to only do what she is able to due to her frozen shoulder and not to push in to pain.    ASSESSMENT:  CLINICAL IMPRESSION: Pt reports to PT with recently diagnosed L breast cancer. The plan is to undergo a L lumpectomy and targeted node dissection. At baseline her L shoulder ROM is very limited. She reports she has a frozen shoulder which began in November of 2023 following issues with L tennis elbow. Her tennis elbow improved after an injection but her shoulder has been worsening since. She has only had an x-ray done. She has not had any PT other than at home exercises given to her by her ortho doctor. She was referred to a different ortho dcotor that she will see tomorrow. Pt to find out if she is cleared to begin PT prior to her surgery to help improve L shoulder ROM and decrease pain. Pending approval from her ortho doctor pt would benefit from skilled PT services to improve L shoulder ROM and decrease pain and from a post op assessment to determine needs at that time and from L-Dex screens every 3 months for 2 years to detect subclinical lymphedema.  Pt will benefit from skilled therapeutic intervention  to improve on the following deficits: Decreased knowledge of precautions, impaired UE functional use, pain, decreased ROM, postural dysfunction.   PT treatment/interventions: ADL/self-care home management, pt/family education, therapeutic exercise, manual therapy, joint mobilization  REHAB POTENTIAL: Good  CLINICAL DECISION MAKING: Evolving/moderate complexity  EVALUATION COMPLEXITY: Moderate   GOALS: Goals reviewed with patient? YES  LONG TERM GOALS: (STG=LTG)    Name Target Date Goal status  1 Pt will be able to  verbalize understanding of pertinent lymphedema risk reduction practices relevant to her dx specifically related to skin care.  Baseline:  No knowledge 05/14/2022 Achieved at eval  2 Pt will be able to return demo and/or verbalize understanding of the post op HEP related to regaining shoulder ROM. Baseline:  No knowledge 05/14/2022 Achieved at eval  3 Pt will be able to verbalize understanding of the importance of attending the post op After Breast CA Class for further lymphedema risk reduction education and therapeutic exercise.  Baseline:  No knowledge 05/14/2022 Achieved at eval  4 Pt will demo she has regained full shoulder ROM and function post operatively compared to baselines.  Baseline: See objective measurements taken today. 06/11/22 NEW  5   Pt will demonstrate 150 degrees of L shoulder flexion to allow her to reach overhead. 06/11/22 NEW  6 Pt will demonstrate 150 degrees of L shoulder abduction to allow her to reach out to the side. 06/11/22 NEW   7 Pt will be independent in a home exercise program for continued stretching and strengthening.  06/11/22 NEW   8 Pt will report a 50% improvement in L shoulder pain to allow pt to demonstrate increased independence in ADLs. 06/11/22 NEW    PLAN:  PT FREQUENCY/DURATION: 2x/wk for 4 wks   PLAN FOR NEXT SESSION: begin ROM and joint mobs to L shoulder to improve ROM prior to surgery pending approval from ortho dr, have pt return to PT 2 wks after surgery   Patient will follow up at outpatient cancer rehab 3-4 weeks following surgery.  If the patient requires physical therapy at that time, a specific plan will be dictated and sent to the referring physician for approval. The patient was educated today on appropriate basic range of motion exercises to begin post operatively and the importance of attending the After Breast Cancer class following surgery.  Patient was educated today on lymphedema risk reduction practices as it pertains to recommendations  that will benefit the patient immediately following surgery.  She verbalized good understanding.    Physical Therapy Information for After Breast Cancer Surgery/Treatment:  Lymphedema is a swelling condition that you may be at risk for in your arm if you have lymph nodes removed from the armpit area.  After a sentinel node biopsy, the risk is approximately 5-9% and is higher after an axillary node dissection.  There is treatment available for this condition and it is not life-threatening.  Contact your physician or physical therapist with concerns. You may begin the 4 shoulder/posture exercises (see additional sheet) when permitted by your physician (typically a week after surgery).  If you have drains, you may need to wait until those are removed before beginning range of motion exercises.  A general recommendation is to not lift your arms above shoulder height until drains are removed.  These exercises should be done to your tolerance and gently.  This is not a "no pain/no gain" type of recovery so listen to your body and stretch into the range of motion that you can tolerate, stopping  if you have pain.  If you are having immediate reconstruction, ask your plastic surgeon about doing exercises as he or she may want you to wait. We encourage you to attend the free one time ABC (After Breast Cancer) class offered by [facility].  You will learn information related to lymphedema risk, prevention and treatment and additional exercises to regain mobility following surgery.  You can call [phone] for more information.  This is offered the 1st and 3rd Monday of each month.  You only attend the class one time. While undergoing any medical procedure or treatment, try to avoid blood pressure being taken or needle sticks from occurring on the arm on the side of cancer.   This recommendation begins after surgery and continues for the rest of your life.  This may help reduce your risk of  getting lymphedema (swelling in your arm). An excellent resource for those seeking information on lymphedema is the National Lymphedema Network's web site. It can be accessed at www.lymphnet.org If you notice swelling in your hand, arm or breast at any time following surgery (even if it is many years from now), please contact your doctor or physical therapist to discuss this.  Lymphedema can be treated at any time but it is easier for you if it is treated early on.  If you feel like your shoulder motion is not returning to normal in a reasonable amount of time, please contact your surgeon or physical therapist.  [facility] [phone]. [address], [address], [place] [address]  ABC CLASS After Breast Cancer Class  After Breast Cancer Class is a specially designed exercise class to assist you in a safe recover after having breast cancer surgery.  In this class you will learn how to get back to full function whether your drains were just removed or if you had surgery a month ago.  This one-time class is held the 1st and 3rd Monday of every month from 11:00 a.m. until 12:00 noon virtually.  This class is FREE and space is limited. For more information or to register for the next available class, call [phone].  Class Goals  Understand specific stretches to improve the flexibility of you chest and shoulder. Learn ways to safely strengthen your upper body and improve your posture. Understand the warning signs of infection and why you may be at risk for an arm infection. Learn about Lymphedema and prevention.  ** You do not attend this class until after surgery.  Drains must be removed to participate  Patient was instructed today in a home exercise program today for post op shoulder range of motion. These included active assist shoulder flexion in sitting, scapular retraction, wall walking with shoulder abduction, and hands behind head external  rotation.  She was encouraged to do these twice a day, holding 3 seconds and repeating 5 times when permitted by her physician.    [facility], PT 05/14/2022, 8:06 PM

## 2022-05-15 ENCOUNTER — Other Ambulatory Visit: Payer: Self-pay

## 2022-05-15 ENCOUNTER — Encounter: Payer: Self-pay | Admitting: Radiation Oncology

## 2022-05-15 ENCOUNTER — Ambulatory Visit: Payer: BC Managed Care – PPO | Admitting: Sports Medicine

## 2022-05-15 ENCOUNTER — Encounter: Payer: Self-pay | Admitting: Physical Therapy

## 2022-05-15 ENCOUNTER — Encounter: Payer: Self-pay | Admitting: Sports Medicine

## 2022-05-15 DIAGNOSIS — G8929 Other chronic pain: Secondary | ICD-10-CM

## 2022-05-15 DIAGNOSIS — M7502 Adhesive capsulitis of left shoulder: Secondary | ICD-10-CM | POA: Diagnosis not present

## 2022-05-15 DIAGNOSIS — M25512 Pain in left shoulder: Secondary | ICD-10-CM | POA: Diagnosis not present

## 2022-05-15 MED ORDER — LIDOCAINE HCL 1 % IJ SOLN
4.0000 mL | INTRAMUSCULAR | Status: AC | PRN
Start: 2022-05-15 — End: 2022-05-15
  Administered 2022-05-15: 4 mL

## 2022-05-15 MED ORDER — BUPIVACAINE HCL 0.25 % IJ SOLN
4.0000 mL | INTRAMUSCULAR | Status: AC | PRN
Start: 2022-05-15 — End: 2022-05-15
  Administered 2022-05-15: 4 mL via INTRA_ARTICULAR

## 2022-05-15 MED ORDER — METHYLPREDNISOLONE ACETATE 40 MG/ML IJ SUSP
80.0000 mg | INTRAMUSCULAR | Status: AC | PRN
Start: 2022-05-15 — End: 2022-05-15
  Administered 2022-05-15: 80 mg via INTRA_ARTICULAR

## 2022-05-15 NOTE — Progress Notes (Signed)
Left shoulder; frozen shoulder Pain since January  Pain started in left elbow; did PT and injection and got better  Pain moved to shoulder; Dr. Dallas Schimke did a "fluid" injection to help "loosen" shoulder to do HEP  Pain is worse Shoulder is tight

## 2022-05-15 NOTE — Progress Notes (Signed)
I called and updated the patient but had to leave her voicemail in order to do so from the discussion from breast oncology conference yesterday.  She will meet with medical oncology tomorrow to discuss further.

## 2022-05-15 NOTE — Progress Notes (Signed)
Mariah Burnett - 47 y.o. female MRN [id number]  Date of birth: 1975/09/03  Office Visit Note: Visit Date: 05/15/2022 PCP: Billie Lade, MD Referred by: Oliver Barre, MD  Subjective: Chief Complaint  Patient presents with   Left Shoulder - Pain   HPI: Mariah Burnett is a pleasant 47 y.o. female who presents today for follow-up of chronic adhesive capsulitis, left shoulder.  She has been dealing with rather significant pain as well as restriction in range of motion for the last 6 months.  She previously saw one of my colleagues, Dr. Thane Edu, who diagnosed her with likely adhesive capsulitis.  She did undergo a glenohumeral joint injection of 10 cc.  Had some mild benefit but is still dealing with significant pain and restriction in range of motion.  She had an evaluation with a physical therapist and has her first appointment upcoming in about 2 weeks.  She has been doing some of the home exercises for frozen shoulder that Dr. Dallas Schimke provided for her. Does have pain/soreness with this.  Uses ice and Advil otherwise.  She is not diabetic, does not have thyroid issues.  Of note, she tells me today she was recently diagnosed with breast cancer in the left breast, has an appointment with medical oncology to discuss next steps tomorrow.  Pertinent ROS were reviewed with the patient and found to be negative unless otherwise specified above in HPI.   Assessment & Plan: Visit Diagnoses:  1. Adhesive capsulitis of left shoulder   2. Chronic left shoulder pain    Plan: Discussed with Mariah Burnett that her exam is indicative of adhesive capsulitis.  She has been doing some home therapy but no formalized physical therapy yet to date.  She does seem to be in the 'frozen stage' of the course.  Through shared decision-making, elected just proceed with high-volume glenohumeral joint injection under ultrasound guidance.  We were able to distend the capsule without rupture.  I would like to see her back in  about 2 weeks for reevaluation.  If she is not 80% better or more, may consider 1 additional high-volume injection (goal between 25-30 cc's).  I do want her to continue her home therapy to work on stretching out the capsule, she will also get started in formalized physical therapy in the meantime.  May take over-the-counter anti-inflammatories for pain control.  Follow-up: Return in about 2 weeks (around 05/29/2022) for for left shoulder f/u (30-mins for poss US-inj).   Meds & Orders: No orders of the defined types were placed in this encounter.   Orders Placed This Encounter  Procedures   Large Joint Inj   US Guided Needle Placement - No Linked Charges     Procedures: Large Joint Inj: L glenohumeral on 05/15/2022 5:32 PM Indications: pain Details: 22 G 3.5 in needle, ultrasound-guided posterior approach Medications: 4 mL lidocaine 1 %; 4 mL bupivacaine 0.25 %; 80 mg methylPREDNISolone acetate 40 MG/ML (10 mL of dextrose 5%) Outcome: tolerated well, no immediate complications  US-guided glenohumeral large-volume joint injection, left shoulder After discussion on risks/benefits/indications, informed verbal consent was obtained. A timeout was then performed. The patient was positioned lying lateral recumbent on examination table. The patient's shoulder was prepped with betadine and multiple alcohol swabs and utilizing ultrasound guidance, the patient's glenohumeral joint was identified on ultrasound. Using ultrasound guidance a 22-gauge, 3.5 inch needle with a mixture of 4:4:2:10 cc's lidocaine:bupivicaine:depomedrol:dextrose 5% (total 20 cc) was directed from a lateral to medial direction via in-plane technique  into the glenohumeral joint with visualization of appropriate spread of injectate into the joint. Patient tolerated the procedure well without immediate complications.      Procedure, treatment alternatives, risks and benefits explained, specific risks discussed. Consent was given by the  patient. Immediately prior to procedure a time out was called to verify the correct patient, procedure, equipment, support staff and site/side marked as required. Patient was prepped and draped in the usual sterile fashion.          Clinical History: No specialty comments available.  She reports that she has never smoked. She has never used smokeless tobacco. No results for input(s): "HGBA1C", "LABURIC" in the last 8760 hours.  Objective:   Vital Signs: LMP 04/15/2022   Physical Exam  Gen: Well-appearing, in no acute distress; non-toxic CV: Well-perfused. Warm.  Resp: Breathing unlabored on room air; no wheezing. Psych: Fluid speech in conversation; appropriate affect; normal thought process Neuro: Sensation intact throughout. No gross coordination deficits.   Ortho Exam - Left shoulder: No specific bony TTP.  No swelling or redness. There is full strength testing of the rotator cuff. There is restriction in both active and passive range of motion of the shoulder with forward flexion to 155 degrees, abduction 130 degrees, external rotation 15 degrees, internal rotation with thumb to L4.  Imaging:  *Independent review and interpretation of the left shoulder x-rays from 04/09/2022 show a well-maintained glenohumeral joint space without evidence of arthritis.  No acute AC joint or other bony abnormality.  Narrative & Impression  X-rays of the left shoulder were obtained in clinic today.  No acute injuries noted.  Well-maintained joint space within the glenohumeral joint.  Glenohumeral joint is reduced.  No evidence of proximal humeral migration.  No bony lesions.   Impression: Negative left shoulder x-ray    Past Medical/Family/Surgical/Social History: Medications & Allergies reviewed per EMR, new medications updated. Patient Active Problem List   Diagnosis Date Noted   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 (HCC) 05/12/2022   Chest tightness 03/04/2022   Family  history of coronary artery disease in mother 03/04/2022   Hyperlipidemia 10/17/2021   Encounter to establish care 10/17/2021   Biceps tendinitis, left 10/17/2021   Mild intermittent asthma without complication 01/14/2021   Seasonal and perennial allergic rhinitis 01/14/2021   Dizziness 04/23/2020   Abnormal auditory perception of both ears 04/23/2020   Heart murmur 03/17/2018   Asthma 03/17/2018   Shortness of breath 09/25/2010   Palpitation    MVP (mitral valve prolapse)    Scoliosis    Past Medical History:  Diagnosis Date   Arthritis    Asthma    Breast cancer (HCC) 04/24/2022   MVP (mitral valve prolapse)    Although no evidence per echo in 2009. Trace MR   Palpitation    Scoliosis    Urticaria    Vaginal Pap smear, abnormal    Family History  Problem Relation Age of Onset   Heart disease Mother    Heart attack Mother    Breast cancer Mother    Urticaria Mother    Cancer Father    Renal cancer Father    Breast cancer Paternal Grandmother    Cancer Paternal Grandfather    Emphysema Paternal Grandfather    Breast cancer Maternal Aunt    Breast cancer Maternal Aunt    Past Surgical History:  Procedure Laterality Date   BREAST BIOPSY Left 04/24/2022   [place] LT BREAST BX W LOC DEV 1ST LESION  IMG BX SPEC US GUIDE 04/24/2022 AP-ULTRASOUND   BREAST BIOPSY Left 04/24/2022   [place] LT BREAST BX W LOC DEV EA ADD LESION IMG BX SPEC US GUIDE 04/24/2022 AP-ULTRASOUND   KIDNEY STONE SURGERY Bilateral 2000   WISDOM TOOTH EXTRACTION Bilateral 1991   Social History   Occupational History   Not on file  Tobacco Use   Smoking status: Never   Smokeless tobacco: Never  Vaping Use   Vaping Use: Never used  Substance and Sexual Activity   Alcohol use: Yes    Comment: occ   Drug use: No   Sexual activity: Yes    Birth control/protection: None

## 2022-05-16 ENCOUNTER — Inpatient Hospital Stay: Payer: BC Managed Care – PPO

## 2022-05-16 ENCOUNTER — Other Ambulatory Visit: Payer: Self-pay

## 2022-05-16 ENCOUNTER — Encounter: Payer: Self-pay | Admitting: *Deleted

## 2022-05-16 ENCOUNTER — Inpatient Hospital Stay: Payer: BC Managed Care – PPO | Attending: Hematology and Oncology | Admitting: Hematology and Oncology

## 2022-05-16 VITALS — BP 128/87 | HR 86 | Temp 98.1°F | Resp 16 | Wt 155.7 lb

## 2022-05-16 DIAGNOSIS — C50512 Malignant neoplasm of lower-outer quadrant of left female breast: Secondary | ICD-10-CM | POA: Insufficient documentation

## 2022-05-16 DIAGNOSIS — Z170 Estrogen receptor positive status [ER+]: Secondary | ICD-10-CM | POA: Diagnosis not present

## 2022-05-16 NOTE — Assessment & Plan Note (Addendum)
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 After reviewing the pathology in detail, we proceeded to discuss the different treatment options between surgery, radiation, chemotherapy, antiestrogen therapies.  Treatment plan: Breast conserving surgery with targeted node dissection Oncotype DX testing if she is interested in offset clinical trial (randomization between complete ovarian function suppression versus chemotherapy plus complete ovarian function suppression) If not interested in the clinical trial then I recommend adjuvant chemotherapy with Taxotere Cytoxan every 3 weeks x 4 cycles  Return to clinic after surgery to discuss final pathology report.

## 2022-05-16 NOTE — Research (Unsigned)
Trial:  [id number]: A Phase III Adjuvant Trial Evaluating the Addition of Adjuvant Chemotherapy to Ovarian Function Suppression plus Endocrine Therapy in Premenopausal Patients with pN0-1, ER-Positive/HER2-Negative Breast Cancer and an Oncotype Recurrence Score ? 25 (OFSET)  Patient Mariah Burnett was identified by Dr. Pamelia Hoit as a potential candidate for the above listed study.  This Clinical Research Nurse met with Mariah Burnett, [id number], on 05/16/22 in a manner and location that ensures patient privacy to discuss participation in the above listed research study.  Patient is Accompanied by her significant other, [name] .  A copy of the informed consent document and separate HIPAA Authorization was provided to the patient.  Patient reads, speaks, and understands [place].   Patient was provided with the business card of this Nurse and encouraged to contact the research team with any questions.  Approximately 25 minutes were spent with the patient reviewing the informed consent documents.  Patient was provided the option of taking informed consent documents home to review and was encouraged to review at their convenience with their support network, including other care providers. Patient took the consent documents home to review.  Discussed with patient that we will not know if patient is eligible for this study until after her surgery and Oncotype results are available.  Research nurse will follow up with patient after results available. Encouraged patient to contact research nurse if any questions in the meantime. Patient verbalized understanding.  Domenica Reamer, BSN, RN, [employer] Clinical Research Nurse II [phone] 05/16/2022 2:07 PM

## 2022-05-16 NOTE — Progress Notes (Unsigned)
[facility] CONSULT NOTE  Patient Care Team: Billie Lade, MD as PCP - General (Internal Medicine)  CHIEF COMPLAINTS/PURPOSE OF CONSULTATION:  Newly diagnosed breast cancer  HISTORY OF PRESENTING ILLNESS:  Mariah Burnett 47 y.o. female is here because of recent diagnosis of left breast cancer.  Patient had a screening mammogram that detected a left breast mass at 4 o'clock position measuring 1.8 cm.  She had 1 abnormal lymph node in the axilla which was biopsy-proven to be positive.  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    I reviewed her records extensively and collaborated the history with the patient.  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 Trace MR   Palpitation    Scoliosis    Urticaria    Vaginal Pap smear, abnormal     SURGICAL HISTORY: Past Surgical History:  Procedure Laterality Date   BREAST BIOPSY Left 04/24/2022   [place] LT BREAST BX W LOC DEV 1ST LESION IMG BX SPEC US GUIDE  04/24/2022 [facility]-ULTRASOUND   BREAST BIOPSY Left 04/24/2022   [place] LT BREAST BX W LOC DEV EA ADD LESION IMG BX SPEC US GUIDE 04/24/2022 [facility]-ULTRASOUND   KIDNEY STONE SURGERY Bilateral 2000   WISDOM TOOTH EXTRACTION Bilateral 1991    SOCIAL HISTORY: Social History   Socioeconomic History   Marital status: Divorced    Spouse name: Not on file   Number of children: Not on file   Years of education: Not on file   Highest education level: Not on file  Occupational History   Not on file  Tobacco Use   Smoking status: Never   Smokeless tobacco: Never  Vaping Use   Vaping Use: Never used  Substance and Sexual Activity   Alcohol use: Yes    Comment: occ   Drug use: No   Sexual activity: Yes    Birth control/protection: None  Other Topics Concern   Not on file  Social History Narrative   Not on file   Social Determinants of Health   Financial Resource Strain: Low Risk  (03/05/2021)   Overall Financial Resource Strain (CARDIA)    Difficulty of Paying Living Expenses: Not very hard  Food Insecurity: Food Insecurity Present (03/05/2021)   Hunger Vital Sign    Worried About Running Out of Food in the Last Year: Sometimes true    Ran Out of Food in the Last Year: Sometimes true  Transportation Needs: No Transportation Needs (03/05/2021)  PRAPARE - Administrator, Civil Service (Medical): No    Lack of Transportation (Non-Medical): No  Physical Activity: Sufficiently Active (03/05/2021)   Exercise Vital Sign    Days of Exercise per Week: 7 days    Minutes of Exercise per Session: 60 min  Stress: No Stress Concern Present (03/05/2021)   [employer] of Occupational Health - Occupational Stress Questionnaire    Feeling of Stress : Not at all  Social Connections: Moderately Isolated (03/05/2021)   Social Connection and Isolation Panel [NHANES]    Frequency of Communication with Friends and Family: Once a week    Frequency of Social Gatherings with Friends and Family: Once a  week    Attends Religious Services: More than 4 times per year    Active Member of [employer] or Organizations: Yes    Attends Banker Meetings: More than 4 times per year    Marital Status: Separated  Intimate Partner Violence: Not At Risk (03/05/2021)   Humiliation, Afraid, Rape, and Kick questionnaire    Fear of Current or Ex-Partner: No    Emotionally Abused: No    Physically Abused: No    Sexually Abused: No    FAMILY HISTORY: Family History  Problem Relation Age of Onset   Heart disease Mother    Heart attack Mother    Breast cancer Mother    Urticaria Mother    Cancer Father    Renal cancer Father    Breast cancer Paternal Grandmother    Cancer Paternal Grandfather    Emphysema Paternal Grandfather    Breast cancer Maternal Aunt    Breast cancer Maternal Aunt     ALLERGIES:  is allergic to latex.  MEDICATIONS:  Current Outpatient Medications  Medication Sig Dispense Refill   acyclovir (ZOVIRAX) 200 MG capsule Take 400 mg by mouth 3 (three) times daily.     Azelastine HCl 137 MCG/SPRAY SOLN Place 2 sprays into the nose 2 (two) times daily as needed. 90 mL 4   desloratadine (CLARINEX) 5 MG tablet Take one tablet once daily as needed for runny nose or itchy eyes. 90 tablet 1   hydrOXYzine (ATARAX) 25 MG tablet Take 25 mg by mouth every 8 (eight) hours as needed.     levalbuterol (XOPENEX HFA) 45 MCG/ACT inhaler Inhale 2 puffs into the lungs every 6 (six) hours as needed for wheezing. 15 g 1   meloxicam (MOBIC) 15 MG tablet Take 1 tablet (15 mg total) by mouth daily as needed. Take 15 mg by mouth daily as needed. 30 tablet 0   metoprolol tartrate (LOPRESSOR) 25 MG tablet TAKE 1/2 TABLET TWICE A DAY 90 tablet 3   mometasone (ASMANEX, 120 METERED DOSES,) 220 MCG/ACT inhaler Inhale 2 puffs into the lungs daily. (Patient not taking: Reported on 03/07/2022) 3 each 1   Multiple Vitamin (MULTIVITAMIN) tablet Take 1 tablet by mouth daily.     No current  facility-administered medications for this visit.    REVIEW OF SYSTEMS:   Constitutional: Denies fevers, chills or abnormal night sweats Breast:  Denies any palpable lumps or discharge All other systems were reviewed with the patient and are negative.  PHYSICAL EXAMINATION: ECOG PERFORMANCE STATUS: 1 - Symptomatic but completely ambulatory  Vitals:   05/16/22 1202  BP: 128/87  Pulse: 86  Resp: 16  Temp: 98.1 F (36.7 C)  SpO2: 100%   Filed Weights   05/16/22 1202  Weight: 155 lb 11.2 oz (70.6 kg)    GENERAL:alert,  no distress and comfortable    LABORATORY DATA:  I have reviewed the data as listed Lab Results  Component Value Date   WBC 5.4 04/05/2019   HGB 13.6 04/05/2019   HCT 40.2 04/05/2019   MCV 95.3 04/05/2019   PLT 202 04/05/2019   Lab Results  Component Value Date   NA 136 04/05/2019   K 3.4 (L) 04/05/2019   CL 103 04/05/2019   CO2 24 04/05/2019    RADIOGRAPHIC STUDIES: I have personally reviewed the radiological reports and agreed with the findings in the report.  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 After reviewing the pathology in detail, we proceeded to discuss the different treatment options between surgery, radiation, chemotherapy, antiestrogen therapies.  Treatment plan: Breast conserving surgery with targeted node dissection Oncotype DX testing if she is interested in offset clinical trial (randomization between complete ovarian function suppression versus  chemotherapy plus complete ovarian function suppression) If not interested in the clinical trial then I recommend adjuvant chemotherapy with Taxotere Cytoxan every 3 weeks x 4 cycles  Return to clinic after surgery to discuss final pathology report.  All questions were answered. The patient knows to call the clinic with any problems, questions or concerns.    Tamsen Meek, MD 05/19/22

## 2022-05-19 ENCOUNTER — Telehealth: Payer: Self-pay | Admitting: *Deleted

## 2022-05-19 NOTE — Telephone Encounter (Signed)
[id number]: A Phase III Adjuvant Trial Evaluating the Addition of Adjuvant Chemotherapy to Ovarian Function Suppression plus Endocrine Therapy in Premenopausal Patients with pN0-1, ER-Positive/HER2-Negative Breast Cancer and an Oncotype Recurrence Score ? 25 (OFSET)  Called patient to follow up on the above study and to clarify eligibility criteria regarding Oncotype score.  Informed patient that she looks eligible for this study if her Oncotype Recurrence score comes back as less than 26. Patient states understanding and she has read the consent form and confirmed this is the information she also read. Patient asks when she will hear from someone in our office about next steps and getting her surgery scheduled. She also wants to know when she would start Anastrozole and Zoladex after her surgery.  Before or after chemotherapy and radiation?  Informed patient that this research nurse will contact Dr. Pamelia Hoit and the Breast Nurse Navigators to have someone contact her about her questions. Encouraged patient to call research nurse if any research questions, otherwise we will plan to talk again after her Oncotype results are available. She verbalized understanding.  Domenica Reamer, BSN, RN, [employer] Research Nurse II [phone] 05/19/2022 12:50 PM

## 2022-05-20 ENCOUNTER — Encounter: Payer: Self-pay | Admitting: Hematology and Oncology

## 2022-05-20 ENCOUNTER — Encounter: Payer: Self-pay | Admitting: *Deleted

## 2022-05-20 ENCOUNTER — Telehealth: Payer: Self-pay | Admitting: *Deleted

## 2022-05-20 ENCOUNTER — Ambulatory Visit: Payer: Self-pay | Admitting: General Surgery

## 2022-05-20 DIAGNOSIS — Z171 Estrogen receptor negative status [ER-]: Secondary | ICD-10-CM

## 2022-05-20 MED ORDER — KETOROLAC TROMETHAMINE 15 MG/ML IJ SOLN
15.0000 mg | Freq: Once | INTRAMUSCULAR | Status: AC
Start: 2022-05-20 — End: 2022-05-25

## 2022-05-20 NOTE — Telephone Encounter (Signed)
Called pt, left vm requesting return call with questions or needs regarding dx or treatment care plan. Contact information provided.

## 2022-05-21 ENCOUNTER — Other Ambulatory Visit: Payer: Self-pay | Admitting: General Surgery

## 2022-05-21 DIAGNOSIS — Z171 Estrogen receptor negative status [ER-]: Secondary | ICD-10-CM

## 2022-05-22 ENCOUNTER — Encounter: Payer: Self-pay | Admitting: *Deleted

## 2022-05-22 ENCOUNTER — Telehealth: Payer: Self-pay | Admitting: *Deleted

## 2022-05-22 NOTE — Telephone Encounter (Signed)
Return pt call via number pt provided. Pt left vm concerned about sx date. Left vm to ensure pt received sx date with Dr. Alvester Morin information provided.

## 2022-05-23 ENCOUNTER — Telehealth: Payer: Self-pay | Admitting: Genetic Counselor

## 2022-05-27 ENCOUNTER — Ambulatory Visit: Payer: BC Managed Care – PPO

## 2022-05-27 DIAGNOSIS — M25512 Pain in left shoulder: Secondary | ICD-10-CM | POA: Diagnosis not present

## 2022-05-27 DIAGNOSIS — Z170 Estrogen receptor positive status [ER+]: Secondary | ICD-10-CM | POA: Diagnosis not present

## 2022-05-27 DIAGNOSIS — R293 Abnormal posture: Secondary | ICD-10-CM

## 2022-05-27 DIAGNOSIS — C50512 Malignant neoplasm of lower-outer quadrant of left female breast: Secondary | ICD-10-CM

## 2022-05-27 DIAGNOSIS — M25612 Stiffness of left shoulder, not elsewhere classified: Secondary | ICD-10-CM

## 2022-05-27 NOTE — Therapy (Signed)
OUTPATIENT PHYSICAL THERAPY BREAST CANCER TREATMENT   Patient Name: Mariah Burnett MRN: [id number] DOB:Feb 06, 1975, 47 y.o., female Today's Date: 05/27/2022  END OF SESSION:  PT End of Session - 05/27/22 1613     Visit Number 2    Number of Visits 9    Date for PT Re-Evaluation 06/11/22    PT Start Time 1613    PT Stop Time 1715    PT Time Calculation (min) 62 min    Activity Tolerance Patient tolerated treatment well    Behavior During Therapy [facility] for tasks assessed/performed             Past Medical History:  Diagnosis Date   Arthritis    Asthma    Breast cancer (HCC) 04/24/2022   MVP (mitral valve prolapse)    Although no evidence per echo in 2009. Trace MR   Palpitation    Scoliosis    Urticaria    Vaginal Pap smear, abnormal    Past Surgical History:  Procedure Laterality Date   BREAST BIOPSY Left 04/24/2022   [place] LT BREAST BX W LOC DEV 1ST LESION IMG BX SPEC US GUIDE 04/24/2022 [facility]-ULTRASOUND   BREAST BIOPSY Left 04/24/2022   [place] LT BREAST BX W LOC DEV EA ADD LESION IMG BX SPEC US GUIDE 04/24/2022 AP-ULTRASOUND   KIDNEY STONE SURGERY Bilateral 2000   WISDOM TOOTH EXTRACTION Bilateral 1991   Patient Active Problem List   Diagnosis Date Noted   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 (HCC) 05/12/2022   Chest tightness 03/04/2022   Family history of coronary artery disease in mother 03/04/2022   Hyperlipidemia 10/17/2021   Encounter to establish care 10/17/2021   Biceps tendinitis, left 10/17/2021   Mild intermittent asthma without complication 01/14/2021   Seasonal and perennial allergic rhinitis 01/14/2021   Dizziness 04/23/2020   Abnormal auditory perception of both ears 04/23/2020   Heart murmur 03/17/2018   Asthma 03/17/2018   Shortness of breath 09/25/2010   Palpitation    MVP (mitral valve prolapse)    Scoliosis     PCP: Billie Lade, MD  REFERRING PROVIDER: Griselda Miner, MD  REFERRING DIAG: [phone] (ICD-10-CM) -  Malignant neoplasm of lower-outer quadrant of left female breast Z17.0 (ICD-10-CM) - Estrogen receptor positive status (ER+)  THERAPY DIAG:  Stiffness of left shoulder, not elsewhere classified  Acute pain of left shoulder  Abnormal posture  Malignant neoplasm of lower-outer quadrant of left breast of female, estrogen receptor positive (HCC)  Rationale for Evaluation and Treatment: Rehabilitation  ONSET DATE: 04/24/22  SUBJECTIVE:  SUBJECTIVE STATEMENT: My shoulder was injected and it feels a lot better. It hurts with the exercises, but the pain is much better overall. I did start getting some pain down my lateral arm again. I see him again this week for another injection. I can sleep much better now.   05/14/2022 eval Patient reports she is here today to be seen by her medical team for her newly diagnosed left breast cancer. 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 I have been getting PT. I had tennis elbow and the cortisone injection helped that but then I got frozen shoulder on the L. I can't dress. I can't sleep good. I wake up all night long in pain. I see a new orthopedic doctor tomorrow.   PERTINENT HISTORY:  Patient was diagnosed on 04/24/22 with left grade 2. It measures 1.2 cm and is located in the lower-outer quadrant. It is ER/PR+, HER2- with a Ki67 of 10%.   PATIENT GOALS:   reduce lymphedema risk and learn post op HEP.   PAIN:  Are you having pain? Yes: NPRS scale: 4/10 Pain location: L shoulder and upper arm Pain description: tight, catching Aggravating factors: movement Relieving factors: ice  PRECAUTIONS: Active CA Other: L frozen shoulder  HAND DOMINANCE: right  WEIGHT BEARING RESTRICTIONS: No  FALLS:  Has patient fallen in last 6 months?  No  LIVING ENVIRONMENT: Patient lives with: [name] - boyfriend - emergency Lives in: House/apartment Has following equipment at home: None  OCCUPATION: full time, does administrative work - Radiographer, therapeutic: was doing cardio dance, aerobics, zumba, barre - had to stop after frozen shoulder  PRIOR LEVEL OF FUNCTION: Needs assistance with ADLs and Needs assistance with homemaking   OBJECTIVE:  COGNITION: Overall cognitive status: Within functional limits for tasks assessed    POSTURE:  Forward head and rounded shoulders posture  UPPER EXTREMITY AROM/PROM:  A/PROM RIGHT   eval 166  Shoulder extension 66  Shoulder flexion 166  Shoulder abduction 176  Shoulder internal rotation 59  Shoulder external rotation 95    (Blank rows = not tested)  A/PROM LEFT   eval  Shoulder extension 41  Shoulder flexion 118  Shoulder abduction 149  Shoulder internal rotation Unable due to P!  Shoulder external rotation Unable due to P!    (Blank rows = not tested)  CERVICAL AROM: All within normal limits:    Percent limited  Flexion WFL  Extension WFL  Right lateral flexion WFL  Left lateral flexion WFL  Right rotation WFL  Left rotation WFL    UPPER EXTREMITY STRENGTH: did not assess due to pain  LYMPHEDEMA ASSESSMENTS:   LANDMARK RIGHT   eval  10 cm proximal to olecranon process 27.2  Olecranon process 24.1  10 cm proximal to ulnar styloid process 20.7  Just proximal to ulnar styloid process 15  Across hand at thumb web space 18.5  At base of 2nd digit 5.5  (Blank rows = not tested)  LANDMARK LEFT   eval  10 cm proximal to olecranon process 27  Olecranon process 25  10 cm proximal to ulnar styloid process 20.2  Just proximal to ulnar styloid process 15  Across hand at thumb web space 18.2  At base of 2nd digit 5.5  (Blank rows = not tested)  L-DEX LYMPHEDEMA SCREENING:  The patient was assessed using the L-Dex machine today to produce a lymphedema  index baseline score. The patient will be reassessed on  a regular basis (typically every 3 months) to obtain new L-Dex scores. If the score is > 6.5 points away from his/her baseline score indicating onset of subclinical lymphedema, it will be recommended to wear a compression garment for 4 weeks, 12 hours per day and then be reassessed. If the score continues to be > 6.5 points from baseline at reassessment, we will initiate lymphedema treatment. Assessing in this manner has a 95% rate of preventing clinically significant lymphedema.     QUICK DASH SURVEY:  TREATMENT TODAY 05/27/2022 Messaged Dr. Carolynne Edouard at pt request to see if he is OK with her having another injection this Friday with Dr. Shon Baton GR 3 GH mobs post/inf., Scapular mobs pro/retraction/ elevation, depression Supine wand flexion and scaption x 5 with 10 sec holds, Reviewed sleeper stretch given by MD and showed how to do with ER as well Tried ER with wand but held secondary to pain PROM left shoulder with CR stretch for flexion, scaption, IR and ER to restore mobility. VC's to pt to depress scapula as needed Long arm distraction    PATIENT EDUCATION:  Education details: Lymphedema risk reduction and post op shoulder/posture HEP Person educated: Patient Education method: Explanation, Demonstration, Handout Education comprehension: Patient verbalized understanding and returned demonstration  HOME EXERCISE PROGRAM: Patient was instructed today in a home exercise program today for post op shoulder range of motion. These included active assist shoulder flexion in sitting, scapular retraction, wall walking with shoulder abduction, and hands behind head external rotation.  She was encouraged to do these twice a day, holding 3 seconds and repeating 5 times when permitted by her physician. Educated pt to only do what she is able to due to her frozen shoulder and not to push in to pain.    ASSESSMENT:  CLINICAL IMPRESSION: Pt has had  injections to the left shoulder by Dr. Shon Baton and was given exs to do including Supine wand, Sleeper stretch, pendulums, IR with towel by MD to do 2x/day.. She did well with exs today and had fairly good tolerance for contract relax stretching.     Pt will benefit from skilled therapeutic intervention to improve on the following deficits: Decreased knowledge of precautions, impaired UE functional use, pain, decreased ROM, postural dysfunction.   PT treatment/interventions: ADL/self-care home management, pt/family education, therapeutic exercise, manual therapy, joint mobilization  REHAB POTENTIAL: Good  CLINICAL DECISION MAKING: Evolving/moderate complexity  EVALUATION COMPLEXITY: Moderate   GOALS: Goals reviewed with patient? YES  LONG TERM GOALS: (STG=LTG)    Name Target Date Goal status  1 Pt will be able to verbalize understanding of pertinent lymphedema risk reduction practices relevant to her dx specifically related to skin care.  Baseline:  No knowledge 05/14/2022 Achieved at eval  2 Pt will be able to return demo and/or verbalize understanding of the post op HEP related to regaining shoulder ROM. Baseline:  No knowledge 05/14/2022 Achieved at eval  3 Pt will be able to verbalize understanding of the importance of attending the post op After Breast CA Class for further lymphedema risk reduction education and therapeutic exercise.  Baseline:  No knowledge 05/14/2022 Achieved at eval  4 Pt will demo she has regained full shoulder ROM and function post operatively compared to baselines.  Baseline: See objective measurements taken today. 06/11/22 NEW  5   Pt will demonstrate 150 degrees of L shoulder flexion to allow her to reach overhead. 06/11/22 NEW  6 Pt will demonstrate 150 degrees of L shoulder abduction to allow her  to reach out to the side. 06/11/22 NEW   7 Pt will be independent in a home exercise program for continued stretching and strengthening.  06/11/22 NEW   8 Pt will report a  50% improvement in L shoulder pain to allow pt to demonstrate increased independence in ADLs. 06/11/22 NEW    PLAN:  PT FREQUENCY/DURATION: 2x/wk for 4 wks   PLAN FOR NEXT SESSION: begin ROM and joint mobs to L shoulder to improve ROM prior to surgery pending approval from ortho dr, have pt return to PT 2 wks after surgery. ( Rescheduled return date to 6/14 for post op)   Patient will follow up at outpatient cancer rehab 3-4 weeks following surgery.  If the patient requires physical therapy at that time, a specific plan will be dictated and sent to the referring physician for approval. The patient was educated today on appropriate basic range of motion exercises to begin post operatively and the importance of attending the After Breast Cancer class following surgery.  Patient was educated today on lymphedema risk reduction practices as it pertains to recommendations that will benefit the patient immediately following surgery.  She verbalized good understanding.    Physical Therapy Information for After Breast Cancer Surgery/Treatment:  Lymphedema is a swelling condition that you may be at risk for in your arm if you have lymph nodes removed from the armpit area.  After a sentinel node biopsy, the risk is approximately 5-9% and is higher after an axillary node dissection.  There is treatment available for this condition and it is not life-threatening.  Contact your physician or physical therapist with concerns. You may begin the 4 shoulder/posture exercises (see additional sheet) when permitted by your physician (typically a week after surgery).  If you have drains, you may need to wait until those are removed before beginning range of motion exercises.  A general recommendation is to not lift your arms above shoulder height until drains are removed.  These exercises should be done to your tolerance and gently.  This is not a "no pain/no gain" type of recovery so listen to your body and stretch into the  range of motion that you can tolerate, stopping if you have pain.  If you are having immediate reconstruction, ask your plastic surgeon about doing exercises as he or she may want you to wait. We encourage you to attend the free one time ABC (After Breast Cancer) class offered by [facility].  You will learn information related to lymphedema risk, prevention and treatment and additional exercises to regain mobility following surgery.  You can call [phone] for more information.  This is offered the 1st and 3rd Monday of each month.  You only attend the class one time. While undergoing any medical procedure or treatment, try to avoid blood pressure being taken or needle sticks from occurring on the arm on the side of cancer.   This recommendation begins after surgery and continues for the rest of your life.  This may help reduce your risk of getting lymphedema (swelling in your arm). An excellent resource for those seeking information on lymphedema is the National Lymphedema Network's web site. It can be accessed at www.lymphnet.org If you notice swelling in your hand, arm or breast at any time following surgery (even if it is many years from now), please contact your doctor or physical therapist to discuss this.  Lymphedema can be treated at any time but it is easier for you if it is  treated early on.  If you feel like your shoulder motion is not returning to normal in a reasonable amount of time, please contact your surgeon or physical therapist.  [facility] [phone]. [address], [address], [place] [address]  ABC CLASS After Breast Cancer Class  After Breast Cancer Class is a specially designed exercise class to assist you in a safe recover after having breast cancer surgery.  In this class you will learn how to get back to full function whether your drains were just removed or if you had surgery a month ago.  This one-time class  is held the 1st and 3rd Monday of every month from 11:00 a.m. until 12:00 noon virtually.  This class is FREE and space is limited. For more information or to register for the next available class, call [phone].  Class Goals  Understand specific stretches to improve the flexibility of you chest and shoulder. Learn ways to safely strengthen your upper body and improve your posture. Understand the warning signs of infection and why you may be at risk for an arm infection. Learn about Lymphedema and prevention.  ** You do not attend this class until after surgery.  Drains must be removed to participate  Patient was instructed today in a home exercise program today for post op shoulder range of motion. These included active assist shoulder flexion in sitting, scapular retraction, wall walking with shoulder abduction, and hands behind head external rotation.  She was encouraged to do these twice a day, holding 3 seconds and repeating 5 times when permitted by her physician.    Waynette Buttery, PT 05/27/2022, 5:24 PM

## 2022-05-28 ENCOUNTER — Encounter (HOSPITAL_BASED_OUTPATIENT_CLINIC_OR_DEPARTMENT_OTHER): Payer: Self-pay | Admitting: General Surgery

## 2022-05-28 ENCOUNTER — Other Ambulatory Visit: Payer: Self-pay

## 2022-05-29 ENCOUNTER — Encounter: Payer: Self-pay | Admitting: Rehabilitation

## 2022-05-29 ENCOUNTER — Ambulatory Visit: Payer: BC Managed Care – PPO | Admitting: Rehabilitation

## 2022-05-29 DIAGNOSIS — M25512 Pain in left shoulder: Secondary | ICD-10-CM | POA: Diagnosis not present

## 2022-05-29 DIAGNOSIS — Z170 Estrogen receptor positive status [ER+]: Secondary | ICD-10-CM | POA: Diagnosis not present

## 2022-05-29 DIAGNOSIS — M25612 Stiffness of left shoulder, not elsewhere classified: Secondary | ICD-10-CM

## 2022-05-29 DIAGNOSIS — C50512 Malignant neoplasm of lower-outer quadrant of left female breast: Secondary | ICD-10-CM | POA: Diagnosis not present

## 2022-05-29 DIAGNOSIS — R293 Abnormal posture: Secondary | ICD-10-CM | POA: Diagnosis not present

## 2022-05-29 NOTE — Therapy (Signed)
OUTPATIENT PHYSICAL THERAPY BREAST CANCER TREATMENT   Patient Name: Mariah Burnett MRN: [id number] DOB:1975/05/21, 47 y.o., female Today's Date: 05/29/2022  END OF SESSION:  PT End of Session - 05/29/22 1707     Visit Number 3    Number of Visits 9    Date for PT Re-Evaluation 06/11/22    PT Start Time 1603    PT Stop Time 1650    PT Time Calculation (min) 47 min    Activity Tolerance Patient tolerated treatment well    Behavior During Therapy [facility] for tasks assessed/performed              Past Medical History:  Diagnosis Date   Arthritis    Asthma    Breast cancer (HCC) 04/24/2022   MVP (mitral valve prolapse)    Although no evidence per echo in 2009. Trace MR   Palpitation    Scoliosis    Urticaria    Vaginal Pap smear, abnormal    Past Surgical History:  Procedure Laterality Date   BREAST BIOPSY Left 04/24/2022   [place] LT BREAST BX W LOC DEV 1ST LESION IMG BX SPEC US GUIDE 04/24/2022 AP-ULTRASOUND   BREAST BIOPSY Left 04/24/2022   [place] LT BREAST BX W LOC DEV EA ADD LESION IMG BX SPEC US GUIDE 04/24/2022 AP-ULTRASOUND   KIDNEY STONE SURGERY Bilateral 2000   WISDOM TOOTH EXTRACTION Bilateral 1991   Patient Active Problem List   Diagnosis Date Noted   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 (HCC) 05/12/2022   Chest tightness 03/04/2022   Family history of coronary artery disease in mother 03/04/2022   Hyperlipidemia 10/17/2021   Encounter to establish care 10/17/2021   Biceps tendinitis, left 10/17/2021   Mild intermittent asthma without complication 01/14/2021   Seasonal and perennial allergic rhinitis 01/14/2021   Dizziness 04/23/2020   Abnormal auditory perception of both ears 04/23/2020   Heart murmur 03/17/2018   Asthma 03/17/2018   Shortness of breath 09/25/2010   Palpitation    MVP (mitral valve prolapse)    Scoliosis     PCP: Billie Lade, MD  REFERRING PROVIDER: Griselda Miner, MD  REFERRING DIAG: [phone] (ICD-10-CM) -  Malignant neoplasm of lower-outer quadrant of left female breast Z17.0 (ICD-10-CM) - Estrogen receptor positive status (ER+)  THERAPY DIAG:  Stiffness of left shoulder, not elsewhere classified  Acute pain of left shoulder  Abnormal posture  Malignant neoplasm of lower-outer quadrant of left breast of female, estrogen receptor positive (HCC)  Rationale for Evaluation and Treatment: Rehabilitation  ONSET DATE: 04/24/22  SUBJECTIVE:  SUBJECTIVE STATEMENT: It is acting up today   05/14/2022 eval Patient reports she is here today to be seen by her medical team for her newly diagnosed left breast cancer. 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 I have been getting PT. I had tennis elbow and the cortisone injection helped that but then I got frozen shoulder on the L. I can't dress. I can't sleep good. I wake up all night long in pain. I see a new orthopedic doctor tomorrow.   PERTINENT HISTORY:  Patient was diagnosed on 04/24/22 with left grade 2. It measures 1.2 cm and is located in the lower-outer quadrant. It is ER/PR+, HER2- with a Ki67 of 10%.   PATIENT GOALS:   reduce lymphedema risk and learn post op HEP.   PAIN:  Are you having pain? Yes: NPRS scale: 3/10 Pain location: L shoulder and upper arm Pain description: tight, catching Aggravating factors: movement Relieving factors: ice  PRECAUTIONS: Active CA Other: L frozen shoulder  HAND DOMINANCE: right  WEIGHT BEARING RESTRICTIONS: No  FALLS:  Has patient fallen in last 6 months? No  LIVING ENVIRONMENT: Patient lives with: [name] - boyfriend - emergency Lives in: House/apartment Has following equipment at home: None  OCCUPATION: full time, does administrative work - Radiographer, therapeutic: was doing  cardio dance, aerobics, zumba, barre - had to stop after frozen shoulder  PRIOR LEVEL OF FUNCTION: Needs assistance with ADLs and Needs assistance with homemaking   OBJECTIVE:  COGNITION: Overall cognitive status: Within functional limits for tasks assessed    POSTURE:  Forward head and rounded shoulders posture  UPPER EXTREMITY AROM/PROM:  A/PROM RIGHT   eval 166  Shoulder extension 66  Shoulder flexion 166  Shoulder abduction 176  Shoulder internal rotation 59  Shoulder external rotation 95    (Blank rows = not tested)  A/PROM LEFT   eval 05/29/22  Shoulder extension 41 45  Shoulder flexion 118 130  Shoulder abduction 149 149  Shoulder internal rotation Unable due to P! To L5  Shoulder external rotation Unable due to P! 65    (Blank rows = not tested)  UPPER EXTREMITY STRENGTH: did not assess due to pain  LYMPHEDEMA ASSESSMENTS:   LANDMARK RIGHT   eval  10 cm proximal to olecranon process 27.2  Olecranon process 24.1  10 cm proximal to ulnar styloid process 20.7  Just proximal to ulnar styloid process 15  Across hand at thumb web space 18.5  At base of 2nd digit 5.5  (Blank rows = not tested)  LANDMARK LEFT   eval  10 cm proximal to olecranon process 27  Olecranon process 25  10 cm proximal to ulnar styloid process 20.2  Just proximal to ulnar styloid process 15  Across hand at thumb web space 18.2  At base of 2nd digit 5.5  (Blank rows = not tested)   TREATMENT TODAY 05/29/22 Pulleys flexion and scaption x each with initial cueing Supine dowel flexion x 10 Sidelying Abd x 5 GH grade IV- mobilization AP and inferior and various angles of flexion and abduction Sidelying scapular mob PROM into flexion, abd, ER, IR in various angles of GH position to decrease pinching and pain STM Lt UT, subscap, latissimus with release in axilla of lat  05/27/2022 Messaged Dr. Carolynne Edouard at pt request to see if he is OK with her having another injection this Friday  with Dr. Shon Baton GR 3 [facility] mobs post/inf., Scapular mobs pro/retraction/  elevation, depression Supine wand flexion and scaption x 5 with 10 sec holds, Reviewed sleeper stretch given by MD and showed how to do with ER as well Tried ER with wand but held secondary to pain PROM left shoulder with CR stretch for flexion, scaption, IR and ER to restore mobility. VC's to pt to depress scapula as needed Long arm distraction   PATIENT EDUCATION:  Education details: Lymphedema risk reduction and post op shoulder/posture HEP Person educated: Patient Education method: Explanation, Demonstration, Handout Education comprehension: Patient verbalized understanding and returned demonstration  HOME EXERCISE PROGRAM: Patient was instructed today in a home exercise program today for post op shoulder range of motion. These included active assist shoulder flexion in sitting, scapular retraction, wall walking with shoulder abduction, and hands behind head external rotation.  She was encouraged to do these twice a day, holding 3 seconds and repeating 5 times when permitted by her physician. Educated pt to only do what she is able to due to her frozen shoulder and not to push in to pain.    ASSESSMENT:  CLINICAL IMPRESSION: Pt is increasing AROM and has less pain overall.  Still painful all motions end range and very limited into ER/IR.  We were able to get 1 more visit next week before surgery.   Pt will benefit from skilled therapeutic intervention to improve on the following deficits: Decreased knowledge of precautions, impaired UE functional use, pain, decreased ROM, postural dysfunction.   PT treatment/interventions: ADL/self-care home management, pt/family education, therapeutic exercise, manual therapy, joint mobilization  REHAB POTENTIAL: Good  CLINICAL DECISION MAKING: Evolving/moderate complexity  EVALUATION COMPLEXITY: Moderate   GOALS: Goals reviewed with patient? YES  LONG TERM GOALS:  (STG=LTG)    Name Target Date Goal status  1 Pt will be able to verbalize understanding of pertinent lymphedema risk reduction practices relevant to her dx specifically related to skin care.  Baseline:  No knowledge 05/14/2022 Achieved at eval  2 Pt will be able to return demo and/or verbalize understanding of the post op HEP related to regaining shoulder ROM. Baseline:  No knowledge 05/14/2022 Achieved at eval  3 Pt will be able to verbalize understanding of the importance of attending the post op After Breast CA Class for further lymphedema risk reduction education and therapeutic exercise.  Baseline:  No knowledge 05/14/2022 Achieved at eval  4 Pt will demo she has regained full shoulder ROM and function post operatively compared to baselines.  Baseline: See objective measurements taken today. 06/11/22 NEW  5   Pt will demonstrate 150 degrees of L shoulder flexion to allow her to reach overhead. 06/11/22 NEW  6 Pt will demonstrate 150 degrees of L shoulder abduction to allow her to reach out to the side. 06/11/22 NEW   7 Pt will be independent in a home exercise program for continued stretching and strengthening.  06/11/22 NEW   8 Pt will report a 50% improvement in L shoulder pain to allow pt to demonstrate increased independence in ADLs. 06/11/22 NEW    PLAN:  PT FREQUENCY/DURATION: 2x/wk for 4 wks   PLAN FOR NEXT SESSION: begin ROM and joint mobs to L shoulder to improve ROM prior to surgery pending approval from ortho dr, have pt return to PT 2 wks after surgery. ( Rescheduled return date to 6/14 for post op)   Patient will follow up at outpatient cancer rehab 3-4 weeks following surgery.  If the patient requires physical therapy at that time, a specific plan will be dictated  and sent to the referring physician for approval. The patient was educated today on appropriate basic range of motion exercises to begin post operatively and the importance of attending the After Breast Cancer class  following surgery.  Patient was educated today on lymphedema risk reduction practices as it pertains to recommendations that will benefit the patient immediately following surgery.  She verbalized good understanding.    Physical Therapy Information for After Breast Cancer Surgery/Treatment:  Lymphedema is a swelling condition that you may be at risk for in your arm if you have lymph nodes removed from the armpit area.  After a sentinel node biopsy, the risk is approximately 5-9% and is higher after an axillary node dissection.  There is treatment available for this condition and it is not life-threatening.  Contact your physician or physical therapist with concerns. You may begin the 4 shoulder/posture exercises (see additional sheet) when permitted by your physician (typically a week after surgery).  If you have drains, you may need to wait until those are removed before beginning range of motion exercises.  A general recommendation is to not lift your arms above shoulder height until drains are removed.  These exercises should be done to your tolerance and gently.  This is not a "no pain/no gain" type of recovery so listen to your body and stretch into the range of motion that you can tolerate, stopping if you have pain.  If you are having immediate reconstruction, ask your plastic surgeon about doing exercises as he or she may want you to wait. We encourage you to attend the free one time ABC (After Breast Cancer) class offered by [facility].  You will learn information related to lymphedema risk, prevention and treatment and additional exercises to regain mobility following surgery.  You can call [phone] for more information.  This is offered the 1st and 3rd Monday of each month.  You only attend the class one time. While undergoing any medical procedure or treatment, try to avoid blood pressure being taken or needle sticks from occurring on the arm on the side of cancer.    This recommendation begins after surgery and continues for the rest of your life.  This may help reduce your risk of getting lymphedema (swelling in your arm). An excellent resource for those seeking information on lymphedema is the National Lymphedema Network's web site. It can be accessed at www.lymphnet.org If you notice swelling in your hand, arm or breast at any time following surgery (even if it is many years from now), please contact your doctor or physical therapist to discuss this.  Lymphedema can be treated at any time but it is easier for you if it is treated early on.  If you feel like your shoulder motion is not returning to normal in a reasonable amount of time, please contact your surgeon or physical therapist.  [facility] [phone]. [address], [address], [place] [address]  ABC CLASS After Breast Cancer Class  After Breast Cancer Class is a specially designed exercise class to assist you in a safe recover after having breast cancer surgery.  In this class you will learn how to get back to full function whether your drains were just removed or if you had surgery a month ago.  This one-time class is held the 1st and 3rd Monday of every month from 11:00 a.m. until 12:00 noon virtually.  This class is FREE and space is limited. For more information  or to register for the next available class, call [phone].  Class Goals  Understand specific stretches to improve the flexibility of you chest and shoulder. Learn ways to safely strengthen your upper body and improve your posture. Understand the warning signs of infection and why you may be at risk for an arm infection. Learn about Lymphedema and prevention.  ** You do not attend this class until after surgery.  Drains must be removed to participate  Patient was instructed today in a home exercise program today for post op shoulder range of motion. These included active assist shoulder  flexion in sitting, scapular retraction, wall walking with shoulder abduction, and hands behind head external rotation.  She was encouraged to do these twice a day, holding 3 seconds and repeating 5 times when permitted by her physician.    Idamae Lusher, PT 05/29/2022, 5:08 PM

## 2022-05-30 ENCOUNTER — Encounter: Payer: Self-pay | Admitting: Sports Medicine

## 2022-05-30 ENCOUNTER — Ambulatory Visit: Payer: BC Managed Care – PPO | Admitting: Sports Medicine

## 2022-05-30 DIAGNOSIS — M7502 Adhesive capsulitis of left shoulder: Secondary | ICD-10-CM | POA: Diagnosis not present

## 2022-05-30 MED ORDER — CHLORHEXIDINE GLUCONATE CLOTH 2 % EX PADS: 6.0000 | MEDICATED_PAD | Freq: Once | CUTANEOUS | Status: DC

## 2022-05-30 NOTE — Progress Notes (Signed)
States she is a lot better ROM is still limited some; but the pain itself has improved to where she can sleep at night now

## 2022-05-30 NOTE — Progress Notes (Signed)
Mariah Burnett - 47 y.o. female MRN [id number]  Date of birth: 1975/03/18  Office Visit Note: Visit Date: 05/30/2022 PCP: Billie Lade, MD Referred by: Billie Lade, MD  Subjective: Chief Complaint  Patient presents with   Left Shoulder - Follow-up   HPI: Mariah Burnett is a pleasant 47 y.o. female who presents today for follow-up of left adhesive capsulitis.   Mariah Burnett is doing much better.  Her pain is significantly improved, now able to sleep at night.  Her range of motion is also improved but she still has some stiffness.  She has been very diligent with doing the home exercise plan and is undergoing physical therapy twice weekly as well.  Does have her upcoming seed implantation and breast lumpectomy late next week.  Pertinent ROS were reviewed with the patient and found to be negative unless otherwise specified above in HPI.   Assessment & Plan: Visit Diagnoses:  1. Adhesive capsulitis of left shoulder    Plan: Discussed her prognosis with her adhesive capsulitis.  She did receive excellent relief of her pain and some improvement with range of motion from the high-volume glenohumeral joint injection we performed on 05/15/2022.  States she felt like she was 80% improved and although she still has some limitation with range of motion it is certainly improved from prior visit.  Given this, we will hold on additional high-volume glenohumeral joint injection at this time.  She will continue her home exercises daily as well as her formalized physical therapy.  Did caution her on use of immobility of the shoulder after her lumpectomy.  She will notify me 2 weeks after her surgery how she is doing and see if she needs to be reevaluated or not.  If she has any regression in her pain or range of motion, we will proceed with an additional ultrasound-guided high-volume injection, otherwise continue her home therapy and PT.  Follow-up: Return for will call/notify me in 2 weeks after her breast  surgery.   Meds & Orders: No orders of the defined types were placed in this encounter.  No orders of the defined types were placed in this encounter.    Procedures: No procedures performed      Clinical History: No specialty comments available.  She reports that she has never smoked. She has never used smokeless tobacco. No results for input(s): "HGBA1C", "LABURIC" in the last 8760 hours.  Objective:   Vital Signs: LMP 05/07/2022 (Exact Date)   Physical Exam  Gen: Well-appearing, in no acute distress; non-toxic CV:  Well-perfused. Warm.  Resp: Breathing unlabored on room air; no wheezing. Psych: Fluid speech in conversation; appropriate affect; normal thought process Neuro: Sensation intact throughout. No gross coordination deficits.   Ortho Exam - Left shoulder: No bony TTP.  There is improvement in active and passive range of motion of the shoulder.  Today she is able to perform forward flexion to 165 degrees, abduction to 135 degrees and external rotation between 20 and 25 degrees (compared to 155, 130, 15 degrees last visit).  Able to get internal rotation with thumb to L1 today compared to L4 at last visit.  Imaging: No results found.  Past Medical/Family/Surgical/Social History: Medications & Allergies reviewed per EMR, new medications updated. Patient Active Problem List   Diagnosis Date Noted   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 (HCC) 05/12/2022   Chest tightness 03/04/2022   Family history of coronary artery disease in mother 03/04/2022   Hyperlipidemia 10/17/2021  Encounter to establish care 10/17/2021   Biceps tendinitis, left 10/17/2021   Mild intermittent asthma without complication 01/14/2021   Seasonal and perennial allergic rhinitis 01/14/2021   Dizziness 04/23/2020   Abnormal auditory perception of both ears 04/23/2020   Heart murmur 03/17/2018   Asthma 03/17/2018   Shortness of breath 09/25/2010   Palpitation    MVP (mitral  valve prolapse)    Scoliosis    Past Medical History:  Diagnosis Date   Arthritis    Asthma    Breast cancer (HCC) 04/24/2022   MVP (mitral valve prolapse)    Although no evidence per echo in 2009. Trace MR   Palpitation    Scoliosis    Urticaria    Vaginal Pap smear, abnormal    Family History  Problem Relation Age of Onset   Heart disease Mother    Heart attack Mother    Breast cancer Mother    Urticaria Mother    Cancer Father    Renal cancer Father    Breast cancer Paternal Grandmother    Cancer Paternal Grandfather    Emphysema Paternal Grandfather    Breast cancer Maternal Aunt    Breast cancer Maternal Aunt    Past Surgical History:  Procedure Laterality Date   BREAST BIOPSY Left 04/24/2022   [place] LT BREAST BX W LOC DEV 1ST LESION IMG BX SPEC US GUIDE 04/24/2022 AP-ULTRASOUND   BREAST BIOPSY Left 04/24/2022   [place] LT BREAST BX W LOC DEV EA ADD LESION IMG BX SPEC US GUIDE 04/24/2022 AP-ULTRASOUND   KIDNEY STONE SURGERY Bilateral 2000   WISDOM TOOTH EXTRACTION Bilateral 1991   Social History   Occupational History   Not on file  Tobacco Use   Smoking status: Never   Smokeless tobacco: Never  Vaping Use   Vaping Use: Never used  Substance and Sexual Activity   Alcohol use: Yes    Comment: occ   Drug use: No   Sexual activity: Yes    Birth control/protection: None

## 2022-06-02 DIAGNOSIS — C50912 Malignant neoplasm of unspecified site of left female breast: Secondary | ICD-10-CM | POA: Diagnosis not present

## 2022-06-03 ENCOUNTER — Encounter: Payer: Self-pay | Admitting: Physical Therapy

## 2022-06-03 NOTE — Progress Notes (Signed)

## 2022-06-04 ENCOUNTER — Ambulatory Visit
Admission: RE | Admit: 2022-06-04 | Discharge: 2022-06-04 | Disposition: A | Discharge status: Home / Self Care | Payer: BC Managed Care – PPO | Source: Ambulatory Visit | Attending: General Surgery | Admitting: General Surgery

## 2022-06-04 ENCOUNTER — Encounter: Payer: BC Managed Care – PPO | Admitting: Physical Therapy

## 2022-06-04 ENCOUNTER — Other Ambulatory Visit: Payer: Self-pay | Admitting: General Surgery

## 2022-06-04 DIAGNOSIS — C779 Secondary and unspecified malignant neoplasm of lymph node, unspecified: Secondary | ICD-10-CM | POA: Diagnosis not present

## 2022-06-04 DIAGNOSIS — D0512 Intraductal carcinoma in situ of left breast: Secondary | ICD-10-CM | POA: Diagnosis not present

## 2022-06-04 DIAGNOSIS — Z171 Estrogen receptor negative status [ER-]: Secondary | ICD-10-CM

## 2022-06-04 HISTORY — PX: BREAST BIOPSY: SHX20

## 2022-06-05 ENCOUNTER — Encounter: Payer: BC Managed Care – PPO | Admitting: Rehabilitation

## 2022-06-06 ENCOUNTER — Ambulatory Visit
Admission: RE | Admit: 2022-06-06 | Discharge: 2022-06-06 | Disposition: A | Discharge status: Home / Self Care | Payer: BC Managed Care – PPO | Source: Ambulatory Visit | Attending: General Surgery | Admitting: General Surgery

## 2022-06-06 ENCOUNTER — Other Ambulatory Visit: Payer: Self-pay

## 2022-06-06 ENCOUNTER — Encounter (HOSPITAL_BASED_OUTPATIENT_CLINIC_OR_DEPARTMENT_OTHER): Payer: Self-pay | Admitting: General Surgery

## 2022-06-06 ENCOUNTER — Encounter (HOSPITAL_COMMUNITY)
Admission: RE | Admit: 2022-06-06 | Discharge: 2022-06-06 | Disposition: A | Discharge status: Home / Self Care | Payer: BC Managed Care – PPO | Source: Ambulatory Visit | Attending: General Surgery | Admitting: General Surgery

## 2022-06-06 ENCOUNTER — Other Ambulatory Visit: Payer: Self-pay | Admitting: General Surgery

## 2022-06-06 ENCOUNTER — Encounter (HOSPITAL_BASED_OUTPATIENT_CLINIC_OR_DEPARTMENT_OTHER)
Admission: RE | Disposition: A | Discharge status: Home / Self Care | Payer: Self-pay | Source: Home / Self Care | Attending: General Surgery

## 2022-06-06 ENCOUNTER — Ambulatory Visit (HOSPITAL_BASED_OUTPATIENT_CLINIC_OR_DEPARTMENT_OTHER): Payer: BC Managed Care – PPO | Admitting: Anesthesiology

## 2022-06-06 ENCOUNTER — Ambulatory Visit (HOSPITAL_BASED_OUTPATIENT_CLINIC_OR_DEPARTMENT_OTHER)
Admission: RE | Admit: 2022-06-06 | Discharge: 2022-06-06 | Disposition: A | Discharge status: Home / Self Care | Payer: BC Managed Care – PPO | Attending: General Surgery | Admitting: General Surgery

## 2022-06-06 DIAGNOSIS — C50512 Malignant neoplasm of lower-outer quadrant of left female breast: Secondary | ICD-10-CM

## 2022-06-06 DIAGNOSIS — Z803 Family history of malignant neoplasm of breast: Secondary | ICD-10-CM | POA: Insufficient documentation

## 2022-06-06 DIAGNOSIS — D012 Carcinoma in situ of rectum: Secondary | ICD-10-CM | POA: Diagnosis not present

## 2022-06-06 DIAGNOSIS — Z01818 Encounter for other preprocedural examination: Secondary | ICD-10-CM

## 2022-06-06 DIAGNOSIS — I341 Nonrheumatic mitral (valve) prolapse: Secondary | ICD-10-CM | POA: Diagnosis not present

## 2022-06-06 DIAGNOSIS — C779 Secondary and unspecified malignant neoplasm of lymph node, unspecified: Secondary | ICD-10-CM | POA: Diagnosis not present

## 2022-06-06 DIAGNOSIS — Z170 Estrogen receptor positive status [ER+]: Secondary | ICD-10-CM | POA: Insufficient documentation

## 2022-06-06 DIAGNOSIS — C50212 Malignant neoplasm of upper-inner quadrant of left female breast: Secondary | ICD-10-CM | POA: Diagnosis not present

## 2022-06-06 DIAGNOSIS — D0512 Intraductal carcinoma in situ of left breast: Secondary | ICD-10-CM | POA: Diagnosis not present

## 2022-06-06 DIAGNOSIS — G8918 Other acute postprocedural pain: Secondary | ICD-10-CM | POA: Diagnosis not present

## 2022-06-06 DIAGNOSIS — C773 Secondary and unspecified malignant neoplasm of axilla and upper limb lymph nodes: Secondary | ICD-10-CM | POA: Diagnosis not present

## 2022-06-06 DIAGNOSIS — C50912 Malignant neoplasm of unspecified site of left female breast: Secondary | ICD-10-CM | POA: Diagnosis not present

## 2022-06-06 HISTORY — PX: BREAST LUMPECTOMY WITH RADIOACTIVE SEED AND SENTINEL LYMPH NODE BIOPSY: SHX6550

## 2022-06-06 HISTORY — PX: RADIOACTIVE SEED GUIDED AXILLARY SENTINEL LYMPH NODE: SHX6735

## 2022-06-06 LAB — POCT PREGNANCY, URINE: Preg Test, Ur: NEGATIVE

## 2022-06-06 SURGERY — BREAST LUMPECTOMY WITH RADIOACTIVE SEED AND SENTINEL LYMPH NODE BIOPSY
Anesthesia: Regional | Site: Breast | Laterality: Left

## 2022-06-06 MED ORDER — OXYCODONE HCL 5 MG PO TABS
5.0000 mg | ORAL_TABLET | Freq: Once | ORAL | Status: AC | PRN
  Administered 2022-06-06: 5 mg via ORAL

## 2022-06-06 MED ORDER — TECHNETIUM TC 99M TILMANOCEPT KIT
1.0000 | PACK | Freq: Once | INTRAVENOUS | Status: AC | PRN
  Administered 2022-06-06: 1 via INTRADERMAL

## 2022-06-06 MED ORDER — MIDAZOLAM HCL 5 MG/5ML IJ SOLN
INTRAMUSCULAR | Status: DC | PRN
  Administered 2022-06-06: 1 mg via INTRAVENOUS

## 2022-06-06 MED ORDER — MIDAZOLAM HCL 2 MG/2ML IJ SOLN
INTRAMUSCULAR | Status: AC
  Filled 2022-06-06: qty 2

## 2022-06-06 MED ORDER — PROPOFOL 10 MG/ML IV BOLUS
INTRAVENOUS | Status: DC | PRN
  Administered 2022-06-06: 200 mg via INTRAVENOUS

## 2022-06-06 MED ORDER — LIDOCAINE 2% (20 MG/ML) 5 ML SYRINGE
INTRAMUSCULAR | Status: AC
  Filled 2022-06-06: qty 5

## 2022-06-06 MED ORDER — AMISULPRIDE (ANTIEMETIC) 5 MG/2ML IV SOLN: 10.0000 mg | Freq: Once | INTRAVENOUS | Status: DC | PRN

## 2022-06-06 MED ORDER — OXYCODONE HCL 5 MG/5ML PO SOLN: 5.0000 mg | Freq: Once | ORAL | Status: AC | PRN

## 2022-06-06 MED ORDER — ONDANSETRON HCL 4 MG/2ML IJ SOLN
INTRAMUSCULAR | Status: AC
  Filled 2022-06-06: qty 2

## 2022-06-06 MED ORDER — BUPIVACAINE-EPINEPHRINE 0.25% -1:200000 IJ SOLN
INTRAMUSCULAR | Status: DC | PRN
  Administered 2022-06-06: 20 mL

## 2022-06-06 MED ORDER — GABAPENTIN 300 MG PO CAPS
ORAL_CAPSULE | ORAL | Status: AC
  Filled 2022-06-06: qty 1

## 2022-06-06 MED ORDER — OXYCODONE HCL 5 MG PO TABS
5.0000 mg | ORAL_TABLET | Freq: Four times a day (QID) | ORAL | 0 refills | Status: DC | PRN
Start: 2022-06-06 — End: 2022-06-19

## 2022-06-06 MED ORDER — LACTATED RINGERS IV SOLN: INTRAVENOUS | Status: DC

## 2022-06-06 MED ORDER — ACETAMINOPHEN 500 MG PO TABS
1000.0000 mg | ORAL_TABLET | ORAL | Status: AC
  Administered 2022-06-06: 1000 mg via ORAL

## 2022-06-06 MED ORDER — FENTANYL CITRATE (PF) 100 MCG/2ML IJ SOLN
INTRAMUSCULAR | Status: AC
  Filled 2022-06-06: qty 2

## 2022-06-06 MED ORDER — GABAPENTIN 300 MG PO CAPS
300.0000 mg | ORAL_CAPSULE | ORAL | Status: AC
  Administered 2022-06-06: 300 mg via ORAL

## 2022-06-06 MED ORDER — MIDAZOLAM HCL 2 MG/2ML IJ SOLN
2.0000 mg | Freq: Once | INTRAMUSCULAR | Status: AC
  Administered 2022-06-06: 2 mg via INTRAVENOUS

## 2022-06-06 MED ORDER — LIDOCAINE 2% (20 MG/ML) 5 ML SYRINGE
INTRAMUSCULAR | Status: DC | PRN
  Administered 2022-06-06: 80 mg via INTRAVENOUS

## 2022-06-06 MED ORDER — EPHEDRINE 5 MG/ML INJ
INTRAVENOUS | Status: AC
  Filled 2022-06-06: qty 10

## 2022-06-06 MED ORDER — EPHEDRINE SULFATE-NACL 50-0.9 MG/10ML-% IV SOSY
PREFILLED_SYRINGE | INTRAVENOUS | Status: DC | PRN
  Administered 2022-06-06 (×2): 5 mg via INTRAVENOUS

## 2022-06-06 MED ORDER — BUPIVACAINE HCL (PF) 0.25 % IJ SOLN
INTRAMUSCULAR | Status: DC | PRN
  Administered 2022-06-06: 30 mL via PERINEURAL

## 2022-06-06 MED ORDER — CEFAZOLIN SODIUM-DEXTROSE 2-4 GM/100ML-% IV SOLN
INTRAVENOUS | Status: AC
  Filled 2022-06-06: qty 100

## 2022-06-06 MED ORDER — GABAPENTIN 100 MG PO CAPS
100.0000 mg | ORAL_CAPSULE | Freq: Three times a day (TID) | ORAL | 2 refills | Status: DC
Start: 2022-06-06 — End: 2022-06-19

## 2022-06-06 MED ORDER — OXYCODONE HCL 5 MG PO TABS
ORAL_TABLET | ORAL | Status: AC
  Filled 2022-06-06: qty 1

## 2022-06-06 MED ORDER — PROPOFOL 10 MG/ML IV BOLUS
INTRAVENOUS | Status: AC
  Filled 2022-06-06: qty 20

## 2022-06-06 MED ORDER — FENTANYL CITRATE (PF) 250 MCG/5ML IJ SOLN
INTRAMUSCULAR | Status: DC | PRN
  Administered 2022-06-06: 50 ug via INTRAVENOUS

## 2022-06-06 MED ORDER — ACETAMINOPHEN 500 MG PO TABS
ORAL_TABLET | ORAL | Status: AC
  Filled 2022-06-06: qty 2

## 2022-06-06 MED ORDER — ONDANSETRON HCL 4 MG/2ML IJ SOLN
INTRAMUSCULAR | Status: DC | PRN
  Administered 2022-06-06: 4 mg via INTRAVENOUS

## 2022-06-06 MED ORDER — CEFAZOLIN SODIUM-DEXTROSE 2-4 GM/100ML-% IV SOLN: 2.0000 g | INTRAVENOUS | Status: DC

## 2022-06-06 MED ORDER — DEXAMETHASONE SODIUM PHOSPHATE 10 MG/ML IJ SOLN
INTRAMUSCULAR | Status: AC
  Filled 2022-06-06: qty 1

## 2022-06-06 MED ORDER — DEXAMETHASONE SODIUM PHOSPHATE 10 MG/ML IJ SOLN
INTRAMUSCULAR | Status: DC | PRN
  Administered 2022-06-06: 4 mg via INTRAVENOUS

## 2022-06-06 MED ORDER — FENTANYL CITRATE (PF) 100 MCG/2ML IJ SOLN: 25.0000 ug | INTRAMUSCULAR | Status: DC | PRN

## 2022-06-06 SURGICAL SUPPLY — 46 items
ADH SKN CLS APL DERMABOND .7 (GAUZE/BANDAGES/DRESSINGS) ×2
APL PRP STRL LF DISP 70% ISPRP (MISCELLANEOUS) ×2
APPLIER CLIP 11 MED OPEN (CLIP) ×2
APPLIER CLIP 9.375 MED OPEN (MISCELLANEOUS) ×2
APR CLP MED 11 20 MLT OPN (CLIP) ×2
APR CLP MED 9.3 20 MLT OPN (MISCELLANEOUS) ×2
BLADE SURG 15 STRL LF DISP TIS (BLADE) ×2 IMPLANT
BLADE SURG 15 STRL SS (BLADE) ×2
CANISTER SUCT 1200ML W/VALVE (MISCELLANEOUS) ×2 IMPLANT
CHLORAPREP W/TINT 26 (MISCELLANEOUS) ×2 IMPLANT
CLIP APPLIE 11 MED OPEN (CLIP) ×2 IMPLANT
CLIP APPLIE 9.375 MED OPEN (MISCELLANEOUS) IMPLANT
COVER BACK TABLE 60X90IN (DRAPES) ×2 IMPLANT
COVER MAYO STAND STRL (DRAPES) ×2 IMPLANT
COVER PROBE CYLINDRICAL 5X96 (MISCELLANEOUS) ×2 IMPLANT
DERMABOND ADVANCED .7 DNX12 (GAUZE/BANDAGES/DRESSINGS) ×2 IMPLANT
DRAPE LAPAROSCOPIC ABDOMINAL (DRAPES) ×2 IMPLANT
DRAPE UTILITY XL STRL (DRAPES) ×2 IMPLANT
ELECT COATED BLADE 2.86 ST (ELECTRODE) ×2 IMPLANT
ELECT REM PT RETURN 9FT ADLT (ELECTROSURGICAL) ×2
ELECTRODE REM PT RTRN 9FT ADLT (ELECTROSURGICAL) ×2 IMPLANT
GLOVE BIO SURGEON STRL SZ7.5 (GLOVE) ×2 IMPLANT
GLOVE BIOGEL PI IND STRL 7.0 (GLOVE) IMPLANT
GLOVE SURG SS PI 6.5 STRL IVOR (GLOVE) IMPLANT
GLOVE SURG SS PI 7.5 STRL IVOR (GLOVE) IMPLANT
GOWN STRL REUS W/ TWL LRG LVL3 (GOWN DISPOSABLE) ×4 IMPLANT
GOWN STRL REUS W/TWL LRG LVL3 (GOWN DISPOSABLE) ×6
KIT MARKER MARGIN INK (KITS) IMPLANT
NDL HYPO 25X1 1.5 SAFETY (NEEDLE) ×2 IMPLANT
NDL SAFETY ECLIP 18X1.5 (MISCELLANEOUS) IMPLANT
NEEDLE HYPO 25X1 1.5 SAFETY (NEEDLE) ×2 IMPLANT
NS IRRIG 1000ML POUR BTL (IV SOLUTION) ×2 IMPLANT
PACK BASIN DAY SURGERY FS (CUSTOM PROCEDURE TRAY) ×2 IMPLANT
PENCIL SMOKE EVACUATOR (MISCELLANEOUS) ×2 IMPLANT
SLEEVE SCD COMPRESS KNEE MED (STOCKING) ×2 IMPLANT
SPIKE FLUID TRANSFER (MISCELLANEOUS) IMPLANT
SPONGE T-LAP 18X18 ~~LOC~~+RFID (SPONGE) ×2 IMPLANT
SUT MON AB 4-0 PC3 18 (SUTURE) ×2 IMPLANT
SUT SILK 3 0 PS 1 (SUTURE) IMPLANT
SUT VICRYL 3-0 CR8 SH (SUTURE) ×2 IMPLANT
SYR CONTROL 10ML LL (SYRINGE) ×2 IMPLANT
TOWEL GREEN STERILE FF (TOWEL DISPOSABLE) ×2 IMPLANT
TRACER MAGTRACE VIAL (MISCELLANEOUS) IMPLANT
TRAY FAXITRON CT DISP (TRAY / TRAY PROCEDURE) IMPLANT
TUBE CONNECTING 20X1/4 (TUBING) ×2 IMPLANT
YANKAUER SUCT BULB TIP NO VENT (SUCTIONS) ×2 IMPLANT

## 2022-06-06 NOTE — Transfer of Care (Signed)
Immediate Anesthesia Transfer of Care Note  Patient: Mariah Burnett  Procedure(s) Performed: LEFT BREAST LUMPECTOMY WITH SENTINEL LYMPH NODE BIOPSY (Left: Breast) RADIOACTIVE SEED GUIDED LEFT AXILLARY SENTINEL LYMPH NODE DISSECTION (Left: Axilla)  Patient Location: PACU  Anesthesia Type:General  Level of Consciousness: drowsy  Airway & Oxygen Therapy: Patient Spontanous Breathing and Patient connected to face mask oxygen  Post-op Assessment: Report given to RN and Post -op Vital signs reviewed and stable  Post vital signs: Reviewed and stable  Last Vitals:  Vitals Value Taken Time  BP 115/73   Temp    Pulse 61 06/06/22 1502  Resp 14   SpO2 100 % 06/06/22 1502  Vitals shown include unvalidated device data.  Last Pain:  Vitals:   06/06/22 1215  PainSc: 0-No pain      Patients Stated Pain Goal: 3 (06/06/22 1215)  Complications: No notable events documented.

## 2022-06-06 NOTE — Discharge Instructions (Signed)
May have Tylenol today after 6:30 PM   Post Anesthesia Home Care Instructions  Activity: Get plenty of rest for the remainder of the day. A responsible individual must stay with you for 24 hours following the procedure.  For the next 24 hours, DO NOT: -Drive a car -Advertising copywriter -Drink alcoholic beverages -Take any medication unless instructed by your physician -Make any legal decisions or sign important papers.  Meals: Start with liquid foods such as gelatin or soup. Progress to regular foods as tolerated. Avoid greasy, spicy, heavy foods. If nausea and/or vomiting occur, drink only clear liquids until the nausea and/or vomiting subsides. Call your physician if vomiting continues.  Special Instructions/Symptoms: Your throat may feel dry or sore from the anesthesia or the breathing tube placed in your throat during surgery. If this causes discomfort, gargle with warm salt water. The discomfort should disappear within 24 hours.  If you had a scopolamine patch placed behind your ear for the management of post- operative nausea and/or vomiting:  1. The medication in the patch is effective for 72 hours, after which it should be removed.  Wrap patch in a tissue and discard in the trash. Wash hands thoroughly with soap and water. 2. You may remove the patch earlier than 72 hours if you experience unpleasant side effects which may include dry mouth, dizziness or visual disturbances. 3. Avoid touching the patch. Wash your hands with soap and water after contact with the patch.

## 2022-06-06 NOTE — Anesthesia Procedure Notes (Signed)
Anesthesia Regional Block: Pectoralis block   Pre-Anesthetic Checklist: , timeout performed,  Correct Patient, Correct Site, Correct Laterality,  Correct Procedure, Correct Position, site marked,  Risks and benefits discussed,  Surgical consent,  Pre-op evaluation,  At surgeon's request and post-op pain management  Laterality: Left  Prep: chloraprep       Needles:  Injection technique: Single-shot  Needle Type: Echogenic Stimulator Needle     Needle Length: 9cm  Needle Gauge: 21     Additional Needles:   Procedures:,,,, ultrasound used (permanent image in chart),,    Narrative:  Start time: 06/06/2022 12:52 PM End time: 06/06/2022 12:55 PM Injection made incrementally with aspirations every 5 mL.  Performed by: Personally  Anesthesiologist: Linton Rump, MD  Additional Notes: Discussed risks and benefits of nerve block including, but not limited to, prolonged and/or permanent nerve injury involving sensory and/or motor function. Monitors were applied and a time-out was performed. The nerve and associated structures were visualized under ultrasound guidance. After negative aspiration, local anesthetic was slowly injected around the nerve. There was no evidence of high pressure during the procedure. There were no paresthesias. VSS remained stable and the patient tolerated the procedure well.

## 2022-06-06 NOTE — Progress Notes (Signed)
Assisted Dr. Ardeen Jourdain with left, pectoralis, ultrasound guided block. Side rails up, monitors on throughout procedure. See vital signs in flow sheet. Tolerated Procedure well.

## 2022-06-06 NOTE — Anesthesia Postprocedure Evaluation (Signed)
Anesthesia Post Note  Patient: Mariah Burnett  Procedure(s) Performed: LEFT BREAST LUMPECTOMY WITH RAADIOACTIVE SEED AND SENTINEL LYMPH NODE BIOPSY (Left: Breast) RADIOACTIVE SEED GUIDED LEFT AXILLARY SENTINEL LYMPH NODE DISSECTION (Left: Axilla)     Patient location during evaluation: PACU Anesthesia Type: Regional and General Level of consciousness: awake Pain management: pain level controlled Vital Signs Assessment: post-procedure vital signs reviewed and stable Respiratory status: spontaneous breathing, nonlabored ventilation and respiratory function stable Cardiovascular status: blood pressure returned to baseline and stable Postop Assessment: no apparent nausea or vomiting Anesthetic complications: no   No notable events documented.  Last Vitals:  Vitals:   06/06/22 1530 06/06/22 1547  BP: 122/80 (!) 121/95  Pulse: 64 67  Resp: 19 16  Temp:  36.8 C  SpO2: 100% 99%    Last Pain:  Vitals:   06/06/22 1542  PainSc: 3                  Linton Rump

## 2022-06-06 NOTE — Anesthesia Procedure Notes (Signed)
Procedure Name: LMA Insertion Date/Time: 06/06/2022 1:36 PM  Performed by: Demetrio Lapping, CRNAPre-anesthesia Checklist: Patient identified, Emergency Drugs available, Suction available and Patient being monitored Patient Re-evaluated:Patient Re-evaluated prior to induction Oxygen Delivery Method: Circle System Utilized Preoxygenation: Pre-oxygenation with 100% oxygen Induction Type: IV induction Ventilation: Mask ventilation without difficulty LMA: LMA inserted LMA Size: 4.0 Number of attempts: 1 Airway Equipment and Method: Bite block Placement Confirmation: positive ETCO2 Tube secured with: Tape Dental Injury: Teeth and Oropharynx as per pre-operative assessment

## 2022-06-06 NOTE — Op Note (Signed)
06/06/2022  2:52 PM  PATIENT:  Mariah Burnett  47 y.o. female  PRE-OPERATIVE DIAGNOSIS:  LEFT BREAST CANCER  POST-OPERATIVE DIAGNOSIS:  LEFT BREAST CANCER  PROCEDURE:  Procedure(s): LEFT BREAST RADIOACTIVE SEED LOCALIZED LUMPECTOMY WITH DEEP LEFT AXILLARY SENTINEL LYMPH NODE BIOPSY (Left) RADIOACTIVE SEED GUIDED LEFT AXILLARY TARGETED  LYMPH NODE DISSECTION (Left)  SURGEON:  Surgeon(s) and Role:    * Griselda Miner, MD - Primary  PHYSICIAN ASSISTANT:   ASSISTANTS: none   ANESTHESIA:   local and general  EBL:  30 mL   BLOOD ADMINISTERED:none  DRAINS: none   LOCAL MEDICATIONS USED:  MARCAINE     SPECIMEN:  Source of Specimen:  left breast tissue with additional inferior margin and sentinel node x 3, targeted node  DISPOSITION OF SPECIMEN:  PATHOLOGY  COUNTS:  YES  TOURNIQUET:  * No tourniquets in log *  DICTATION: .Dragon Dictation  After informed consent was obtained the patient was brought to the operating room and placed in the supine position on the operating table.  After adequate induction of general anesthesia the patient's left chest, breast, and axillary area were prepped with ChloraPrep, allowed to dry, and draped in usual sterile manner.  An appropriate timeout was performed.  Previously an I-125 seed was placed in the lower outer quadrant of the left breast to mark an area of invasive breast cancer.  A second I-125 seed was placed in the left axilla to mark a previously positive lymph node.  Also earlier in the day the patient underwent injection of 1 mCi of technetium sulfur colloid in the subareolar position of the left breast.  At this point the attention was first turned to the left breast.  The neoprobe was set to I-125 in the area of radioactivity was readily identified.  The area around this was infiltrated with quarter percent Marcaine.  I elected to make a curvilinear incision along the lower outer edge of the left breast with a 15 blade knife.  The incision  was carried through the skin and subcutaneous tissue sharply with the electrocautery.  Dissection was then carried superiorly and medially from the incision between the breast tissue in the subcutaneous fat and skin.  Once this dissection was well beyond the area of the cancer I then removed a circular portion of breast tissue sharply with the electrocautery around the radioactive seed while checking the area of radioactivity frequently.  Once the specimen was removed it was oriented with the appropriate paint colors.  A specimen radiograph was obtained that showed the clip and seed to be near the center of the specimen.  I did elect to take an additional inferior margin and this was marked appropriately.  This tissue was sent to pathology for further evaluation.  Hemostasis was achieved using the Bovie electrocautery.  The wound was irrigated with saline and infiltrated with more quarter percent Marcaine.  The cavity was marked with clips.  The deep layer of the incision was then closed with layers of interrupted 3-0 Vicryl stitches.  The skin was closed with a running 4-0 Monocryl subcuticular stitch.  Attention was then turned to the left axilla.  The neoprobe was used to identify the area of radioactivity from the seed.  The area overlying this was infiltrated with quarter percent Marcaine.  A transversely oriented incision was made with a 15 blade knife overlying the area of radioactivity.  The incision was carried through the skin and subcutaneous tissue sharply with the electrocautery until the deep left  axillary space was entered.  Dissection was then carried towards the radioactive seed under the direction of the neoprobe.  I was able to identify the lymph node with the seed.  This was excised sharply with the electrocautery and the surrounding small vessels and lymphatics were controlled with clips.  This was sent as the targeted node.  The neoprobe was then set to technetium and I was able to identify  some signal in the left axilla.  I was able to identify 3 other lymph nodes with radioactivity.  These were excised sharply with the electrocautery and the surrounding small vessels and lymphatics were controlled with clips.  These were sent as sentinel nodes numbers 1 through 3.  Ex vivo counts on these nodes ranged from 30-100.  No other hot or palpable nodes were identified in the left axilla.  Hemostasis was achieved using the Bovie electrocautery.  The deep layer of the incision was then closed with interrupted 3-0 Vicryl stitches.  The skin was closed with a running 4-0 Monocryl subcuticular stitch.  Dermabond dressings were applied.  The patient tolerated the procedure well.  At the end of the case all needle sponge and instrument counts were correct.  The patient was then awakened and taken to recovery in stable condition.  PLAN OF CARE: Discharge to home after PACU  PATIENT DISPOSITION:  PACU - hemodynamically stable.   Delay start of Pharmacological VTE agent (>24hrs) due to surgical blood loss or risk of bleeding: not applicable

## 2022-06-06 NOTE — Progress Notes (Signed)
Nuc med injection performed by nuc med staff, patient tolerated well with no additional sedation.

## 2022-06-06 NOTE — H&P (Signed)
REFERRING PHYSICIAN: Christel Mormon, MD PROVIDER: Lindell Noe, MD MRN: [id number] DOB: [birth year] Subjective   Chief Complaint: Breast Cancer  History of Present Illness: Mariah Burnett is a 47 y.o. female who is seen today as an office consultation for evaluation of Breast Cancer  We are asked to see the patient in consultation by Dr. Delmar Landau to evaluate her for a new left breast cancer. The patient is a 47 year old white female who felt a mass in the lower outer left breast around Christmas time. She brought this to her doctor's attention and was evaluated with mammogram and ultrasound. She was found to have a 1.8 cm mass in the lower outer quadrant of the left breast with 1 positive lymph node. The cancer was ER and PR positive and HER2 negative with a Ki-67 of 10%. She does have a strong family history of breast cancer with her mother, 2 aunts, and a paternal grandmother. She had genetic testing previously that she says was negative. She is otherwise in good health and does not smoke.  Review of Systems: A complete review of systems was obtained from the patient. I have reviewed this information and discussed as appropriate with the patient. See HPI as well for other ROS.  ROS   Medical History: Past Medical History:  Diagnosis Date  Anxiety  Asthma, unspecified asthma severity, unspecified whether complicated, unspecified whether persistent (HHS-HCC)   Patient Active Problem List  Diagnosis  Malignant neoplasm of lower-outer quadrant of left breast of female, estrogen receptor positive (CMS/HHS-HCC)   History reviewed. No pertinent surgical history.   No Known Allergies  Current Outpatient Medications on File Prior to Visit  Medication Sig Dispense Refill  levocetirizine (XYZAL) 5 MG tablet Take 1 tablet by mouth every evening  metoprolol tartrate (LOPRESSOR) 25 MG tablet TAKE HALF A TABLET BY MOUTH TWICE A DAY   No current facility-administered medications on file  prior to visit.   Family History  Problem Relation Age of Onset  Diabetes Mother  Breast cancer Mother  Myocardial Infarction (Heart attack) Mother    Social History   Tobacco Use  Smoking Status Never  Smokeless Tobacco Never    Social History   Socioeconomic History  Marital status: Divorced  Tobacco Use  Smoking status: Never  Smokeless tobacco: Never   Social Determinants of Health   Financial Resource Strain: Low Risk (03/05/2021)  Received from [facility]  Overall Financial Resource Strain (CARDIA)  Difficulty of Paying Living Expenses: Not very hard  Food Insecurity: Food Insecurity Present (03/05/2021)  [facility]  Hunger Vital Sign  Worried About Running Out of Food in the Last Year: Sometimes true  Ran Out of Food in the Last Year: Sometimes true  Transportation Needs: No Transportation Needs (03/05/2021)  Received from [facility] - Transportation  Lack of Transportation (Medical): No  Lack of Transportation (Non-Medical): No  Physical Activity: Sufficiently Active (03/05/2021)  Received from [facility]  Exercise Vital Sign  Days of Exercise per Week: 7 days  Minutes of Exercise per Session: 60 min  Stress: No Stress Concern Present (03/05/2021)  Received from [facility] - Occupational Stress Questionnaire  Feeling of Stress : Not at all  Social Connections: Moderately Isolated (03/05/2021)  Received from [facility]  Social Connection and Isolation Panel [NHANES]  Frequency of Communication with Friends and Family: Once a week  Frequency of Social Gatherings with Friends and Family: Once a week  Attends  Religious Services: More than 4 times per year  Active Member of Clubs or Organizations: Yes  Attends Banker Meetings: More than 4 times per year  Marital Status: Separated   Objective:   Vitals:  Pulse: (!) 142  Weight: 71.2 kg (157 lb)  Height: 170.2 cm (5\' 7" )   PainSc: 0-No pain   Body mass index is 24.59 kg/m.  Physical Exam Vitals reviewed.  Constitutional:  General: She is not in acute distress. Appearance: Normal appearance.  HENT:  Head: Normocephalic and atraumatic.  Right Ear: External ear normal.  Left Ear: External ear normal.  Nose: Nose normal.  Mouth/Throat:  Mouth: Mucous membranes are moist.  Pharynx: Oropharynx is clear.  Eyes:  General: No scleral icterus. Extraocular Movements: Extraocular movements intact.  Conjunctiva/sclera: Conjunctivae normal.  Pupils: Pupils are equal, round, and reactive to light.  Cardiovascular:  Rate and Rhythm: Normal rate and regular rhythm.  Pulses: Normal pulses.  Heart sounds: Normal heart sounds.  Pulmonary:  Effort: Pulmonary effort is normal. No respiratory distress.  Breath sounds: Normal breath sounds.  Abdominal:  General: Bowel sounds are normal.  Palpations: Abdomen is soft.  Tenderness: There is no abdominal tenderness.  Musculoskeletal:  General: No swelling, tenderness or deformity. Normal range of motion.  Cervical back: Normal range of motion and neck supple.  Skin: General: Skin is warm and dry.  Coloration: Skin is not jaundiced.  Neurological:  General: No focal deficit present.  Mental Status: She is alert and oriented to person, place, and time.  Psychiatric:  Mood and Affect: Mood normal.  Behavior: Behavior normal.     Breast: There is a 2 cm mobile palpable mass in the lower outer left breast. There is no palpable mass in the right breast. There is no palpable axillary, supraclavicular, or cervical lymphadenopathy.  Labs, Imaging and Diagnostic Testing:  Assessment and Plan:   Diagnoses and all orders for this visit:  Malignant neoplasm of lower-outer quadrant of left breast of female, estrogen receptor positive (CMS/HHS-HCC)   The patient appears to have a 1.8 cm cancer in the lower outer quadrant of the left breast with a single positive  lymph node and all favorable markers. I have discussed with her in detail the different options for treatment and at this point she is favoring breast conservation which I feel is very reasonable. She would be a candidate for sentinel node mapping and targeted node dissection. I will refer her to medical and radiation oncology to discuss possible neoadjuvant therapy which could shrink the cancer and the lymph node. If she does need chemotherapy she would require a port and we have discussed this as well. We will call her once we have her plan finalized so that we can proceed accordingly.

## 2022-06-06 NOTE — Interval H&P Note (Signed)
History and Physical Interval Note:  06/06/2022 12:33 PM  Mariah Burnett  has presented today for surgery, with the diagnosis of LEFT BREAST CANCER.  The various methods of treatment have been discussed with the patient and family. After consideration of risks, benefits and other options for treatment, the patient has consented to  Procedure(s): LEFT BREAST LUMPECTOMY WITH SENTINEL LYMPH NODE BX (Left) RADIOACTIVE SEED GUIDED LEFT AXILLARY SENTINEL LYMPH NODE DISSECTION (Left) as a surgical intervention.  The patient's history has been reviewed, patient examined, no change in status, stable for surgery.  I have reviewed the patient's chart and labs.  Questions were answered to the patient's satisfaction.     Chevis Pretty III

## 2022-06-06 NOTE — Anesthesia Preprocedure Evaluation (Addendum)
Anesthesia Evaluation  Patient identified by MRN, date of birth, ID band Patient awake    Reviewed: Allergy & Precautions, NPO status , Patient's Chart, lab work & pertinent test results, reviewed documented beta blocker date and time   History of Anesthesia Complications Negative for: history of anesthetic complications  Airway Mallampati: II  TM Distance: >3 FB Neck ROM: Full    Dental  (+) Dental Advisory Given   Pulmonary neg shortness of breath, asthma (no recent flares) , neg sleep apnea, neg COPD, neg recent URI, neg PE   Pulmonary exam normal breath sounds clear to auscultation       Cardiovascular (-) hypertension(-) angina (-) Past MI, (-) Cardiac Stents and (-) CABG + dysrhythmias (palpitation, on metoprolol) + Valvular Problems/Murmurs MVP  Rhythm:Regular Rate:Normal  HLD   Neuro/Psych negative neurological ROS     GI/Hepatic negative GI ROS, Neg liver ROS,,,  Endo/Other  negative endocrine ROS    Renal/GU negative Renal ROS     Musculoskeletal  (+) Arthritis ,  scoliosis   Abdominal   Peds  Hematology negative hematology ROS (+)   Anesthesia Other Findings Left Breast cancer  Reproductive/Obstetrics                             Anesthesia Physical Anesthesia Plan  ASA: 2  Anesthesia Plan: General and Regional   Post-op Pain Management: Regional block* and Tylenol PO (pre-op)*   Induction: Intravenous  PONV Risk Score and Plan: 3 and Ondansetron, Dexamethasone and Treatment may vary due to age or medical condition  Airway Management Planned: LMA  Additional Equipment:   Intra-op Plan:   Post-operative Plan: Extubation in OR  Informed Consent: I have reviewed the patients History and Physical, chart, labs and discussed the procedure including the risks, benefits and alternatives for the proposed anesthesia with the patient or authorized representative who has  indicated his/her understanding and acceptance.     Dental advisory given  Plan Discussed with: CRNA and Anesthesiologist  Anesthesia Plan Comments: (Discussed potential risks of nerve blocks including, but not limited to, infection, bleeding, nerve damage, seizures, pneumothorax, respiratory depression, and potential failure of the block. Alternatives to nerve blocks discussed. All questions answered.  Risks of general anesthesia discussed including, but not limited to, sore throat, hoarse voice, chipped/damaged teeth, injury to vocal cords, nausea and vomiting, allergic reactions, lung infection, heart attack, stroke, and death. All questions answered. )       Anesthesia Quick Evaluation

## 2022-06-08 ENCOUNTER — Encounter (HOSPITAL_BASED_OUTPATIENT_CLINIC_OR_DEPARTMENT_OTHER): Payer: Self-pay | Admitting: General Surgery

## 2022-06-09 LAB — SURGICAL PATHOLOGY

## 2022-06-11 ENCOUNTER — Encounter: Payer: Self-pay | Admitting: *Deleted

## 2022-06-11 ENCOUNTER — Telehealth: Payer: Self-pay | Admitting: *Deleted

## 2022-06-11 ENCOUNTER — Ambulatory Visit (INDEPENDENT_AMBULATORY_CARE_PROVIDER_SITE_OTHER): Payer: BC Managed Care – PPO

## 2022-06-11 DIAGNOSIS — J309 Allergic rhinitis, unspecified: Secondary | ICD-10-CM

## 2022-06-11 NOTE — Telephone Encounter (Signed)
Ordered oncotype 6/5 per Dr. Pamelia Hoit. Sent requisition to pathology and exact sciences.

## 2022-06-12 ENCOUNTER — Encounter: Payer: BC Managed Care – PPO | Admitting: Rehabilitation

## 2022-06-17 DIAGNOSIS — C50512 Malignant neoplasm of lower-outer quadrant of left female breast: Secondary | ICD-10-CM | POA: Diagnosis not present

## 2022-06-17 DIAGNOSIS — Z170 Estrogen receptor positive status [ER+]: Secondary | ICD-10-CM | POA: Diagnosis not present

## 2022-06-18 ENCOUNTER — Encounter: Payer: Self-pay | Admitting: *Deleted

## 2022-06-18 DIAGNOSIS — Z170 Estrogen receptor positive status [ER+]: Secondary | ICD-10-CM

## 2022-06-19 ENCOUNTER — Encounter: Payer: Self-pay | Admitting: Adult Health

## 2022-06-19 ENCOUNTER — Encounter: Payer: Self-pay | Admitting: *Deleted

## 2022-06-19 ENCOUNTER — Encounter: Payer: BC Managed Care – PPO | Admitting: Rehabilitation

## 2022-06-19 ENCOUNTER — Inpatient Hospital Stay: Payer: BC Managed Care – PPO | Attending: Hematology and Oncology | Admitting: Adult Health

## 2022-06-19 ENCOUNTER — Other Ambulatory Visit: Payer: Self-pay

## 2022-06-19 ENCOUNTER — Telehealth: Payer: Self-pay | Admitting: Radiation Oncology

## 2022-06-19 VITALS — BP 128/85 | HR 64 | Temp 97.9°F | Resp 18 | Ht 67.0 in | Wt 160.0 lb

## 2022-06-19 DIAGNOSIS — C50512 Malignant neoplasm of lower-outer quadrant of left female breast: Secondary | ICD-10-CM | POA: Insufficient documentation

## 2022-06-19 DIAGNOSIS — Z170 Estrogen receptor positive status [ER+]: Secondary | ICD-10-CM

## 2022-06-19 MED ORDER — GABAPENTIN 100 MG PO CAPS
200.0000 mg | ORAL_CAPSULE | Freq: Three times a day (TID) | ORAL | 0 refills | Status: DC
Start: 2022-06-19 — End: 2022-09-04

## 2022-06-19 MED ORDER — OXYCODONE HCL 5 MG PO TABS: 5.0000 mg | ORAL_TABLET | Freq: Four times a day (QID) | ORAL | 0 refills | Status: DC | PRN

## 2022-06-19 NOTE — Research (Signed)
[id number]: A Phase III Adjuvant Trial Evaluating the Addition of Adjuvant Chemotherapy to Ovarian Function Suppression plus Endocrine Therapy in Premenopausal Patients with pN0-1, ER-Positive/HER2-Negative Breast Cancer and an Oncotype Recurrence Score ? 25 (OFSET)  Patient's Oncotype score resulted = 3 which meets eligibility criteria for the above study.  Met with patient and husband for approximately 10 minutes to see if patient has any questions about the study.  Patient's questioned answered. She is undecided about the study. We discussed randomization and patient is unsure if she wants to be randomized because she is leaning towards not wanting chemotherapy.  She will discuss her treatment options again with Dr. Pamelia Hoit in 2 weeks and make a decision about participating in the study. Patient has my card and was encouraged to call if any new questions or wants to discuss further before next appointment.  Domenica Reamer, BSN, RN, [employer] Research Nurse II [phone] 06/19/2022 4:32 PM

## 2022-06-19 NOTE — Assessment & Plan Note (Addendum)
Mariah Burnett is a 47 year old woman with stage Ia left breast invasive ductal carcinoma, ER/PR positive here today for follow-up and evaluation after undergoing left breast lumpectomy on Jun 06, 2022.  Stage Ia left breast cancer: 1 lymph node was positive for metastatic cancer.  Due to this and based on Rxponder clinical trial results all women with positive lymph node that are premenopausal are recommended adjuvant chemotherapy.  Dr. Earmon Phoenix recommendation is for her to either follow standard of care and received 4 cycles of Taxotere and Cytoxan adjuvantly followed by ovarian suppression and an aromatase inhibitor versus enrolling in the OFSET clinical trial which randomizes patient to chemotherapy plus ovarian suppression and aromatase inhibitor or ovarian suppression and aromatase inhibitors adjuvantly without chemotherapy.  She is going to speak with our research nurse cameo further about this today and will make her final decision today.  We will make follow-up appointments accordingly. Postsurgical pain: I sent in gabapentin at an increased dose.  She is to take 200 mg p.o. 3 times daily for 3 days and then increase to 300 mg p.o. 3 times daily.  I also sent in oxycodone #15 since she has 5 tablets left.  PMP aware reviewed no red flags.  I suggested that Aleve in the evening may help with some of the discomfort and inflammation. Next steps: She will either receive chemotherapy or go on clinical trial.  She will require adjuvant radiation in the future as well.  RTC in 2-3 weeks.

## 2022-06-19 NOTE — Progress Notes (Signed)
[facility] Cancer Follow up:    Billie Lade, MD [address] [place] [address]   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 [facility]) Staging form: Breast, AJCC 8th Edition - Clinical stage from 05/12/2022: Stage IIA (cT2, cN1(f), cM0, G2, ER+, PR+, HER2-) - Signed by Ronny Bacon, PA-C on 05/14/2022 Stage prefix: Initial diagnosis Method of lymph node assessment: Core biopsy Histologic grading system: 3 grade system - Pathologic stage from 06/06/2022: Stage IA (pT1c, pN1a, cM0, G2, ER+, PR+, HER2-, Oncotype DX score: 3) - Signed by Loa Socks, NP on 06/19/2022 Stage prefix: Initial diagnosis Multigene prognostic tests performed: Oncotype DX Recurrence score range: Less than 11 Histologic grading system: 3 grade system   SUMMARY OF ONCOLOGIC HISTORY: Oncology History  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 (HCC)  04/24/2022 Initial Diagnosis   Left breast mass at 4 o'clock position 1.8 cm, 1 abnormal lymph node (positive), MRI revealed 2.3 cm mass: Biopsy: Grade 2 IDC with papillary features ER 80%, PR 100%, Ki-67 10%, HER2 0 negative   05/12/2022 Cancer Staging   Staging form: Breast, AJCC 8th Edition - Clinical stage from 05/12/2022: Stage IIA (cT2, cN1(f), cM0, G2, ER+, PR+, HER2-) - Signed by Ronny Bacon, PA-C on 05/14/2022 Stage prefix: Initial diagnosis Method of lymph node assessment: Core biopsy Histologic grading system: 3 grade system   06/06/2022 Surgery   Left breast lumpectomy: IDC, 1.6 cm, grade 2, margins negative, 1/4 LN + carcinoma.    06/06/2022 Oncotype testing   3/2%   06/06/2022 Cancer Staging   Staging form: Breast, AJCC 8th Edition - Pathologic stage from 06/06/2022: Stage IA (pT1c, pN1a, cM0, G2, ER+, PR+, HER2-, Oncotype DX score: 3) - Signed by Loa Socks, NP on 06/19/2022 Stage prefix: Initial diagnosis Multigene prognostic  tests performed: Oncotype DX Recurrence score range: Less than 11 Histologic grading system: 3 grade system     CURRENT THERAPY: s/p surgery  INTERVAL HISTORY: Mariah Burnett 47 y.o. female returns for f/u after undergoing left bresat lumpectomy on 06/06/2022 that demonstrated IDC, grade 2, 1.6cm, and 1/4 + LN.  Oncotype DX was 3.  She is experiencing continued numbness down her left arm.  She is taking gabapentin 100mg  PO TID, and tolerates the gabapentin well however she is still having the neuropathic pain.  She also notes discomfort worse in her breast and shoulder at night which makes it difficult for her to sleep.  She occasionally takes the oxycodone as needed at night to help her sleep.  She has been consistently taking the gabapentin.   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  MEDICAL HISTORY: Past Medical History:  Diagnosis Date   Arthritis    Asthma    Breast cancer (HCC) 04/24/2022  MVP (mitral valve prolapse)    Although no evidence per echo in 2009. Trace MR   Palpitation    Scoliosis    Urticaria    Vaginal Pap smear, abnormal     SURGICAL HISTORY: Past Surgical History:  Procedure Laterality Date   BREAST BIOPSY Left 04/24/2022   [place] LT BREAST BX W LOC DEV 1ST LESION IMG BX SPEC US GUIDE 04/24/2022 [facility]-ULTRASOUND   BREAST  BIOPSY Left 04/24/2022   [place] LT BREAST BX W LOC DEV EA ADD LESION IMG BX SPEC US GUIDE 04/24/2022 AP-ULTRASOUND   BREAST BIOPSY Left 06/04/2022   [place] LT RADIOACTIVE SEED LOC 06/04/2022 [facility] MAMMOGRAPHY   BREAST BIOPSY Left 06/04/2022   [place] LT RADIOACTIVE SEED EA ADD LESION 06/04/2022 [facility] MAMMOGRAPHY   BREAST LUMPECTOMY WITH RADIOACTIVE SEED AND SENTINEL LYMPH NODE BIOPSY Left 06/06/2022   Procedure: LEFT BREAST LUMPECTOMY WITH RAADIOACTIVE SEED AND SENTINEL LYMPH NODE BIOPSY;  Surgeon: Griselda Miner, MD;  Location: [facility];  Service: General;  Laterality: Left;   KIDNEY STONE SURGERY Bilateral 2000   RADIOACTIVE SEED GUIDED AXILLARY SENTINEL LYMPH NODE Left 06/06/2022   Procedure: RADIOACTIVE SEED GUIDED LEFT AXILLARY SENTINEL LYMPH NODE DISSECTION;  Surgeon: Griselda Miner, MD;  Location: [facility];  Service: General;  Laterality: Left;   WISDOM TOOTH EXTRACTION Bilateral 1991    SOCIAL HISTORY: Social History   Socioeconomic History   Marital status: Divorced    Spouse name: Not on file   Number of children: Not on file   Years of education: Not on file   Highest education level: Not on file  Occupational History   Not on file  Tobacco Use   Smoking status: Never   Smokeless tobacco: Never  Vaping Use   Vaping Use: Never used  Substance and Sexual Activity   Alcohol use: Yes    Comment: occ   Drug use: No   Sexual activity: Yes    Birth control/protection: None  Other Topics Concern   Not on file  Social History Narrative   Not on file   Social Determinants of Health   Financial Resource Strain: Low Risk  (03/05/2021)   Overall Financial Resource Strain (CARDIA)    Difficulty of Paying Living Expenses: Not very hard  Food Insecurity: Food Insecurity Present (03/05/2021)   Hunger Vital Sign    Worried About Running Out of Food in the Last Year: Sometimes true    Ran Out of Food in the Last Year: Sometimes true  Transportation Needs: No  Transportation Needs (03/05/2021)   PRAPARE - Transportation    Lack of Transportation (Medical): No    Lack of Transportation (Non-Medical): No  Physical Activity: Sufficiently Active (03/05/2021)   Exercise Vital Sign    Days of Exercise per Week: 7 days    Minutes of Exercise per Session: 60 min  Stress: No Stress Concern Present (03/05/2021)   [employer] of Occupational Health - Occupational Stress Questionnaire    Feeling of Stress : Not at all  Social Connections: Moderately Isolated (03/05/2021)   Social Connection and Isolation Panel [NHANES]    Frequency of Communication with Friends and Family: Once a week    Frequency of Social Gatherings with Friends and Family: Once a week    Attends Religious Services: More than 4 times per year    Active Member of [employer] or Organizations: Yes    Attends Banker Meetings: More than 4 times per year  Marital Status: Separated  Intimate Partner Violence: Not At Risk (03/05/2021)   Humiliation, Afraid, Rape, and Kick questionnaire    Fear of Current or Ex-Partner: No    Emotionally Abused: No    Physically Abused: No    Sexually Abused: No    FAMILY HISTORY: Family History  Problem Relation Age of Onset   Heart disease Mother    Heart attack Mother    Breast cancer Mother    Urticaria Mother    Cancer Father    Renal cancer Father    Breast cancer Paternal Grandmother    Cancer Paternal Grandfather    Emphysema Paternal Grandfather    Breast cancer Maternal Aunt    Breast cancer Maternal Aunt     Review of Systems  Constitutional:  Negative for appetite change, chills, fatigue, fever and unexpected weight change.  HENT:   Negative for hearing loss, lump/mass and trouble swallowing.   Eyes:  Negative for eye problems and icterus.  Respiratory:  Negative for chest tightness, cough and shortness of breath.   Cardiovascular:  Negative for chest pain, leg swelling and palpitations.  Gastrointestinal:  Negative  for abdominal distention, abdominal pain, constipation, diarrhea, nausea and vomiting.  Endocrine: Negative for hot flashes.  Genitourinary:  Negative for difficulty urinating.   Musculoskeletal:  Negative for arthralgias.  Skin:  Negative for itching and rash.  Neurological:  Negative for dizziness, extremity weakness, headaches and numbness.  Hematological:  Negative for adenopathy. Does not bruise/bleed easily.  Psychiatric/Behavioral:  Negative for depression. The patient is not nervous/anxious.       PHYSICAL EXAMINATION   Onc Performance Status - 06/19/22 1510       ECOG Perf Status   ECOG Perf Status Restricted in physically strenuous activity but ambulatory and able to carry out work of a light or sedentary nature, e.g., light house work, office work      KPS SCALE   KPS % SCORE Normal activity with effort, some s/s of disease   2 weeks post op-L breast lumpectomy/L Lymph node removal            Vitals:   06/19/22 1504  BP: 128/85  Pulse: 64  Resp: 18  Temp: 97.9 F (36.6 C)  SpO2: 100%    Physical Exam Constitutional:      General: She is not in acute distress.    Appearance: Normal appearance. She is not toxic-appearing.  HENT:     Head: Normocephalic and atraumatic.     Mouth/Throat:     Mouth: Mucous membranes are moist.     Pharynx: Oropharynx is clear. No oropharyngeal exudate or posterior oropharyngeal erythema.  Eyes:     General: No scleral icterus. Cardiovascular:     Rate and Rhythm: Normal rate and regular rhythm.     Pulses: Normal pulses.     Heart sounds: Normal heart sounds.  Pulmonary:     Effort: Pulmonary effort is normal.     Breath sounds: Normal breath sounds.  Chest:     Comments: Left breast status postlumpectomy.  The incision sites are well-healed.  There is mild swelling present, but no sign of infection noted. Abdominal:     General: Abdomen is flat. Bowel sounds are normal. There is no distension.     Palpations: Abdomen  is soft.     Tenderness: There is no abdominal tenderness.  Musculoskeletal:        General: No swelling.     Cervical back: Neck supple.  Lymphadenopathy:  Cervical: No cervical adenopathy.  Skin:    General: Skin is warm and dry.     Findings: No rash.  Neurological:     General: No focal deficit present.     Mental Status: She is alert.  Psychiatric:        Mood and Affect: Mood normal.        Behavior: Behavior normal.     LABORATORY DATA:  CBC    Component Value Date/Time   WBC 5.4 04/05/2019 1247   RBC 4.22 04/05/2019 1247   HGB 13.6 04/05/2019 1247   HCT 40.2 04/05/2019 1247   PLT 202 04/05/2019 1247   MCV 95.3 04/05/2019 1247   MCH 32.2 04/05/2019 1247   MCHC 33.8 04/05/2019 1247   RDW 11.7 04/05/2019 1247   LYMPHSABS 1.5 06/26/2017 1000   MONOABS 0.3 06/26/2017 1000   EOSABS 0.1 06/26/2017 1000   BASOSABS 0.0 06/26/2017 1000    CMP     Component Value Date/Time   NA 136 04/05/2019 1247   K 3.4 (L) 04/05/2019 1247   CL 103 04/05/2019 1247   CO2 24 04/05/2019 1247   GLUCOSE 96 04/05/2019 1247   BUN 13 04/05/2019 1247   CREATININE 0.63 04/05/2019 1247   CALCIUM 9.3 04/05/2019 1247   PROT 7.9 08/20/2015 1909   ALBUMIN 4.7 08/20/2015 1909   AST 20 08/20/2015 1909   ALT 13 (L) 08/20/2015 1909   ALKPHOS 37 (L) 08/20/2015 1909   BILITOT 1.3 (H) 08/20/2015 1909   GFRNONAA >60 04/05/2019 1247   GFRAA >60 04/05/2019 1247    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 (HCC) Mariah Burnett is a 47 year old woman with stage Ia left breast invasive ductal carcinoma, ER/PR positive here today for follow-up and evaluation after undergoing left breast lumpectomy on Jun 06, 2022.  Stage Ia left breast cancer: 1 lymph node was positive for metastatic cancer.  Due to this and based on Rxponder clinical trial results all women with positive lymph node that are premenopausal are recommended adjuvant chemotherapy.  Dr.  Earmon Phoenix recommendation is for her to either follow standard of care and received 4 cycles of Taxotere and Cytoxan adjuvantly followed by ovarian suppression and an aromatase inhibitor versus enrolling in the OFSET clinical trial which randomizes patient to chemotherapy plus ovarian suppression and aromatase inhibitor or ovarian suppression and aromatase inhibitors adjuvantly without chemotherapy.  She is going to speak with our research nurse cameo further about this today and will make her final decision today.  We will make follow-up appointments accordingly. Postsurgical pain: I sent in gabapentin at an increased dose.  She is to take 200 mg p.o. 3 times daily for 3 days and then increase to 300 mg p.o. 3 times daily.  I also sent in oxycodone #15 since she has 5 tablets left.  PMP aware reviewed no red flags.  I suggested that Aleve in the evening may help with some of the discomfort and inflammation. Next steps: She will either receive chemotherapy or go on clinical trial.  She will require adjuvant radiation in the future as well.  RTC in 2-3 weeks.     All questions were answered. The patient knows to call the clinic with any problems, questions or concerns. We can certainly see the patient much sooner if necessary.  Total encounter time:45 minutes*in face-to-face visit time, chart review, lab review, care coordination, order entry, and documentation of the encounter time.    Lillard Anes, NP  06/19/22 4:05 PM Medical Oncology and Hematology [facility] [address] [place], [place] [address] Tel. [phone]    Fax. [phone]  *Total Encounter Time as defined by the Centers for Medicare and Medicaid Services includes, in addition to the face-to-face time of a patient visit (documented in the note above) non-face-to-face time: obtaining and reviewing outside history, ordering and reviewing medications, tests or procedures, care coordination (communications with other  health care professionals or caregivers) and documentation in the medical record.

## 2022-06-19 NOTE — Telephone Encounter (Signed)
Called patient to schedule a FUN visit w. Alison. No answer, LVM for a return call.  

## 2022-06-20 ENCOUNTER — Telehealth: Payer: Self-pay | Admitting: Radiation Oncology

## 2022-06-20 ENCOUNTER — Encounter: Payer: Self-pay | Admitting: Hematology and Oncology

## 2022-06-20 ENCOUNTER — Encounter: Payer: Self-pay | Admitting: Rehabilitation

## 2022-06-20 ENCOUNTER — Ambulatory Visit (INDEPENDENT_AMBULATORY_CARE_PROVIDER_SITE_OTHER): Payer: BC Managed Care – PPO

## 2022-06-20 ENCOUNTER — Ambulatory Visit: Payer: BC Managed Care – PPO | Attending: General Surgery | Admitting: Rehabilitation

## 2022-06-20 ENCOUNTER — Encounter (HOSPITAL_COMMUNITY): Payer: Self-pay

## 2022-06-20 ENCOUNTER — Telehealth: Payer: Self-pay | Admitting: Hematology and Oncology

## 2022-06-20 DIAGNOSIS — J309 Allergic rhinitis, unspecified: Secondary | ICD-10-CM | POA: Diagnosis not present

## 2022-06-20 DIAGNOSIS — C50512 Malignant neoplasm of lower-outer quadrant of left female breast: Secondary | ICD-10-CM | POA: Diagnosis not present

## 2022-06-20 DIAGNOSIS — M25612 Stiffness of left shoulder, not elsewhere classified: Secondary | ICD-10-CM | POA: Insufficient documentation

## 2022-06-20 DIAGNOSIS — R293 Abnormal posture: Secondary | ICD-10-CM | POA: Diagnosis not present

## 2022-06-20 DIAGNOSIS — Z170 Estrogen receptor positive status [ER+]: Secondary | ICD-10-CM | POA: Diagnosis not present

## 2022-06-20 DIAGNOSIS — M25512 Pain in left shoulder: Secondary | ICD-10-CM | POA: Diagnosis not present

## 2022-06-20 NOTE — Telephone Encounter (Signed)
Called patient to schedule a FUN visit w. Alison. No answer, LVM for a return call.  

## 2022-06-20 NOTE — Telephone Encounter (Signed)
Called patient twice, left a message in regards to upcoming appointment time/dates.

## 2022-06-20 NOTE — Therapy (Signed)
OUTPATIENT PHYSICAL THERAPY BREAST CANCER POST OP FOLLOW UP   Patient Name: Mariah Burnett MRN: [id number] DOB:12-Mar-1975, 47 y.o., female Today's Date: 06/20/2022  END OF SESSION:  PT End of Session - 06/20/22 0859     Visit Number 4    Number of Visits 12    Date for PT Re-Evaluation 07/18/22    PT Start Time 0900    PT Stop Time 0940    PT Time Calculation (min) 40 min    Activity Tolerance Patient tolerated treatment well    Behavior During Therapy [facility] for tasks assessed/performed             Past Medical History:  Diagnosis Date   Arthritis    Asthma    Breast cancer (HCC) 04/24/2022   MVP (mitral valve prolapse)    Although no evidence per echo in 2009. Trace MR   Palpitation    Scoliosis    Urticaria    Vaginal Pap smear, abnormal    Past Surgical History:  Procedure Laterality Date   BREAST BIOPSY Left 04/24/2022   [place] LT BREAST BX W LOC DEV 1ST LESION IMG BX SPEC US GUIDE 04/24/2022 [facility]-ULTRASOUND   BREAST BIOPSY Left 04/24/2022   [place] LT BREAST BX W LOC DEV EA ADD LESION IMG BX SPEC US GUIDE 04/24/2022 [facility]-ULTRASOUND   BREAST BIOPSY Left 06/04/2022   [place] LT RADIOACTIVE SEED LOC 06/04/2022 [facility]AMMOGRAPHY   BREAST BIOPSY Left 06/04/2022   [place] LT RADIOACTIVE SEED EA ADD LESION 06/04/2022 [facility] MAMMOGRAPHY   BREAST LUMPECTOMY WITH RADIOACTIVE SEED AND SENTINEL LYMPH NODE BIOPSY Left 06/06/2022   Procedure: LEFT BREAST LUMPECTOMY WITH RAADIOACTIVE SEED AND SENTINEL LYMPH NODE BIOPSY;  Surgeon: Griselda Miner, MD;  Location: [facility];  Service: General;  Laterality: Left;   KIDNEY STONE SURGERY Bilateral 2000   RADIOACTIVE SEED GUIDED AXILLARY SENTINEL LYMPH NODE Left 06/06/2022   Procedure: RADIOACTIVE SEED GUIDED LEFT AXILLARY SENTINEL LYMPH NODE DISSECTION;  Surgeon: Griselda Miner, MD;  Location: [facility];  Service: General;  Laterality: Left;   WISDOM TOOTH EXTRACTION Bilateral 1991   Patient Active Problem List   Diagnosis Date  Noted   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 TOTH  REFERRING DIAG: C50.512 (ICD-10-CM) - Malignant neoplasm of lower-outer quadrant of left female breast Z17.0 (ICD-10-CM) - Estrogen receptor positive status (ER+)   THERAPY DIAG:  Stiffness of left shoulder, not elsewhere classified  Acute pain of left shoulder  Abnormal posture  Malignant neoplasm of lower-outer quadrant of left breast of female, estrogen receptor positive (HCC)  Rationale for Evaluation and Treatment: Rehabilitation  ONSET DATE: 04/24/22  SUBJECTIVE:  SUBJECTIVE STATEMENT: I had to get more gabapentin due to the nerve pain on the back of the arm.   PERTINENT HISTORY:  Lt lumpectomy due to [facility] 06/06/22. 1/4+ nodes. Chemo recommended. Radiation planned.   PATIENT GOALS:  Reassess how my recovery is going related to arm function, pain, and swelling.  PAIN:  Are you having pain? Yes: NPRS scale: 3/10 Pain location: back of the arm Pain description: nerve pain Aggravating factors: sleeping  Relieving factors: medication    PRECAUTIONS: Recent Surgery, left UE Lymphedema risk  ACTIVITY LEVEL / LEISURE: Not back to normal activities: I  haven't gotten back to walking, and other exercises. Not back to work yet 06/30/22.     OBJECTIVE:   PATIENT SURVEYS:  QUICK DASH: 45% from 59%   OBSERVATIONS: Still wearing compression.  Breast incisions look well healed with glue still present.  Slight general edema to the breast.   POSTURE:  Guarded   LYMPHEDEMA ASSESSMENT:   UPPER EXTREMITY AROM/PROM:   A/PROM RIGHT   eval 166  Shoulder extension 66  Shoulder flexion 166  Shoulder abduction 176  Shoulder internal rotation 59  Shoulder external rotation 95                          (Blank rows = not tested)   A/PROM LEFT   eval 06/20/22  Shoulder extension 41 40  Shoulder flexion 118 135 - pull in breast and arm  Shoulder abduction 149 150  Shoulder internal rotation Unable due to P!   Shoulder external rotation Unable due to P! 25 pn                          (Blank rows = not tested)   UPPER EXTREMITY STRENGTH: did not assess due to pain   LYMPHEDEMA ASSESSMENTS:    LANDMARK RIGHT   eval  10 cm proximal to olecranon process 27.2  Olecranon process 24.1  10 cm proximal to ulnar styloid process 20.7  Just proximal to ulnar styloid process 15  Across hand at thumb web space 18.5  At base of 2nd digit 5.5  (Blank rows = not tested)   LANDMARK LEFT   eval  10 cm proximal to olecranon process 27  Olecranon process 25  10 cm proximal to ulnar styloid process 20.2  Just proximal to ulnar styloid process 15  Across hand at thumb web space 18.2  At base of 2nd digit 5.5  (Blank rows = not tested)  TODAY'S TREATMENT: 06/20/22: Post op re-eval PROM into flexion, abduction, ER GH AP mob   PATIENT EDUCATION:  Education details: post op instruction and POC for frozen shoulder Person educated: Patient Education method: Explanation, Demonstration, and Handouts Education comprehension: verbalized understanding  HOME EXERCISE PROGRAM: Reviewed previously given post op HEP which includes frozen shoulder  stretches of: supine flexion, chest stretch, sleeper stretch, and wall walking  ASSESSMENT:  CLINICAL IMPRESSION: Pt is doing very well post lumpectomy and will return to PT visits to improve ROM due to frozen shoulder to prepare for radiation.   Pt will benefit from skilled therapeutic intervention to improve on the following deficits: Decreased knowledge of precautions, impaired UE functional use, pain, decreased ROM, postural dysfunction.   PT treatment/interventions: ADL/Self care home management, Therapeutic exercises, Patient/Family education, Self Care, Joint mobilization, DME instructions, Manual therapy, and Re-evaluation   GOALS: Goals reviewed with patient? Yes  LONG TERM GOALS:  (STG=LTG)  GOALS Name Target Date  Goal status  1 Pt will demonstrate she has regained full shoulder ROM and function post operatively compared to baselines.  Baseline: 06/20/22 - met but will aim for improvements for radiation INITIAL  2 Pt will improve Left shoulder ROM to obtain radiation position with comfort  NEW  3 Pt will be ind with final HEP for shoulder mobility   NEW          PLAN:  PT FREQUENCY/DURATION: 1-2x per week for 4 weeks  PLAN FOR NEXT SESSION: resume Lt frozen shoulder visits to prepare for radiation. Healed well from lumpectomy   [facility]  [address]  [place] [address]  [phone]  After Breast Cancer Class It is recommended you attend the ABC class to be educated on lymphedema risk reduction. This class is free of charge and lasts for 1 hour. It is a 1-time class. You will need to download the TEAMS app either on your phone or computer. We will send you a link the night before or the morning of the class. You should be able to click on that link to join the class. This is not a confidential class. You don't have to turn your camera on, but other participants may be able to see your email address.  Scar massage You can  begin gentle scar massage to you incision sites. Gently place one hand on the incision and move the skin (without sliding on the skin) in various directions. Do this for a few minutes and then you can gently massage either coconut oil or vitamin E cream into the scars.  Compression garment You should continue wearing your compression bra until you feel like you no longer have swelling.  Home exercise Program Continue doing the exercises you were given until you feel like you can do them without feeling any tightness at the end.   Walking Program Studies show that 30 minutes of walking per day (fast enough to elevate your heart rate) can significantly reduce the risk of a cancer recurrence. If you can't walk due to other medical reasons, we encourage you to find another activity you could do (like a stationary bike or water exercise).  Posture After breast cancer surgery, people frequently sit with rounded shoulders posture because it puts their incisions on slack and feels better. If you sit like this and scar tissue forms in that position, you can become very tight and have pain sitting or standing with good posture. Try to be aware of your posture and sit and stand up tall to heal properly.  Follow up PT: It is recommended you return every 3 months for the first 3 years following surgery to be assessed on the SOZO machine for an L-Dex score. This helps prevent clinically significant lymphedema in 95% of patients. These follow up screens are 10 minute appointments that you are not billed for.  Idamae Lusher, PT 06/20/2022, 10:55 AM

## 2022-06-23 ENCOUNTER — Telehealth: Payer: Self-pay | Admitting: Radiation Oncology

## 2022-06-23 NOTE — Telephone Encounter (Signed)
Called patient to schedule a FUN visit w. Jill Side. Patient stated she would like to receive treatments closer to home. Patient prefers to be treated in [place]. Referral closed until further notice.

## 2022-06-24 ENCOUNTER — Ambulatory Visit: Payer: BC Managed Care – PPO

## 2022-06-24 ENCOUNTER — Telehealth: Payer: Self-pay | Admitting: *Deleted

## 2022-06-24 ENCOUNTER — Other Ambulatory Visit: Payer: Self-pay | Admitting: *Deleted

## 2022-06-24 DIAGNOSIS — M25512 Pain in left shoulder: Secondary | ICD-10-CM | POA: Diagnosis not present

## 2022-06-24 DIAGNOSIS — R293 Abnormal posture: Secondary | ICD-10-CM | POA: Diagnosis not present

## 2022-06-24 DIAGNOSIS — Z170 Estrogen receptor positive status [ER+]: Secondary | ICD-10-CM

## 2022-06-24 DIAGNOSIS — M25612 Stiffness of left shoulder, not elsewhere classified: Secondary | ICD-10-CM | POA: Diagnosis not present

## 2022-06-24 DIAGNOSIS — C50512 Malignant neoplasm of lower-outer quadrant of left female breast: Secondary | ICD-10-CM | POA: Diagnosis not present

## 2022-06-24 NOTE — Therapy (Signed)
OUTPATIENT PHYSICAL THERAPY BREAST CANCER POST OP FOLLOW UP   Patient Name: Mariah Burnett MRN: [id number] DOB:06-13-1975, 47 y.o., female Today's Date: 06/24/2022  END OF SESSION:  PT End of Session - 06/24/22 1500     Visit Number 5    Number of Visits 12    Date for PT Re-Evaluation 07/18/22    PT Start Time 1501    PT Stop Time 1600    PT Time Calculation (min) 59 min    Activity Tolerance Patient tolerated treatment well    Behavior During Therapy [facility] for tasks assessed/performed             Past Medical History:  Diagnosis Date   Arthritis    Asthma    Breast cancer (HCC) 04/24/2022   MVP (mitral valve prolapse)    Although no evidence per echo in 2009. Trace MR   Palpitation    Scoliosis    Urticaria    Vaginal Pap smear, abnormal    Past Surgical History:  Procedure Laterality Date   BREAST BIOPSY Left 04/24/2022   [place] LT BREAST BX W LOC DEV 1ST LESION IMG BX SPEC US GUIDE 04/24/2022 [facility]   BREAST BIOPSY Left 04/24/2022   [place] LT BREAST BX W LOC DEV EA ADD LESION IMG BX SPEC US GUIDE 04/24/2022 [facility]   BREAST BIOPSY Left 06/04/2022   [place] LT RADIOACTIVE SEED LOC 06/04/2022 [facility]   BREAST BIOPSY Left 06/04/2022   [place] LT RADIOACTIVE SEED EA ADD LESION 06/04/2022 [facility]   BREAST LUMPECTOMY WITH RADIOACTIVE SEED AND SENTINEL LYMPH NODE BIOPSY Left 06/06/2022   Procedure: LEFT BREAST LUMPECTOMY WITH RAADIOACTIVE SEED AND SENTINEL LYMPH NODE BIOPSY;  Surgeon: Griselda Miner, MD;  Location: [facility];  Service: General;  Laterality: Left;   KIDNEY STONE SURGERY Bilateral 2000   RADIOACTIVE SEED GUIDED AXILLARY SENTINEL LYMPH NODE Left 06/06/2022   Procedure: RADIOACTIVE SEED GUIDED LEFT AXILLARY SENTINEL LYMPH NODE DISSECTION;  Surgeon: Griselda Miner, MD;  Location: [facility];  Service: General;  Laterality: Left;   WISDOM TOOTH EXTRACTION Bilateral 1991   Patient Active Problem List   Diagnosis Date  Noted   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 TOTH  REFERRING DIAG: C50.512 (ICD-10-CM) - Malignant neoplasm of lower-outer quadrant of left female breast Z17.0 (ICD-10-CM) - Estrogen receptor positive status (ER+)   THERAPY DIAG:  Stiffness of left shoulder, not elsewhere classified  Acute pain of left shoulder  Abnormal posture  Malignant neoplasm of lower-outer quadrant of left breast of female, estrogen receptor positive (HCC)  Rationale for Evaluation and Treatment: Rehabilitation  ONSET DATE: 04/24/22  SUBJECTIVE:  SUBJECTIVE STATEMENT: I started back to walking and I didn't have any swelling. I did the ABC class on Monday and that was good. Dr. Pamelia Hoit wants to get radiation set up soon. I meet with Dr. Pamelia Hoit next week about chemo. A lot of sensitivity at left medial arm and posterior arm is very numb. I take gabapentin for pain  PERTINENT HISTORY:  Lt lumpectomy due to [facility] 06/06/22. 1/4+ nodes. Chemo recommended. Radiation planned.   PATIENT GOALS:  Reassess how my recovery is going related to arm function, pain, and swelling.  PAIN:  Are you having pain? Yes: NPRS scale: 3/10 Pain location: back  of the arm Pain description: nerve pain Aggravating factors: sleeping  Relieving factors: medication    PRECAUTIONS: Recent Surgery, left UE Lymphedema risk  ACTIVITY LEVEL / LEISURE: Not back to normal activities: I haven't gotten back to walking, and other exercises. Not back to work yet 06/30/22.     OBJECTIVE:   PATIENT SURVEYS:  QUICK DASH: 45% from 59%   OBSERVATIONS: Still wearing compression.  Breast incisions look well healed with glue still present.  Slight general edema to the breast.   POSTURE:  Guarded   LYMPHEDEMA ASSESSMENT:   UPPER EXTREMITY AROM/PROM:   A/PROM RIGHT   eval 166  Shoulder extension 66  Shoulder flexion 166  Shoulder abduction 176  Shoulder internal rotation 59  Shoulder external rotation 95                          (Blank rows = not tested)   A/PROM LEFT   eval 06/20/22  Shoulder extension 41 40  Shoulder flexion 118 135 - pull in breast and arm  Shoulder abduction 149 150  Shoulder internal rotation Unable due to P!   Shoulder external rotation Unable due to P! 25 pn                          (Blank rows = not tested)   UPPER EXTREMITY STRENGTH: did not assess due to pain   LYMPHEDEMA ASSESSMENTS:    LANDMARK RIGHT   eval  10 cm proximal to olecranon process 27.2  Olecranon process 24.1  10 cm proximal to ulnar styloid process 20.7  Just proximal to ulnar styloid process 15  Across hand at thumb web space 18.5  At base of 2nd digit 5.5  (Blank rows = not tested)   LANDMARK LEFT   eval  10 cm proximal to olecranon process 27  Olecranon process 25  10 cm proximal to ulnar styloid process 20.2  Just proximal to ulnar styloid process 15  Across hand at thumb web space 18.2  At base of 2nd digit 5.5  (Blank rows = not tested)  TODAY'S TREATMENT:  06/24/2022 Clasped hands flexion and stargazer x 5 ea GH mobs post and inf gr 3/4 Supine wand flex and scaption x 4 Long arm distraction PROM left shoulder flexion, scaption,  abd, IR, ER with C-R stretch Left SL sleeper stretch for IR and ER Practiced abd wall slides and showed NTS with wrist oscillation Updated HEP with sleeper stretch and wall stretch for chest/NTS   06/20/22: Post op re-eval PROM into flexion, abduction, ER GH AP mob   PATIENT EDUCATION:  Education details: post op instruction and POC for frozen shoulder Person educated: Patient Education method: Explanation, Demonstration, and Handouts Education comprehension: verbalized understanding  HOME EXERCISE PROGRAM: Reviewed previously given post op  HEP which includes frozen shoulder stretches of: supine flexion, chest stretch, sleeper stretch, and wall walking  ASSESSMENT:  CLINICAL IMPRESSION: Pt with several small sensitive cords in the left axillary region and upper arm, crossing the elbow. Pt worked hard and had good tolerance for stretching today. Most limited with IR and ER.  Pt will benefit from skilled therapeutic intervention to improve on the following deficits: Decreased knowledge of precautions, impaired UE functional use, pain, decreased ROM, postural dysfunction.   PT treatment/interventions: ADL/Self care home management, Therapeutic exercises, Patient/Family education, Self Care, Joint mobilization, DME instructions, Manual therapy, and Re-evaluation   GOALS: Goals reviewed with patient? Yes  LONG TERM GOALS:  (STG=LTG)  GOALS Name Target Date  Goal status  1 Pt will demonstrate she has regained full shoulder ROM and function post operatively compared to baselines.  Baseline: 06/20/22 - met but will aim for improvements for radiation INITIAL  2 Pt will improve Left shoulder ROM to obtain radiation position with comfort  NEW  3 Pt will be ind with final HEP for shoulder mobility   NEW          PLAN:  PT FREQUENCY/DURATION: 1-2x per week for 4 weeks  PLAN FOR NEXT SESSION: resume Lt frozen shoulder visits to prepare for radiation. Healed well from lumpectomy, MFR  to axillary/upper arm cording, TG soft for cording?   [facility]  [address], [address]  [place] [address]  [phone]  After Breast Cancer Class It is recommended you attend the ABC class to be educated on lymphedema risk reduction. This class is free of charge and lasts for 1 hour. It is a 1-time class. You will need to download the TEAMS app either on your phone or computer. We will send you a link the night before or the morning of the class. You should be able to click on that link to join the class. This is not a confidential class. You don't have to turn your camera on, but other participants may be able to see your email address.  Scar massage You can begin gentle scar massage to you incision sites. Gently place one hand on the incision and move the skin (without sliding on the skin) in various directions. Do this for a few minutes and then you can gently massage either coconut oil or vitamin E cream into the scars.  Compression garment You should continue wearing your compression bra until you feel like you no longer have swelling.  Home exercise Program Continue doing the exercises you were given until you feel like you can do them without feeling any tightness at the end.   Walking Program Studies show that 30 minutes of walking per day (fast enough to elevate your heart rate) can significantly reduce the risk of a cancer recurrence. If you can't walk due to other medical reasons, we encourage you to find another activity you could do (like a stationary bike or water exercise).  Posture After breast cancer surgery, people frequently sit with rounded shoulders posture because it puts their incisions on slack and feels better. If you sit like this and scar tissue forms in that position, you can become very tight and have pain sitting or standing with good posture. Try to be aware of your posture and sit and stand up tall to heal properly.  Follow up  PT: It is recommended you return every 3 months for the first 3 years following surgery to be assessed on the [facility]  machine for an L-Dex score. This helps prevent clinically significant lymphedema in 95% of patients. These follow up screens are 10 minute appointments that you are not billed for.  Waynette Buttery, PT 06/24/2022, 5:46 PM

## 2022-06-24 NOTE — Telephone Encounter (Signed)
Referral faxed to Dr. Langston Masker in [place] for xrt. Per patient she would like to receive xrt in [place] b/c it's closer to home for her

## 2022-06-24 NOTE — Patient Instructions (Signed)

## 2022-06-26 ENCOUNTER — Telehealth: Payer: Self-pay | Admitting: Internal Medicine

## 2022-06-26 ENCOUNTER — Ambulatory Visit: Payer: BC Managed Care – PPO

## 2022-06-26 DIAGNOSIS — I341 Nonrheumatic mitral (valve) prolapse: Secondary | ICD-10-CM

## 2022-06-26 DIAGNOSIS — M25512 Pain in left shoulder: Secondary | ICD-10-CM

## 2022-06-26 DIAGNOSIS — M25612 Stiffness of left shoulder, not elsewhere classified: Secondary | ICD-10-CM

## 2022-06-26 DIAGNOSIS — C50512 Malignant neoplasm of lower-outer quadrant of left female breast: Secondary | ICD-10-CM | POA: Diagnosis not present

## 2022-06-26 DIAGNOSIS — R293 Abnormal posture: Secondary | ICD-10-CM | POA: Diagnosis not present

## 2022-06-26 DIAGNOSIS — Z170 Estrogen receptor positive status [ER+]: Secondary | ICD-10-CM | POA: Diagnosis not present

## 2022-06-26 NOTE — Telephone Encounter (Signed)
Pt called in stating she saw Dr. Carolynne Edouard today from [facility]. She states they want her to have an echo ordered to check her ejection fraction. Please advise.

## 2022-06-26 NOTE — Therapy (Signed)
OUTPATIENT PHYSICAL THERAPY BREAST CANCER TREATMENT   Patient Name: Mariah Burnett MRN: [id number] DOB:1975/09/27, 47 y.o., female Today's Date: 06/26/2022  END OF SESSION:  PT End of Session - 06/26/22 0911     Visit Number 6    Number of Visits 12    Date for PT Re-Evaluation 07/18/22    PT Start Time 0907    PT Stop Time 1005    PT Time Calculation (min) 58 min    Activity Tolerance Patient tolerated treatment well    Behavior During Therapy Mariah Burnett for tasks assessed/performed             Past Medical History:  Diagnosis Date   Arthritis    Asthma    Breast cancer (HCC) 04/24/2022   MVP (mitral valve prolapse)    Although no evidence per echo in 2009. Trace MR   Palpitation    Scoliosis    Urticaria    Vaginal Pap smear, abnormal    Past Surgical History:  Procedure Laterality Date   BREAST BIOPSY Left 04/24/2022   [place] LT BREAST BX W LOC DEV 1ST LESION IMG BX SPEC US GUIDE 04/24/2022 [facility]   BREAST BIOPSY Left 04/24/2022   [place] LT BREAST BX W LOC DEV EA ADD LESION IMG BX SPEC US GUIDE 04/24/2022 [facility]OUND   BREAST BIOPSY Left 06/04/2022   [place] LT RADIOACTIVE SEED LOC 06/04/2022 [facility]   BREAST BIOPSY Left 06/04/2022   [place] LT RADIOACTIVE SEED EA ADD LESION 06/04/2022 [facility]MO[facility]   BREAST LUMPECTOMY WITH RADIOACTIVE SEED AND SENTINEL LYMPH NODE BIOPSY Left 06/06/2022   Procedure: LEFT BREAST LUMPECTOMY WITH RAADIOACTIVE SEED AND SENTINEL LYMPH NODE BIOPSY;  Surgeon: Griselda Miner, MD;  Location: [facility];  Service: General;  Laterality: Left;   KIDNEY STONE SURGERY Bilateral 2000   RADIOACTIVE SEED GUIDED AXILLARY SENTINEL LYMPH NODE Left 06/06/2022   Procedure: RADIOACTIVE SEED GUIDED LEFT AXILLARY SENTINEL LYMPH NODE DISSECTION;  Surgeon: Griselda Miner, MD;  Location: [facility];  Service: General;  Laterality: Left;   WISDOM TOOTH EXTRACTION Bilateral 1991   Patient Active Problem List   Diagnosis Date Noted    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 TOTH  REFERRING DIAG: C50.512 (ICD-10-CM) - Malignant neoplasm of lower-outer quadrant of left female breast Z17.0 (ICD-10-CM) - Estrogen receptor positive status (ER+)   THERAPY DIAG:  Stiffness of left shoulder, not elsewhere classified  Acute pain of left shoulder  Abnormal posture  Malignant neoplasm of lower-outer quadrant of left breast of female, estrogen receptor positive (HCC)  Rationale for Evaluation and Treatment: Rehabilitation  ONSET DATE: 04/24/22  SUBJECTIVE:  SUBJECTIVE STATEMENT: The cording did feel a bit better after [name] worked on it last time. It's just still so sensitive.   PERTINENT HISTORY:  Lt lumpectomy due to [facility] 06/06/22. 1/4+ nodes. Chemo recommended. Radiation planned.   PATIENT GOALS:  Reassess how my recovery is going related to arm function, pain, and swelling.  PAIN:  Are you having pain? Yes: NPRS scale: 3/10 Pain location: back of the arm Pain description: nerve pain Aggravating factors: sleeping  Relieving factors: medication    PRECAUTIONS: Recent Surgery, left UE Lymphedema risk  ACTIVITY LEVEL / LEISURE: Not back to  normal activities: I haven't gotten back to walking, and other exercises. Not back to work yet 06/30/22.     OBJECTIVE:   PATIENT SURVEYS:  QUICK DASH: 45% from 59%   OBSERVATIONS: Still wearing compression.  Breast incisions look well healed with glue still present.  Slight general edema to the breast.   POSTURE:  Guarded   LYMPHEDEMA ASSESSMENT:   UPPER EXTREMITY AROM/PROM:   A/PROM RIGHT   eval 166  Shoulder extension 66  Shoulder flexion 166  Shoulder abduction 176  Shoulder internal rotation 59  Shoulder external rotation 95                          (Blank rows = not tested)   A/PROM LEFT   eval 06/20/22  Shoulder extension 41 40  Shoulder flexion 118 135 - pull in breast and arm  Shoulder abduction 149 150  Shoulder internal rotation Unable due to P!   Shoulder external rotation Unable due to P! 25 pn                          (Blank rows = not tested)   UPPER EXTREMITY STRENGTH: did not assess due to pain   LYMPHEDEMA ASSESSMENTS:    LANDMARK RIGHT   eval  10 cm proximal to olecranon process 27.2  Olecranon process 24.1  10 cm proximal to ulnar styloid process 20.7  Just proximal to ulnar styloid process 15  Across hand at thumb web space 18.5  At base of 2nd digit 5.5  (Blank rows = not tested)   LANDMARK LEFT   eval  10 cm proximal to olecranon process 27  Olecranon process 25  10 cm proximal to ulnar styloid process 20.2  Just proximal to ulnar styloid process 15  Across hand at thumb web space 18.2  At base of 2nd digit 5.5  (Blank rows = not tested)  TODAY'S TREATMENT:  06/26/22: Manual Therapy P/ROM in supine to Lt shoulder into flex, abd and D2 to pts tolerance and with scapular depression applied by therapist throughout; multiple VC's for pt to relax due to muscle guarding MFR to Lt axilla and medial upper arm  STM to Lt medial scapular border and UT in Rt S/L with cocoa butter Scap Mobs in Rt S/L to Lt scapula into protraction and  retraction  06/24/2022 Clasped hands flexion and stargazer x 5 ea GH mobs post and inf gr 3/4 Supine wand flex and scaption x 4 Long arm distraction PROM left shoulder flexion, scaption, abd, IR, ER with C-R stretch Left SL sleeper stretch for IR and ER Practiced abd wall slides and showed NTS with wrist oscillation Updated HEP with sleeper stretch and wall stretch for chest/NTS   06/20/22: Post op re-eval PROM into flexion, abduction, ER GH AP mob   PATIENT EDUCATION:  Education details: post op instruction and POC for frozen shoulder Person educated: Patient Education method: Explanation, Demonstration, and Handouts Education comprehension: verbalized understanding  HOME EXERCISE PROGRAM: Reviewed previously given post op HEP which includes frozen shoulder stretches of: supine flexion, chest stretch, sleeper stretch, and wall walking  ASSESSMENT:  CLINICAL IMPRESSION: Continued with manual therapy today making this the focus of todays session as pt is doing well with current HEP for stretching. Did review neural tension stretch on wall before she left per her request to assess technique. Cording was not visibly but was mildly palpable. Pt reports still feeling hypersensitive at medial upper arm during day due to cording, though this did feel some improved after last session. Issued size small TG soft for pt to try wearing on Lt UE to see if this will help with her cording symptoms. Encouraged pt to wait at least 1 more week before beginning scar tissue massage as her SLNB incision still has 2 small areas of scab but overall are healing very well. She verbalized understanding.   Pt will benefit from skilled therapeutic intervention to improve on the following deficits: Decreased knowledge of precautions, impaired UE functional use, pain, decreased ROM, postural dysfunction.   PT treatment/interventions: ADL/Self care home management, Therapeutic exercises, Patient/Family education,  Self Care, Joint mobilization, DME instructions, Manual therapy, and Re-evaluation   GOALS: Goals reviewed with patient? Yes  LONG TERM GOALS:  (STG=LTG)  GOALS Name Target Date  Goal status  1 Pt will demonstrate she has regained full shoulder ROM and function post operatively compared to baselines.  Baseline: 06/20/22 - met but will aim for improvements for radiation INITIAL  2 Pt will improve Left shoulder ROM to obtain radiation position with comfort  NEW  3 Pt will be ind with final HEP for shoulder mobility   NEW          PLAN:  PT FREQUENCY/DURATION: 1-2x per week for 4 weeks  PLAN FOR NEXT SESSION: resume Lt frozen shoulder visits to prepare for radiation. Healed well from lumpectomy, MFR to axillary/upper arm cording, TG soft for cording?   [facility]  [address], [address]  [place] [address]  [phone]    Hermenia Bers, PTA 06/26/2022, 10:33 AM

## 2022-06-26 NOTE — Telephone Encounter (Signed)
Left a message for patient to call office back regarding testing.  Echo ordered.

## 2022-06-26 NOTE — Telephone Encounter (Signed)
Spoke with pt who states that she was Dx with breast cancer in April. She states that oncologist is inquiring about her heart health prior to radiation treatment. Pt would like to know if she needs to have any test done to provide this information? Please advise.

## 2022-06-27 ENCOUNTER — Other Ambulatory Visit: Payer: Self-pay | Admitting: *Deleted

## 2022-06-27 ENCOUNTER — Encounter: Payer: Self-pay | Admitting: *Deleted

## 2022-06-30 ENCOUNTER — Other Ambulatory Visit: Payer: BC Managed Care – PPO

## 2022-06-30 ENCOUNTER — Encounter: Payer: BC Managed Care – PPO | Admitting: Genetic Counselor

## 2022-06-30 ENCOUNTER — Ambulatory Visit: Payer: BC Managed Care – PPO | Admitting: Rehabilitation

## 2022-06-30 ENCOUNTER — Ambulatory Visit: Payer: BC Managed Care – PPO | Admitting: Physical Therapy

## 2022-06-30 ENCOUNTER — Encounter: Payer: Self-pay | Admitting: Rehabilitation

## 2022-06-30 DIAGNOSIS — Z170 Estrogen receptor positive status [ER+]: Secondary | ICD-10-CM

## 2022-06-30 DIAGNOSIS — R293 Abnormal posture: Secondary | ICD-10-CM | POA: Diagnosis not present

## 2022-06-30 DIAGNOSIS — C50512 Malignant neoplasm of lower-outer quadrant of left female breast: Secondary | ICD-10-CM | POA: Diagnosis not present

## 2022-06-30 DIAGNOSIS — M25512 Pain in left shoulder: Secondary | ICD-10-CM

## 2022-06-30 DIAGNOSIS — M25612 Stiffness of left shoulder, not elsewhere classified: Secondary | ICD-10-CM

## 2022-06-30 NOTE — Telephone Encounter (Signed)
Left a message for patient to call office back regarding testing.     MyChart message sent

## 2022-06-30 NOTE — Therapy (Signed)
OUTPATIENT PHYSICAL THERAPY BREAST CANCER TREATMENT   Patient Name: Mariah Burnett MRN: [id number] DOB:April 13, 1975, 47 y.o., female Today's Date: 06/30/2022  END OF SESSION:  PT End of Session - 06/30/22 1556     Visit Number 7    Number of Visits 12    Date for PT Re-Evaluation 07/18/22    PT Start Time 1600    PT Stop Time 1650    PT Time Calculation (min) 50 min    Activity Tolerance Patient tolerated treatment well    Behavior During Therapy [facility] for tasks assessed/performed             Past Medical History:  Diagnosis Date   Arthritis    Asthma    Breast cancer (HCC) 04/24/2022   MVP (mitral valve prolapse)    Although no evidence per echo in 2009. Trace MR   Palpitation    Scoliosis    Urticaria    Vaginal Pap smear, abnormal    Past Surgical History:  Procedure Laterality Date   BREAST BIOPSY Left 04/24/2022   [place] LT BREAST BX W LOC DEV 1ST LESION IMG BX SPEC US GUIDE 04/24/2022 [facility]   BREAST BIOPSY Left 04/24/2022   [place] LT BREAST BX W LOC DEV EA ADD LESION IMG BX SPEC US GUIDE 04/24/2022 [facility]ASOUND   BREAST BIOPSY Left 06/04/2022   [place] LT RADIOACTIVE SEED LOC 06/04/2022 [facility]   BREAST BIOPSY Left 06/04/2022   [place] LT RADIOACTIVE SEED EA ADD LESION 06/04/2022 [facility]   BREAST LUMPECTOMY WITH RADIOACTIVE SEED AND SENTINEL LYMPH NODE BIOPSY Left 06/06/2022   Procedure: LEFT BREAST LUMPECTOMY WITH RAADIOACTIVE SEED AND SENTINEL LYMPH NODE BIOPSY;  Surgeon: Griselda Miner, MD;  Location: [facility];  Service: General;  Laterality: Left;   KIDNEY STONE SURGERY Bilateral 2000   RADIOACTIVE SEED GUIDED AXILLARY SENTINEL LYMPH NODE Left 06/06/2022   Procedure: RADIOACTIVE SEED GUIDED LEFT AXILLARY SENTINEL LYMPH NODE DISSECTION;  Surgeon: Griselda Miner, MD;  Location: [facility];  Service: General;  Laterality: Left;   WISDOM TOOTH EXTRACTION Bilateral 1991   Patient Active Problem List   Diagnosis Date Noted    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 TOTH  REFERRING DIAG: C50.512 (ICD-10-CM) - Malignant neoplasm of lower-outer quadrant of left female breast Z17.0 (ICD-10-CM) - Estrogen receptor positive status (ER+)   THERAPY DIAG:  Stiffness of left shoulder, not elsewhere classified  Acute pain of left shoulder  Abnormal posture  Malignant neoplasm of lower-outer quadrant of left breast of female, estrogen receptor positive (HCC)  Rationale for Evaluation and Treatment: Rehabilitation  ONSET DATE: 04/24/22  SUBJECTIVE:  SUBJECTIVE STATEMENT: It just is sore all the time  PERTINENT HISTORY:  Lt lumpectomy due to [facility] 06/06/22. 1/4+ nodes. Radiation planned.   PATIENT GOALS:  Reassess how my recovery is going related to arm function, pain, and swelling.  PAIN:  Are you having pain? Yes: NPRS scale: 3/10 Pain location: back of the arm Pain description: nerve pain Aggravating factors: sleeping  Relieving factors: medication    PRECAUTIONS: Recent Surgery, left UE Lymphedema risk  ACTIVITY LEVEL / LEISURE: Not back to normal activities: I haven't gotten back to walking, and other exercises. Not back to work yet  06/30/22.     OBJECTIVE:   PATIENT SURVEYS:  QUICK DASH: 45% from 59%   OBSERVATIONS: Still wearing compression.  Breast incisions look well healed with glue still present.  Slight general edema to the breast.   POSTURE:  Guarded   LYMPHEDEMA ASSESSMENT:   UPPER EXTREMITY AROM/PROM:   A/PROM RIGHT   eval 166  Shoulder extension 66  Shoulder flexion 166  Shoulder abduction 176  Shoulder internal rotation 59  Shoulder external rotation 95                          (Blank rows = not tested)   A/PROM LEFT   eval 06/20/22 06/30/22  Shoulder extension 41 40 35  Shoulder flexion 118 135 - pull in breast and arm 155  Shoulder abduction 149 150 164  Shoulder internal rotation Unable due to P!    Shoulder external rotation Unable due to P! 25 pn                           (Blank rows = not tested)   UPPER EXTREMITY STRENGTH: did not assess due to pain   LYMPHEDEMA ASSESSMENTS:    LANDMARK RIGHT   eval  10 cm proximal to olecranon process 27.2  Olecranon process 24.1  10 cm proximal to ulnar styloid process 20.7  Just proximal to ulnar styloid process 15  Across hand at thumb web space 18.5  At base of 2nd digit 5.5  (Blank rows = not tested)   LANDMARK LEFT   eval  10 cm proximal to olecranon process 27  Olecranon process 25  10 cm proximal to ulnar styloid process 20.2  Just proximal to ulnar styloid process 15  Across hand at thumb web space 18.2  At base of 2nd digit 5.5  (Blank rows = not tested)  TODAY'S TREATMENT: 06/30/2022 Pulleys flexion and abduction Wall ball flexion and abduction x 5 each Extension dowel x 6  Dowel up the back x 6  GH mobs post and inf gr 3/4 at various angles of ER and flexion Long arm distraction PROM left shoulder flexion, scaption, abd, IR, ER Sidelying active ER with manual assist Cording release in the axilila and upper arm  06/26/22: Manual Therapy P/ROM in supine to Lt shoulder into flex, abd and D2 to pts tolerance and  with scapular depression applied by therapist throughout; multiple VC's for pt to relax due to muscle guarding MFR to Lt axilla and medial upper arm  STM to Lt medial scapular border and UT in Rt S/L with cocoa butter Scap Mobs in Rt S/L to Lt scapula into protraction and retraction  06/24/2022 Clasped hands flexion and stargazer x 5 ea GH mobs post and inf gr 3/4 Supine wand flex and scaption x 4 Long arm distraction PROM left shoulder  flexion, scaption, abd, IR, ER with C-R stretch Left SL sleeper stretch for IR and ER Practiced abd wall slides and showed NTS with wrist oscillation Updated HEP with sleeper stretch and wall stretch for chest/NTS   06/20/22: Post op re-eval PROM into flexion, abduction, ER GH AP mob   PATIENT EDUCATION:  Education details: post op instruction and POC for frozen shoulder Person educated: Patient Education method: Explanation, Demonstration, and Handouts Education comprehension: verbalized understanding  HOME EXERCISE PROGRAM: Reviewed previously given post op HEP which includes frozen shoulder stretches of: supine flexion, chest stretch, sleeper stretch, and wall walking  ASSESSMENT:  CLINICAL IMPRESSION: Pt reports still feeling hypersensitive at medial upper arm during day due to cording.  Pt did not notice much of a difference with the tg soft. We talked about maybe doing the SOZO early to make sure she doesn't need a full sleeve. No scabbing noted on the incision today.  Pt is now 10deg away from the opposite UE in regards to flexion and abduction but is limited into extension, IR, and ER with cording and frozen shoulder.   Pt will benefit from skilled therapeutic intervention to improve on the following deficits: Decreased knowledge of precautions, impaired UE functional use, pain, decreased ROM, postural dysfunction.   PT treatment/interventions: ADL/Self care home management, Therapeutic exercises, Patient/Family education, Self Care, Joint  mobilization, DME instructions, Manual therapy, and Re-evaluation   GOALS: Goals reviewed with patient? Yes  LONG TERM GOALS:  (STG=LTG)  GOALS Name Target Date  Goal status  1 Pt will demonstrate she has regained full shoulder ROM and function post operatively compared to baselines.  Baseline: 06/20/22 - met but will aim for improvements for radiation INITIAL  2 Pt will improve Left shoulder ROM to obtain radiation position with comfort  NEW  3 Pt will be ind with final HEP for shoulder mobility   NEW          PLAN:  PT FREQUENCY/DURATION: 1-2x per week for 4 weeks  PLAN FOR NEXT SESSION: do sozo? resume Lt frozen shoulder visits to prepare for radiation. Healed well from lumpectomy, MFR to axillary/upper arm cording   [facility]  [address]  [place] [address]  [phone]    Idamae Lusher, PT 06/30/2022, 4:53 PM

## 2022-07-02 ENCOUNTER — Ambulatory Visit: Payer: BC Managed Care – PPO | Admitting: Rehabilitation

## 2022-07-02 ENCOUNTER — Encounter: Payer: Self-pay | Admitting: Rehabilitation

## 2022-07-02 ENCOUNTER — Ambulatory Visit (INDEPENDENT_AMBULATORY_CARE_PROVIDER_SITE_OTHER): Payer: BC Managed Care – PPO

## 2022-07-02 DIAGNOSIS — M25612 Stiffness of left shoulder, not elsewhere classified: Secondary | ICD-10-CM | POA: Diagnosis not present

## 2022-07-02 DIAGNOSIS — Z170 Estrogen receptor positive status [ER+]: Secondary | ICD-10-CM

## 2022-07-02 DIAGNOSIS — C50512 Malignant neoplasm of lower-outer quadrant of left female breast: Secondary | ICD-10-CM | POA: Diagnosis not present

## 2022-07-02 DIAGNOSIS — M25512 Pain in left shoulder: Secondary | ICD-10-CM | POA: Diagnosis not present

## 2022-07-02 DIAGNOSIS — J309 Allergic rhinitis, unspecified: Secondary | ICD-10-CM | POA: Diagnosis not present

## 2022-07-02 DIAGNOSIS — R293 Abnormal posture: Secondary | ICD-10-CM | POA: Diagnosis not present

## 2022-07-02 NOTE — Therapy (Signed)
OUTPATIENT PHYSICAL THERAPY BREAST CANCER TREATMENT   Patient Name: Mariah Burnett MRN: [id number] DOB:[birth year], 47 y.o., female Today's Date: 07/02/2022  END OF SESSION:  PT End of Session - 07/02/22 1454     Visit Number 7    Number of Visits 12    Date for PT Re-Evaluation 07/18/22    PT Start Time 1500    PT Stop Time 1558    PT Time Calculation (min) 58 min    Activity Tolerance Patient tolerated treatment well    Behavior During Therapy [facility] for tasks assessed/performed             Past Medical History:  Diagnosis Date   Arthritis    Asthma    Breast cancer (HCC) 04/24/2022   MVP (mitral valve prolapse)    Although no evidence per echo in 2009. Trace MR   Palpitation    Scoliosis    Urticaria    Vaginal Pap smear, abnormal    Past Surgical History:  Procedure Laterality Date   BREAST BIOPSY Left 04/24/2022   [place] LT BREAST BX W LOC DEV 1ST LESION IMG BX SPEC US GUIDE 04/24/2022 [facility]   BREAST BIOPSY Left 04/24/2022   [place] LT BREAST BX W LOC DEV EA ADD LESION IMG BX SPEC US GUIDE 04/24/2022 [facility]   BREAST BIOPSY Left 06/04/2022   [place] LT RADIOACTIVE SEED LOC 06/04/2022 [facility]   BREAST BIOPSY Left 06/04/2022   [place] LT RADIOACTIVE SEED EA ADD LESION 06/04/2022 [facility]MO[facility]   BREAST LUMPECTOMY WITH RADIOACTIVE SEED AND SENTINEL LYMPH NODE BIOPSY Left 06/06/2022   Procedure: LEFT BREAST LUMPECTOMY WITH RAADIOACTIVE SEED AND SENTINEL LYMPH NODE BIOPSY;  Surgeon: Griselda Miner, MD;  Location: [facility];  Service: General;  Laterality: Left;   KIDNEY STONE SURGERY Bilateral 2000   RADIOACTIVE SEED GUIDED AXILLARY SENTINEL LYMPH NODE Left 06/06/2022   Procedure: RADIOACTIVE SEED GUIDED LEFT AXILLARY SENTINEL LYMPH NODE DISSECTION;  Surgeon: Griselda Miner, MD;  Location:  [facility];  Service: General;  Laterality: Left;   WISDOM TOOTH EXTRACTION Bilateral 1991   Patient Active Problem List   Diagnosis Date Noted    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 TOTH  REFERRING DIAG: C50.512 (ICD-10-CM) - Malignant neoplasm of lower-outer quadrant of left female breast Z17.0 (ICD-10-CM) - Estrogen receptor positive status (ER+)   THERAPY DIAG:  Stiffness of left shoulder, not elsewhere classified  Acute pain of left shoulder  Abnormal posture  Malignant neoplasm of lower-outer quadrant of left breast of female, estrogen receptor positive (HCC)  Rationale for Evaluation and Treatment: Rehabilitation  ONSET DATE: 04/24/22  SUBJECTIVE:  SUBJECTIVE STATEMENT: I like the new exercises  PERTINENT HISTORY:  Lt lumpectomy due to [facility] 06/06/22. 1/4+ nodes. Radiation planned.   PATIENT GOALS:  Reassess how my recovery is going related to arm function, pain, and swelling.  PAIN:  Are you having pain? Yes: NPRS scale: 3/10 Pain location: back of the arm Pain description: nerve pain Aggravating factors: sleeping  Relieving factors: medication    PRECAUTIONS: Recent Surgery, left UE Lymphedema risk  ACTIVITY LEVEL / LEISURE: Not back to normal activities: I haven't gotten back to walking, and other exercises. Not back to work yet  06/30/22.     OBJECTIVE:   PATIENT SURVEYS:  QUICK DASH: 45% from 59%   OBSERVATIONS: Still wearing compression.  Breast incisions look well healed with glue still present.  Slight general edema to the breast.   POSTURE:  Guarded   LYMPHEDEMA ASSESSMENT:   UPPER EXTREMITY AROM/PROM:   A/PROM RIGHT   eval 166  Shoulder extension 66  Shoulder flexion 166  Shoulder abduction 176  Shoulder internal rotation 59  Shoulder external rotation 95                          (Blank rows = not tested)   A/PROM LEFT   eval 06/20/22 06/30/22  Shoulder extension 41 40 35  Shoulder flexion 118 135 - pull in breast and arm 155  Shoulder abduction 149 150 164  Shoulder internal rotation Unable due to P!    Shoulder external rotation Unable due to P! 25 pn                           (Blank rows = not tested)   UPPER EXTREMITY STRENGTH: did not assess due to pain   LYMPHEDEMA ASSESSMENTS:    LANDMARK RIGHT   eval  10 cm proximal to olecranon process 27.2  Olecranon process 24.1  10 cm proximal to ulnar styloid process 20.7  Just proximal to ulnar styloid process 15  Across hand at thumb web space 18.5  At base of 2nd digit 5.5  (Blank rows = not tested)   LANDMARK LEFT   eval  10 cm proximal to olecranon process 27  Olecranon process 25  10 cm proximal to ulnar styloid process 20.2  Just proximal to ulnar styloid process 15  Across hand at thumb web space 18.2  At base of 2nd digit 5.5  (Blank rows = not tested)  TODAY'S TREATMENT: 07/02/2022 Pulleys flexion and abduction x each  Wall ball flexion and abduction x 5 each Supine Dowel flexion x 6 Bil alternation horizontal abduction x 6 aiming to hit the pillow as the table was too far  Alternating overhead flexion x 6  Pro/ret x 6  Active abd into behind the back x 10 GH mobs post and inf gr 3/4 at various angles of ER and flexion Long arm distraction PROM left shoulder flexion, scaption, abd, IR, ER Sidelying active  ER with manual assist Cording release in the axilila and upper arm Re-did SOZO early due to cording and sensations at the upper arm and it had no change.   06/30/2022 Pulleys flexion and abduction Wall ball flexion and abduction x 5 each Extension dowel x 6  Dowel up the back x 6  GH mobs post and inf gr 3/4 at various angles of ER and flexion Long arm distraction PROM left shoulder flexion, scaption, abd, IR, ER Sidelying  active ER with manual assist Cording release in the axilila and upper arm  06/26/22: Manual Therapy P/ROM in supine to Lt shoulder into flex, abd and D2 to pts tolerance and with scapular depression applied by therapist throughout; multiple VC's for pt to relax due to muscle guarding MFR to Lt axilla and medial upper arm  STM to Lt medial scapular border and UT in Rt S/L with cocoa butter Scap Mobs in Rt S/L to Lt scapula into protraction and retraction  06/24/2022 Clasped hands flexion and stargazer x 5 ea GH mobs post and inf gr 3/4 Supine wand flex and scaption x 4 Long arm distraction PROM left shoulder flexion, scaption, abd, IR, ER with C-R stretch Left SL sleeper stretch for IR and ER Practiced abd wall slides and showed NTS with wrist oscillation Updated HEP with sleeper stretch and wall stretch for chest/NTS   06/20/22: Post op re-eval PROM into flexion, abduction, ER GH AP mob   PATIENT EDUCATION:  Education details: post op instruction and POC for frozen shoulder Person educated: Patient Education method: Explanation, Demonstration, and Handouts Education comprehension: verbalized understanding  HOME EXERCISE PROGRAM: Reviewed previously given post op HEP which includes frozen shoulder stretches of: supine flexion, chest stretch, sleeper stretch, and wall walking  ASSESSMENT:  CLINICAL IMPRESSION:  Pt is much improved today after starting more extension and IR stretching at home.  She is still very limited into ER with pinching.  The cording  is also improving and becoming less sensitive.    Pt will benefit from skilled therapeutic intervention to improve on the following deficits: Decreased knowledge of precautions, impaired UE functional use, pain, decreased ROM, postural dysfunction.   PT treatment/interventions: ADL/Self care home management, Therapeutic exercises, Patient/Family education, Self Care, Joint mobilization, DME instructions, Manual therapy, and Re-evaluation   GOALS: Goals reviewed with patient? Yes  LONG TERM GOALS:  (STG=LTG)  GOALS Name Target Date  Goal status  1 Pt will demonstrate she has regained full shoulder ROM and function post operatively compared to baselines.  Baseline: 06/20/22 - met but will aim for improvements for radiation INITIAL  2 Pt will improve Left shoulder ROM to obtain radiation position with comfort  NEW  3 Pt will be ind with final HEP for shoulder mobility   NEW          PLAN:  PT FREQUENCY/DURATION: 1-2x per week for 4 weeks  PLAN FOR NEXT SESSION: resume Lt frozen shoulder visits to prepare for radiation. Healed well from lumpectomy, MFR to axillary/upper arm cording   [facility]  [address], [address]  [place] [address]  [phone]    Idamae Lusher, PT 07/02/2022, 4:00 PM

## 2022-07-05 NOTE — Progress Notes (Signed)
Patient Care Team: Billie Lade, MD as PCP - General (Internal Medicine) Pershing Proud, RN as Oncology Nurse Navigator Donnelly Angelica, RN as Oncology Nurse Navigator Serena Croissant, MD as Consulting Physician (Hematology and Oncology)  DIAGNOSIS: No diagnosis found.  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    06/06/2022 Oncotype testing   3/2%   06/06/2022 Cancer Staging   Staging form: Breast, AJCC 8th Edition - Pathologic stage from 06/06/2022: Stage IA (pT1c, pN1a, cM0, G2, ER+, PR+, HER2-, Oncotype DX score: 3) - Signed by Loa Socks, NP on 06/19/2022 Stage prefix: Initial diagnosis Multigene prognostic tests performed: Oncotype DX Recurrence score range: Less than 11 Histologic grading system: 3 grade system     CHIEF COMPLIANT: Follow-up after surgery  INTERVAL HISTORY: Mariah Burnett is a 47 y.o. female is here because of recent diagnosis of left breast cancer. She presents to the clinic for a follow-up.   ALLERGIES:  is allergic to latex.  MEDICATIONS:  Current Outpatient Medications  Medication Sig Dispense Refill   acyclovir (ZOVIRAX) 200 MG capsule Take 400 mg by mouth 3 (three) times daily.     Azelastine HCl 137 MCG/SPRAY  SOLN Place 2 sprays into the nose 2 (two) times daily as needed. 90 mL 4   desloratadine (CLARINEX) 5 MG tablet Take one tablet once daily as needed for runny nose or itchy eyes. 90 tablet 1   gabapentin (NEURONTIN) 100 MG capsule Take 2-3 capsules (200-300 mg total) by mouth 3 (three) times daily. Take 200mg  TID x at least three days then increase to 300mg  TID. 270 capsule 0   hydrOXYzine (ATARAX) 25 MG tablet Take 25 mg by mouth every 8 (eight) hours as needed.     levalbuterol (XOPENEX HFA) 45 MCG/ACT inhaler Inhale 2 puffs into the lungs every 6 (six) hours as needed for wheezing. 15 g 1   meloxicam (MOBIC) 15 MG tablet Take 1 tablet (15 mg total) by mouth daily as needed. Take 15 mg by mouth daily as needed. 30 tablet 0   metoprolol tartrate (LOPRESSOR) 25 MG tablet TAKE 1/2 TABLET TWICE A DAY 90 tablet 3   Multiple Vitamin (MULTIVITAMIN) tablet Take 1 tablet by mouth daily.     oxyCODONE (ROXICODONE) 5 MG immediate release tablet Take 1 tablet (5 mg total) by mouth every 6 (six) hours as needed for severe pain. 15 tablet 0   No current facility-administered medications for this visit.    PHYSICAL EXAMINATION: ECOG PERFORMANCE STATUS: {CHL ONC ECOG [phone]}  There were no vitals filed for this visit. There were no vitals filed for this visit.  BREAST:*** No palpable masses or nodules in either right or left breasts. No palpable axillary supraclavicular or infraclavicular adenopathy no breast tenderness or nipple discharge. (exam  performed in the presence of a chaperone)  LABORATORY DATA:  I have reviewed the data as listed    Latest Ref Rng & Units 04/05/2019   12:47 PM 06/26/2017   10:00 AM 08/20/2015    7:09 PM  CMP  Glucose 70 - 99 mg/dL 96  97  94   BUN 6 - 20 mg/dL 13  12  10    Creatinine 0.44 - 1.00 mg/dL 6.21  3.08  6.57   Sodium 135 - 145 mmol/L 136  139  136   Potassium 3.5 - 5.1 mmol/L 3.4  4.0  3.8   Chloride 98 - 111 mmol/L 103  107  102   CO2 22 - 32 mmol/L 24   26  27    Calcium 8.9 - 10.3 mg/dL 9.3  9.2  9.6   Total Protein 6.5 - 8.1 g/dL   7.9   Total Bilirubin 0.3 - 1.2 mg/dL   1.3   Alkaline Phos 38 - 126 U/L   37   AST 15 - 41 U/L   20   ALT 14 - 54 U/L   13     Lab Results  Component Value Date   WBC 5.4 04/05/2019   HGB 13.6 04/05/2019   HCT 40.2 04/05/2019   MCV 95.3 04/05/2019   PLT 202 04/05/2019   NEUTROABS 3.5 06/26/2017    ASSESSMENT & PLAN:  No problem-specific Assessment & Plan notes found for this encounter.    No orders of the defined types were placed in this encounter.  The patient has a good understanding of the overall plan. she agrees with it. she will call with any problems that may develop before the next visit here. Total time spent: 30 mins including face to face time and time spent for planning, charting and co-ordination of care   Sherlyn Lick, CMA 07/05/22    I Janan Ridge am acting as a Neurosurgeon for [employer]  ***

## 2022-07-07 ENCOUNTER — Ambulatory Visit: Payer: BC Managed Care – PPO | Attending: General Surgery

## 2022-07-07 DIAGNOSIS — C50512 Malignant neoplasm of lower-outer quadrant of left female breast: Secondary | ICD-10-CM | POA: Diagnosis not present

## 2022-07-07 DIAGNOSIS — Z170 Estrogen receptor positive status [ER+]: Secondary | ICD-10-CM | POA: Insufficient documentation

## 2022-07-07 DIAGNOSIS — M25612 Stiffness of left shoulder, not elsewhere classified: Secondary | ICD-10-CM | POA: Diagnosis not present

## 2022-07-07 DIAGNOSIS — M25512 Pain in left shoulder: Secondary | ICD-10-CM | POA: Diagnosis not present

## 2022-07-07 DIAGNOSIS — R293 Abnormal posture: Secondary | ICD-10-CM | POA: Insufficient documentation

## 2022-07-07 NOTE — Therapy (Signed)
OUTPATIENT PHYSICAL THERAPY BREAST CANCER TREATMENT   Patient Name: Mariah Burnett MRN: [id number] DOB:[birth year], 47 y.o., female Today's Date: 07/07/2022  END OF SESSION:  PT End of Session - 07/07/22 1601     Visit Number 8    Number of Visits 12    Date for PT Re-Evaluation 07/18/22    PT Start Time 1602    PT Stop Time 1653    PT Time Calculation (min) 51 min    Activity Tolerance Patient tolerated treatment well    Behavior During Therapy [facility] for tasks assessed/performed             Past Medical History:  Diagnosis Date   Arthritis    Asthma    Breast cancer (HCC) 04/24/2022   MVP (mitral valve prolapse)    Although no evidence per echo in 2009. Trace MR   Palpitation    Scoliosis    Urticaria    Vaginal Pap smear, abnormal    Past Surgical History:  Procedure Laterality Date   BREAST BIOPSY Left 04/24/2022   [place] LT BREAST BX W LOC DEV 1ST LESION IMG BX SPEC US GUIDE 04/24/2022 [facility]   BREAST BIOPSY Left 04/24/2022   [place] LT BREAST BX W LOC DEV EA ADD LESION IMG BX SPEC US GUIDE 04/24/2022 [facility]   BREAST BIOPSY Left 06/04/2022   [place] LT RADIOACTIVE SEED LOC 06/04/2022 [facility]   BREAST BIOPSY Left 06/04/2022   [place] LT RADIOACTIVE SEED EA ADD LESION 06/04/2022 [facility]   BREAST LUMPECTOMY WITH RADIOACTIVE SEED AND SENTINEL LYMPH NODE BIOPSY Left 06/06/2022   Procedure: LEFT BREAST LUMPECTOMY WITH RAADIOACTIVE SEED AND SENTINEL LYMPH NODE BIOPSY;  Surgeon: Griselda Miner, MD;  Location: [facility];  Service: General;  Laterality: Left;   KIDNEY STONE SURGERY Bilateral 2000   RADIOACTIVE SEED GUIDED AXILLARY SENTINEL LYMPH NODE Left 06/06/2022   Procedure: RADIOACTIVE SEED GUIDED LEFT AXILLARY SENTINEL LYMPH NODE DISSECTION;  Surgeon: Griselda Miner, MD;  Location: [facility];  Service: General;  Laterality: Left;   WISDOM TOOTH EXTRACTION Bilateral 1991   Patient Active Problem List   Diagnosis Date Noted    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 TOTH  REFERRING DIAG: C50.512 (ICD-10-CM) - Malignant neoplasm of lower-outer quadrant of left female breast Z17.0 (ICD-10-CM) - Estrogen receptor positive status (ER+)   THERAPY DIAG:  Stiffness of left shoulder, not elsewhere classified  Acute pain of left shoulder  Abnormal posture  Malignant neoplasm of lower-outer quadrant of left breast of female, estrogen receptor positive (HCC)  Rationale for Evaluation and Treatment: Rehabilitation  ONSET DATE: 04/24/22  SUBJECTIVE:  SUBJECTIVE STATEMENT: I didn't sleep well last night. The front of my shoulder has really been hurting and my elbow too  PERTINENT HISTORY:  Lt lumpectomy due to [facility] 06/06/22. 1/4+ nodes. Radiation planned.   PATIENT GOALS:  Reassess how my recovery is going related to arm function, pain, and swelling.  PAIN:  Are you having pain? Yes: NPRS scale: my arm is staying sore maybe because we are doing so much4/10 Pain location: back of the arm,  medial arm and elbow. It might be the cording Pain description: nerve pain Aggravating factors: sleeping  Relieving factors: medication    PRECAUTIONS:  Recent Surgery, left UE Lymphedema risk  ACTIVITY LEVEL / LEISURE: Not back to normal activities: I haven't gotten back to walking, and other exercises. Not back to work yet 06/30/22.     OBJECTIVE:   PATIENT SURVEYS:  QUICK DASH: 45% from 59%   OBSERVATIONS: Still wearing compression.  Breast incisions look well healed with glue still present.  Slight general edema to the breast.   POSTURE:  Guarded   LYMPHEDEMA ASSESSMENT:   UPPER EXTREMITY AROM/PROM:   A/PROM RIGHT   eval 166  Shoulder extension 66  Shoulder flexion 166  Shoulder abduction 176  Shoulder internal rotation 59  Shoulder external rotation 95                          (Blank rows = not tested)   A/PROM LEFT   eval 06/20/22 06/30/22  Shoulder extension 41 40 35  Shoulder flexion 118 135 - pull in breast and arm 155  Shoulder abduction 149 150 164  Shoulder internal rotation Unable due to P!    Shoulder external rotation Unable due to P! 25 pn                           (Blank rows = not tested)   UPPER EXTREMITY STRENGTH: did not assess due to pain   LYMPHEDEMA ASSESSMENTS:    LANDMARK RIGHT   eval  10 cm proximal to olecranon process 27.2  Olecranon process 24.1  10 cm proximal to ulnar styloid process 20.7  Just proximal to ulnar styloid process 15  Across hand at thumb web space 18.5  At base of 2nd digit 5.5  (Blank rows = not tested)   LANDMARK LEFT   eval  10 cm proximal to olecranon process 27  Olecranon process 25  10 cm proximal to ulnar styloid process 20.2  Just proximal to ulnar styloid process 15  Across hand at thumb web space 18.2  At base of 2nd digit 5.5  (Blank rows = not tested)  TODAY'S TREATMENT:  07/07/2022 Pulleys flexion and abduction x each  Supine wand flex and scaption x 3, emphasis on scapular depression Gr 3/4 post, ant  and inferior mobs Stargazer stretch x 3 TFM to left prox biceps tendon MFR to Left UE cording at approx 120 deg abd with pillow under  arm. Started very gently due to pt discomfort. STM to left pectorals, lateral trunk and UT with cocoa butter PROM to left shoulder flex, scaption, abd, IR and ER SL sleeper stretch for IR and ER x 2-3 ea Educated pt in gentle ice for anterior shoulder/ TG soft for cording to return to wearing   07/02/2022 Pulleys flexion and abduction x each  Wall ball flexion and abduction x 5 each Supine Dowel flexion x 6 Bil  alternation horizontal abduction x 6 aiming to hit the pillow as the table was too far  Alternating overhead flexion x 6  Pro/ret x 6  Active abd into behind the back x 10 GH mobs post and inf gr 3/4 at various angles of ER and flexion Long arm distraction PROM left shoulder flexion, scaption, abd, IR, ER Sidelying active ER with manual assist Cording release in the axilila and upper arm Re-did SOZO early due to cording and sensations at the upper arm and it had no change.   06/30/2022 Pulleys flexion and abduction Wall ball flexion and abduction x 5 each Extension dowel x 6  Dowel up the back x 6  GH mobs post and inf gr 3/4 at various angles of ER and flexion Long arm distraction PROM left shoulder flexion, scaption, abd, IR, ER Sidelying active ER with manual assist Cording release in the axilila and upper arm  06/26/22: Manual Therapy P/ROM in supine to Lt shoulder into flex, abd and D2 to pts tolerance and with scapular depression applied by therapist throughout; multiple VC's for pt to relax due to muscle guarding MFR to Lt axilla and medial upper arm  STM to Lt medial scapular border and UT in Rt S/L with cocoa butter Scap Mobs in Rt S/L to Lt scapula into protraction and retraction  06/24/2022 Clasped hands flexion and stargazer x 5 ea GH mobs post and inf gr 3/4 Supine wand flex and scaption x 4 Long arm distraction PROM left shoulder flexion, scaption, abd, IR, ER with C-R stretch Left SL sleeper stretch for IR and ER Practiced abd wall slides and  showed NTS with wrist oscillation Updated HEP with sleeper stretch and wall stretch for chest/NTS   06/20/22: Post op re-eval PROM into flexion, abduction, ER GH AP mob   PATIENT EDUCATION:  Education details: post op instruction and POC for frozen shoulder Person educated: Patient Education method: Explanation, Demonstration, and Handouts Education comprehension: verbalized understanding  HOME EXERCISE PROGRAM: Reviewed previously given post op HEP which includes frozen shoulder stretches of: supine flexion, chest stretch, sleeper stretch, and wall walking  ASSESSMENT:  CLINICAL IMPRESSION:  Cording was very sensitive and tight today. Complaints of increased anterior shoulder pain with tenderness at prox biceps. Reviewed scapular depression and pain was improved when her shoulder did not ride up.  Discussed return to TG soft for cording and gentle ice 5-7 min for anterior shoulder pain. Advised pt to use arm where she can for home activities as she has been avoiding it.  Pt will benefit from skilled therapeutic intervention to improve on the following deficits: Decreased knowledge of precautions, impaired UE functional use, pain, decreased ROM, postural dysfunction.   PT treatment/interventions: ADL/Self care home management, Therapeutic exercises, Patient/Family education, Self Care, Joint mobilization, DME instructions, Manual therapy, and Re-evaluation   GOALS: Goals reviewed with patient? Yes  LONG TERM GOALS:  (STG=LTG)  GOALS Name Target Date  Goal status  1 Pt will demonstrate she has regained full shoulder ROM and function post operatively compared to baselines.  Baseline: 06/20/22 - met but will aim for improvements for radiation INITIAL  2 Pt will improve Left shoulder ROM to obtain radiation position with comfort  NEW  3 Pt will be ind with final HEP for shoulder mobility   NEW          PLAN:  PT FREQUENCY/DURATION: 1-2x per week for 4 weeks  PLAN FOR NEXT  SESSION: resume Lt frozen shoulder visits to prepare for  radiation. Healed well from lumpectomy, MFR to axillary/upper arm cording, mobs, PROM   [facility]  [address]  [place] [address]  [phone]    Waynette Buttery, PT 07/07/2022, 5:00 PM

## 2022-07-08 DIAGNOSIS — Z8042 Family history of malignant neoplasm of prostate: Secondary | ICD-10-CM | POA: Diagnosis not present

## 2022-07-08 DIAGNOSIS — Z803 Family history of malignant neoplasm of breast: Secondary | ICD-10-CM | POA: Diagnosis not present

## 2022-07-08 DIAGNOSIS — Z170 Estrogen receptor positive status [ER+]: Secondary | ICD-10-CM | POA: Diagnosis not present

## 2022-07-08 DIAGNOSIS — N6001 Solitary cyst of right breast: Secondary | ICD-10-CM | POA: Diagnosis not present

## 2022-07-08 DIAGNOSIS — I341 Nonrheumatic mitral (valve) prolapse: Secondary | ICD-10-CM | POA: Diagnosis not present

## 2022-07-08 DIAGNOSIS — T66XXXA Radiation sickness, unspecified, initial encounter: Secondary | ICD-10-CM | POA: Diagnosis not present

## 2022-07-08 DIAGNOSIS — C773 Secondary and unspecified malignant neoplasm of axilla and upper limb lymph nodes: Secondary | ICD-10-CM | POA: Diagnosis not present

## 2022-07-08 DIAGNOSIS — C50512 Malignant neoplasm of lower-outer quadrant of left female breast: Secondary | ICD-10-CM | POA: Diagnosis not present

## 2022-07-08 DIAGNOSIS — N6002 Solitary cyst of left breast: Secondary | ICD-10-CM | POA: Diagnosis not present

## 2022-07-08 DIAGNOSIS — Z510 Encounter for antineoplastic radiation therapy: Secondary | ICD-10-CM | POA: Diagnosis not present

## 2022-07-09 ENCOUNTER — Ambulatory Visit: Payer: BC Managed Care – PPO | Admitting: Physical Therapy

## 2022-07-09 ENCOUNTER — Encounter: Payer: Self-pay | Admitting: Physical Therapy

## 2022-07-09 ENCOUNTER — Ambulatory Visit (INDEPENDENT_AMBULATORY_CARE_PROVIDER_SITE_OTHER): Payer: BC Managed Care – PPO

## 2022-07-09 ENCOUNTER — Inpatient Hospital Stay: Payer: BC Managed Care – PPO | Attending: Hematology and Oncology | Admitting: Hematology and Oncology

## 2022-07-09 ENCOUNTER — Other Ambulatory Visit: Payer: Self-pay

## 2022-07-09 VITALS — BP 124/86 | HR 83 | Temp 97.7°F | Resp 18 | Ht 67.0 in | Wt 163.2 lb

## 2022-07-09 DIAGNOSIS — M25512 Pain in left shoulder: Secondary | ICD-10-CM | POA: Diagnosis not present

## 2022-07-09 DIAGNOSIS — R293 Abnormal posture: Secondary | ICD-10-CM | POA: Diagnosis not present

## 2022-07-09 DIAGNOSIS — Z79899 Other long term (current) drug therapy: Secondary | ICD-10-CM | POA: Diagnosis not present

## 2022-07-09 DIAGNOSIS — Z170 Estrogen receptor positive status [ER+]: Secondary | ICD-10-CM | POA: Diagnosis not present

## 2022-07-09 DIAGNOSIS — C50512 Malignant neoplasm of lower-outer quadrant of left female breast: Secondary | ICD-10-CM | POA: Diagnosis not present

## 2022-07-09 DIAGNOSIS — M25612 Stiffness of left shoulder, not elsewhere classified: Secondary | ICD-10-CM | POA: Diagnosis not present

## 2022-07-09 DIAGNOSIS — J309 Allergic rhinitis, unspecified: Secondary | ICD-10-CM | POA: Diagnosis not present

## 2022-07-09 DIAGNOSIS — C773 Secondary and unspecified malignant neoplasm of axilla and upper limb lymph nodes: Secondary | ICD-10-CM | POA: Diagnosis not present

## 2022-07-09 NOTE — Assessment & Plan Note (Addendum)
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

## 2022-07-09 NOTE — Therapy (Signed)
OUTPATIENT PHYSICAL THERAPY BREAST CANCER TREATMENT   Patient Name: Mariah Burnett MRN: [id number] DOB:January 24, 1975, 47 y.o., female Today's Date: 07/09/2022  END OF SESSION:  PT End of Session - 07/09/22 0956     Visit Number 9    Number of Visits 12    Date for PT Re-Evaluation 07/18/22    PT Start Time 0906    PT Stop Time 0954    PT Time Calculation (min) 48 min    Activity Tolerance Patient tolerated treatment well    Behavior During Therapy [facility] for tasks assessed/performed              Past Medical History:  Diagnosis Date   Arthritis    Asthma    Breast cancer (HCC) 04/24/2022   MVP (mitral valve prolapse)    Although no evidence per echo in 2009. Trace MR   Palpitation    Scoliosis    Urticaria    Vaginal Pap smear, abnormal    Past Surgical History:  Procedure Laterality Date   BREAST BIOPSY Left 04/24/2022   [place] LT BREAST BX W LOC DEV 1ST LESION IMG BX SPEC US GUIDE 04/24/2022 [facility]   BREAST BIOPSY Left 04/24/2022   [place] LT BREAST BX W LOC DEV EA ADD LESION IMG BX SPEC US GUIDE 04/24/2022 [facility]   BREAST BIOPSY Left 06/04/2022   [place] LT RADIOACTIVE SEED LOC 06/04/2022 [facility]   BREAST BIOPSY Left 06/04/2022   [place] LT RADIOACTIVE SEED EA ADD LESION 06/04/2022 [facility]   BREAST LUMPECTOMY WITH RADIOACTIVE SEED AND SENTINEL LYMPH NODE BIOPSY Left 06/06/2022   Procedure: LEFT BREAST LUMPECTOMY WITH RAADIOACTIVE SEED AND SENTINEL LYMPH NODE BIOPSY;  Surgeon: Griselda Miner, MD;  Location: [facility];  Service: General;  Laterality: Left;   KIDNEY STONE SURGERY Bilateral 2000   RADIOACTIVE SEED GUIDED AXILLARY SENTINEL LYMPH NODE Left 06/06/2022   Procedure: RADIOACTIVE SEED GUIDED LEFT AXILLARY SENTINEL LYMPH NODE DISSECTION;  Surgeon: Griselda Miner, MD;  Location: [facility];  Service: General;  Laterality: Left;   WISDOM TOOTH EXTRACTION Bilateral 1991   Patient Active Problem List   Diagnosis Date Noted    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 TOTH  REFERRING DIAG: C50.512 (ICD-10-CM) - Malignant neoplasm of lower-outer quadrant of left female breast Z17.0 (ICD-10-CM) - Estrogen receptor positive status (ER+)   THERAPY DIAG:  Stiffness of left shoulder, not elsewhere classified  Acute pain of left shoulder  Abnormal posture  Malignant neoplasm of lower-outer quadrant of left breast of female, estrogen receptor positive (HCC)  Rationale for Evaluation and Treatment: Rehabilitation  ONSET DATE: 04/24/22  SUBJECTIVE:  SUBJECTIVE STATEMENT: I felt good after [name] worked on me last time.   PERTINENT HISTORY:  Lt lumpectomy due to [facility] 06/06/22. 1/4+ nodes. Radiation planned.   PATIENT GOALS:  Reassess how my recovery is going related to arm function, pain, and swelling.  PAIN:  Are you having pain? Yes: NPRS scale: 2/10 Pain location: back of the arm,  medial arm and elbow. It might be the cording, also shoulder Pain description: nerve pain Aggravating factors: sleeping  Relieving factors: medication    PRECAUTIONS: Recent Surgery, left UE Lymphedema risk  ACTIVITY LEVEL / LEISURE: Not back to normal  activities: I haven't gotten back to walking, and other exercises. Not back to work yet 06/30/22.     OBJECTIVE:   PATIENT SURVEYS:  QUICK DASH: 45% from 59%   OBSERVATIONS: Still wearing compression.  Breast incisions look well healed with glue still present.  Slight general edema to the breast.   POSTURE:  Guarded   LYMPHEDEMA ASSESSMENT:   UPPER EXTREMITY AROM/PROM:   A/PROM RIGHT   eval 166  Shoulder extension 66  Shoulder flexion 166  Shoulder abduction 176  Shoulder internal rotation 59  Shoulder external rotation 95                          (Blank rows = not tested)   A/PROM LEFT   eval 06/20/22 06/30/22  Shoulder extension 41 40 35  Shoulder flexion 118 135 - pull in breast and arm 155  Shoulder abduction 149 150 164  Shoulder internal rotation Unable due to P!    Shoulder external rotation Unable due to P! 25 pn                           (Blank rows = not tested)   UPPER EXTREMITY STRENGTH: did not assess due to pain   LYMPHEDEMA ASSESSMENTS:    LANDMARK RIGHT   eval  10 cm proximal to olecranon process 27.2  Olecranon process 24.1  10 cm proximal to ulnar styloid process 20.7  Just proximal to ulnar styloid process 15  Across hand at thumb web space 18.5  At base of 2nd digit 5.5  (Blank rows = not tested)   LANDMARK LEFT   eval  10 cm proximal to olecranon process 27  Olecranon process 25  10 cm proximal to ulnar styloid process 20.2  Just proximal to ulnar styloid process 15  Across hand at thumb web space 18.2  At base of 2nd digit 5.5  (Blank rows = not tested)  TODAY'S TREATMENT:  07/09/2022 Pulleys flexion and abduction x each  Ball up wall x 10 reps in direction of flexion and 10 reps in direction of L shoulder abduction with no pain MFR to Left UE cording at approx 120 deg abd with numerous cords palpable but becoming more difficult to palpate by end of session STM in R side lying to L scapular muscles and L lats and upper traps to  decrease tightness with increased tightness noted at L lats  07/07/2022 Pulleys flexion and abduction x each  Supine wand flex and scaption x 3, emphasis on scapular depression Gr 3/4 post, ant  and inferior mobs Stargazer stretch x 3 TFM to left prox biceps tendon MFR to Left UE cording at approx 120 deg abd with pillow under arm. Started very gently due to pt discomfort. STM to left pectorals, lateral trunk and UT  with cocoa butter PROM to left shoulder flex, scaption, abd, IR and ER SL sleeper stretch for IR and ER x 2-3 ea Educated pt in gentle ice for anterior shoulder/ TG soft for cording to return to wearing   07/02/2022 Pulleys flexion and abduction x each  Wall ball flexion and abduction x 5 each Supine Dowel flexion x 6 Bil alternation horizontal abduction x 6 aiming to hit the pillow as the table was too far  Alternating overhead flexion x 6  Pro/ret x 6  Active abd into behind the back x 10 GH mobs post and inf gr 3/4 at various angles of ER and flexion Long arm distraction PROM left shoulder flexion, scaption, abd, IR, ER Sidelying active ER with manual assist Cording release in the axilila and upper arm Re-did SOZO early due to cording and sensations at the upper arm and it had no change.   06/30/2022 Pulleys flexion and abduction Wall ball flexion and abduction x 5 each Extension dowel x 6  Dowel up the back x 6  GH mobs post and inf gr 3/4 at various angles of ER and flexion Long arm distraction PROM left shoulder flexion, scaption, abd, IR, ER Sidelying active ER with manual assist Cording release in the axilila and upper arm  06/26/22: Manual Therapy P/ROM in supine to Lt shoulder into flex, abd and D2 to pts tolerance and with scapular depression applied by therapist throughout; multiple VC's for pt to relax due to muscle guarding MFR to Lt axilla and medial upper arm  STM to Lt medial scapular border and UT in Rt S/L with cocoa butter Scap Mobs  in Rt S/L to Lt scapula into protraction and retraction  06/24/2022 Clasped hands flexion and stargazer x 5 ea GH mobs post and inf gr 3/4 Supine wand flex and scaption x 4 Long arm distraction PROM left shoulder flexion, scaption, abd, IR, ER with C-R stretch Left SL sleeper stretch for IR and ER Practiced abd wall slides and showed NTS with wrist oscillation Updated HEP with sleeper stretch and wall stretch for chest/NTS   06/20/22: Post op re-eval PROM into flexion, abduction, ER GH AP mob   PATIENT EDUCATION:  Education details: tennis ball on wall for self massage Person educated: Patient Education method: Medical illustrator Education comprehension: verbalized understanding  HOME EXERCISE PROGRAM: Reviewed previously given post op HEP which includes frozen shoulder stretches of: supine flexion, chest stretch, sleeper stretch, and wall walking Tennis ball on wall for self massage  ASSESSMENT:  CLINICAL IMPRESSION:  Pt reports that her shoulder is feeling better since last session. She reports the manual therapy to the cording and STM really helped with decreasing her pain. Continued with AAROM exercises and continued with MFR to cording and soft tissue mobilization to decrease tightness. Educated pt in using tennis ball in TG soft and lean against wall for trigger point release.   Pt will benefit from skilled therapeutic intervention to improve on the following deficits: Decreased knowledge of precautions, impaired UE functional use, pain, decreased ROM, postural dysfunction.   PT treatment/interventions: ADL/Self care home management, Therapeutic exercises, Patient/Family education, Self Care, Joint mobilization, DME instructions, Manual therapy, and Re-evaluation   GOALS: Goals reviewed with patient? Yes  LONG TERM GOALS:  (STG=LTG)  GOALS Name Target Date  Goal status  1 Pt will demonstrate she has regained full shoulder ROM and function post operatively  compared to baselines.  Baseline: 06/20/22 - met but will aim for improvements for  radiation INITIAL  2 Pt will improve Left shoulder ROM to obtain radiation position with comfort  NEW  3 Pt will be ind with final HEP for shoulder mobility   NEW          PLAN:  PT FREQUENCY/DURATION: 1-2x per week for 4 weeks  PLAN FOR NEXT SESSION: how is tennis ball working? resume Lt frozen shoulder visits to prepare for radiation. Healed well from lumpectomy, MFR to axillary/upper arm cording, mobs, PROM   [facility]  [address]  [place] [address]  [phone]    Milagros Loll Kechi, PT 07/09/2022, 10:04 AM

## 2022-07-11 DIAGNOSIS — Z8042 Family history of malignant neoplasm of prostate: Secondary | ICD-10-CM | POA: Diagnosis not present

## 2022-07-11 DIAGNOSIS — Z170 Estrogen receptor positive status [ER+]: Secondary | ICD-10-CM | POA: Diagnosis not present

## 2022-07-11 DIAGNOSIS — Z803 Family history of malignant neoplasm of breast: Secondary | ICD-10-CM | POA: Diagnosis not present

## 2022-07-11 DIAGNOSIS — C50512 Malignant neoplasm of lower-outer quadrant of left female breast: Secondary | ICD-10-CM | POA: Diagnosis not present

## 2022-07-11 DIAGNOSIS — N6001 Solitary cyst of right breast: Secondary | ICD-10-CM | POA: Diagnosis not present

## 2022-07-11 DIAGNOSIS — C773 Secondary and unspecified malignant neoplasm of axilla and upper limb lymph nodes: Secondary | ICD-10-CM | POA: Diagnosis not present

## 2022-07-11 DIAGNOSIS — Z510 Encounter for antineoplastic radiation therapy: Secondary | ICD-10-CM | POA: Diagnosis not present

## 2022-07-11 DIAGNOSIS — I341 Nonrheumatic mitral (valve) prolapse: Secondary | ICD-10-CM | POA: Diagnosis not present

## 2022-07-11 DIAGNOSIS — N6002 Solitary cyst of left breast: Secondary | ICD-10-CM | POA: Diagnosis not present

## 2022-07-14 ENCOUNTER — Encounter: Payer: Self-pay | Admitting: Rehabilitation

## 2022-07-14 ENCOUNTER — Telehealth: Payer: Self-pay | Admitting: *Deleted

## 2022-07-14 ENCOUNTER — Ambulatory Visit: Payer: BC Managed Care – PPO | Admitting: Rehabilitation

## 2022-07-14 DIAGNOSIS — M25512 Pain in left shoulder: Secondary | ICD-10-CM | POA: Diagnosis not present

## 2022-07-14 DIAGNOSIS — C50512 Malignant neoplasm of lower-outer quadrant of left female breast: Secondary | ICD-10-CM | POA: Diagnosis not present

## 2022-07-14 DIAGNOSIS — Z170 Estrogen receptor positive status [ER+]: Secondary | ICD-10-CM

## 2022-07-14 DIAGNOSIS — M25612 Stiffness of left shoulder, not elsewhere classified: Secondary | ICD-10-CM | POA: Diagnosis not present

## 2022-07-14 DIAGNOSIS — R293 Abnormal posture: Secondary | ICD-10-CM

## 2022-07-14 NOTE — Addendum Note (Signed)
Addended by: Idamae Lusher on: 07/14/2022 05:10 PM   Modules accepted: Orders

## 2022-07-14 NOTE — Telephone Encounter (Signed)
DCP-001: Use of a Clinical Trial Screening Tool to Address Cancer Health Disparities in the [employer] Research Program [facility])   Left message with patient informing her of the above study and this research nurse will plan to meet with her briefly after her Genetic Counseling appointment tomorrow. Informed patient that this study is voluntary.  Domenica Reamer, BSN, RN, [employer] Research Nurse II [phone] 07/14/2022 2:54 PM

## 2022-07-14 NOTE — Therapy (Addendum)
OUTPATIENT PHYSICAL THERAPY BREAST CANCER TREATMENT   Patient Name: Mariah Burnett MRN: [id number] DOB:Mar 07, 1975, 47 y.o., female Today's Date: 07/14/2022  END OF SESSION:  PT End of Session - 07/14/22 1659     Visit Number 10    Number of Visits 19    Date for PT Re-Evaluation 08/18/22    PT Start Time 1600    PT Stop Time 1650    PT Time Calculation (min) 50 min    Activity Tolerance Patient tolerated treatment well    Behavior During Therapy [facility] for tasks assessed/performed               Past Medical History:  Diagnosis Date   Arthritis    Asthma    Breast cancer (HCC) 04/24/2022   MVP (mitral valve prolapse)    Although no evidence per echo in 2009. Trace MR   Palpitation    Scoliosis    Urticaria    Vaginal Pap smear, abnormal    Past Surgical History:  Procedure Laterality Date   BREAST BIOPSY Left 04/24/2022   [place] LT BREAST BX W LOC DEV 1ST LESION IMG BX SPEC US GUIDE 04/24/2022 [facility]-ULTRASOUND   BREAST BIOPSY Left 04/24/2022   [place] LT BREAST BX W LOC DEV EA ADD LESION IMG BX SPEC US GUIDE 04/24/2022 AP-ULTRASOUND   BREAST BIOPSY Left 06/04/2022   [place] LT RADIOACTIVE SEED LOC 06/04/2022 [facility] MAMMOGRAPHY   BREAST BIOPSY Left 06/04/2022   [place] LT RADIOACTIVE SEED EA ADD LESION 06/04/2022 [facility] MAMMOGRAPHY   BREAST LUMPECTOMY WITH RADIOACTIVE SEED AND SENTINEL LYMPH NODE BIOPSY Left 06/06/2022   Procedure: LEFT BREAST LUMPECTOMY WITH RAADIOACTIVE SEED AND SENTINEL LYMPH NODE BIOPSY;  Surgeon: Griselda Miner, MD;  Location: [facility];  Service: General;  Laterality: Left;   KIDNEY STONE SURGERY Bilateral 2000   RADIOACTIVE SEED GUIDED AXILLARY SENTINEL LYMPH NODE Left 06/06/2022   Procedure: RADIOACTIVE SEED GUIDED LEFT AXILLARY SENTINEL LYMPH NODE DISSECTION;  Surgeon: Griselda Miner, MD;  Location: [facility];  Service: General;  Laterality: Left;   WISDOM TOOTH EXTRACTION Bilateral 1991   Patient Active Problem List   Diagnosis Date Noted    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 TOTH  REFERRING DIAG: C50.512 (ICD-10-CM) - Malignant neoplasm of lower-outer quadrant of left female breast Z17.0 (ICD-10-CM) - Estrogen receptor positive status (ER+)   THERAPY DIAG:  Stiffness of left shoulder, not elsewhere classified  Acute pain of left shoulder  Abnormal posture  Malignant neoplasm of lower-outer quadrant of left breast of female, estrogen receptor positive (HCC)  Rationale for Evaluation and Treatment: Rehabilitation  ONSET DATE: 04/24/22  SUBJECTIVE:  SUBJECTIVE STATEMENT: I went hiking and wore my sleeve and my ankles were swollen, but it went back down.  They did my mapping for radiation.  I start next week.   PERTINENT HISTORY:  Lt lumpectomy due to [facility] 06/06/22. 1/4+ nodes. Radiation planned.   PATIENT GOALS:  Reassess how my recovery is going related to arm function, pain, and swelling.  PAIN:  Are you having pain? Yes: NPRS scale: 2/10 Pain location: back of the arm,  medial arm and elbow. It might be the cording, also shoulder Pain description: nerve pain Aggravating factors: sleeping  Relieving factors: medication    PRECAUTIONS:  Recent Surgery, left UE Lymphedema risk  ACTIVITY LEVEL / LEISURE: Not back to normal activities: I haven't gotten back to walking, and other exercises. Not back to work yet 06/30/22.     OBJECTIVE:   PATIENT SURVEYS:  QUICK DASH: 45% from 59%   OBSERVATIONS: Still wearing compression.  Breast incisions look well healed with glue still present.  Slight general edema to the breast.   POSTURE:  Guarded   LYMPHEDEMA ASSESSMENT:   UPPER EXTREMITY AROM/PROM:   A/PROM RIGHT   eval 166  Shoulder extension 66  Shoulder flexion 166  Shoulder abduction 176  Shoulder internal rotation 59  Shoulder external rotation 95                          (Blank rows = not tested)   A/PROM LEFT   eval 06/20/22 06/30/22  Shoulder extension 41 40 35  Shoulder flexion 118 135 - pull in breast and arm 155  Shoulder abduction 149 150 164  Shoulder internal rotation Unable due to P!    Shoulder external rotation Unable due to P! 25 pn                           (Blank rows = not tested)   UPPER EXTREMITY STRENGTH: did not assess due to pain   LYMPHEDEMA ASSESSMENTS:    LANDMARK RIGHT   eval  10 cm proximal to olecranon process 27.2  Olecranon process 24.1  10 cm proximal to ulnar styloid process 20.7  Just proximal to ulnar styloid process 15  Across hand at thumb web space 18.5  At base of 2nd digit 5.5  (Blank rows = not tested)   LANDMARK LEFT   eval  10 cm proximal to olecranon process 27  Olecranon process 25  10 cm proximal to ulnar styloid process 20.2  Just proximal to ulnar styloid process 15  Across hand at thumb web space 18.2  At base of 2nd digit 5.5  (Blank rows = not tested)  TODAY'S TREATMENT: 07/14/2022 Pulleys flexion and abduction x each  Ball up wall x 10 reps in direction of flexion and 10 reps in direction of L shoulder abduction Red band row x 10 Red band ext x 10 bil Pt having AC joint ttp and pain with horizontal adduction in the same spot so started [facility]  joint inferior and AP mobilizations grade Iv4x20" each  GH AP and inferior mob STM in Rt pectoralis, anterior shoulder, deltoid, supraspinatus insertion  07/09/2022 Pulleys flexion and abduction x each  Ball up wall x 10 reps in direction of flexion and 10 reps in direction of L shoulder abduction with no pain MFR to Left UE cording at approx 120 deg abd with numerous cords palpable but becoming more difficult to  palpate by end of session STM in R side lying to L scapular muscles and L lats and upper traps to decrease tightness with increased tightness noted at L lats  07/07/2022 Pulleys flexion and abduction x each  Supine wand flex and scaption x 3, emphasis on scapular depression Gr 3/4 post, ant  and inferior mobs Stargazer stretch x 3 TFM to left prox biceps tendon MFR to Left UE cording at approx 120 deg abd with pillow under arm. Started very gently due to pt discomfort. STM to left pectorals, lateral trunk and UT with cocoa butter PROM to left shoulder flex, scaption, abd, IR and ER SL sleeper stretch for IR and ER x 2-3 ea Educated pt in gentle ice for anterior shoulder/ TG soft for cording to return to wearing   PATIENT EDUCATION:  Education details: tennis ball on wall for self massage Person educated: Patient Education method: Medical illustrator Education comprehension: verbalized understanding  HOME EXERCISE PROGRAM: Reviewed previously given post op HEP which includes frozen shoulder stretches of: supine flexion, chest stretch, sleeper stretch, and wall walking Tennis ball on wall for self massage  ASSESSMENT:  CLINICAL IMPRESSION:  Pt is having more AC joint / supraspinatus insertion pain today so more AC joint mob were included today and education on avoiding her stretches that pinch here.   Extended POC to continue through radiation.   Pt will benefit from skilled therapeutic intervention to improve on the following deficits: Decreased knowledge  of precautions, impaired UE functional use, pain, decreased ROM, postural dysfunction.   PT treatment/interventions: ADL/Self care home management, Therapeutic exercises, Patient/Family education, Self Care, Joint mobilization, DME instructions, Manual therapy, and Re-evaluation   GOALS: Goals reviewed with patient? Yes  LONG TERM GOALS:  (STG=LTG)  GOALS Name Target Date  Goal status  1 Pt will demonstrate she has regained full shoulder ROM and function post operatively compared to baselines.  Baseline: 06/20/22 - met but will aim for improvements for radiation INITIAL  2 Pt will improve Left shoulder ROM to obtain radiation position with comfort  MET  3 Pt will be ind with final HEP for shoulder mobility   ONGOING          PLAN:  PT FREQUENCY/DURATION: 1-2x per week for 4 weeks  PLAN FOR NEXT SESSION: how is tennis ball working? resume Lt frozen shoulder visits to prepare for radiation. Healed well from lumpectomy, MFR to axillary/upper arm cording, mobs, PROM   [facility]  [address], [address]  [place] [address]  [phone]    Idamae Lusher, PT 07/14/2022, 5:00 PM

## 2022-07-15 ENCOUNTER — Other Ambulatory Visit: Payer: Self-pay

## 2022-07-15 ENCOUNTER — Ambulatory Visit: Payer: BC Managed Care – PPO | Attending: Internal Medicine

## 2022-07-15 ENCOUNTER — Other Ambulatory Visit: Payer: Self-pay | Admitting: Genetic Counselor

## 2022-07-15 ENCOUNTER — Encounter: Payer: Self-pay | Admitting: *Deleted

## 2022-07-15 ENCOUNTER — Inpatient Hospital Stay: Payer: BC Managed Care – PPO

## 2022-07-15 ENCOUNTER — Encounter: Payer: Self-pay | Admitting: Genetic Counselor

## 2022-07-15 ENCOUNTER — Inpatient Hospital Stay: Payer: BC Managed Care – PPO | Admitting: Genetic Counselor

## 2022-07-15 DIAGNOSIS — Z803 Family history of malignant neoplasm of breast: Secondary | ICD-10-CM | POA: Diagnosis not present

## 2022-07-15 DIAGNOSIS — C50512 Malignant neoplasm of lower-outer quadrant of left female breast: Secondary | ICD-10-CM | POA: Diagnosis not present

## 2022-07-15 DIAGNOSIS — Z170 Estrogen receptor positive status [ER+]: Secondary | ICD-10-CM | POA: Diagnosis not present

## 2022-07-15 DIAGNOSIS — Z1379 Encounter for other screening for genetic and chromosomal anomalies: Secondary | ICD-10-CM

## 2022-07-15 DIAGNOSIS — I341 Nonrheumatic mitral (valve) prolapse: Secondary | ICD-10-CM

## 2022-07-15 DIAGNOSIS — Z8051 Family history of malignant neoplasm of kidney: Secondary | ICD-10-CM | POA: Diagnosis not present

## 2022-07-15 DIAGNOSIS — Z853 Personal history of malignant neoplasm of breast: Secondary | ICD-10-CM | POA: Diagnosis not present

## 2022-07-15 LAB — GENETIC SCREENING ORDER

## 2022-07-15 NOTE — Research (Signed)
Trial:  DCP-001: Use of a Clinical Trial Screening Tool to Address Cancer Health Disparities in the [employer] Community Oncology Research Program [facility])   Patient Mariah Burnett was identified by this research nurse as a potential candidate for the above listed study.  This Clinical Research Nurse met with Josceline Chenard, [id number], on 07/15/22 in a manner and location that ensures patient privacy to discuss participation in the above listed research study.  Patient is Unaccompanied.  A copy of the informed consent document and separate HIPAA Authorization was provided to the patient.  Patient reads, speaks, and understands [place].   Patient was provided with the business card of this Nurse and encouraged to contact the research team with any questions.  Approximately 15 minutes were spent with the patient reviewing the informed consent documents.  Patient was provided the option of taking informed consent documents home to review and was encouraged to review at their convenience with their support network, including other care providers. Patient took the consent documents home to review. Informed patient that study can be completed by phone since patient will likely not have any further in clinic visits before the deadline to enroll on this study which is 08/05/22.  Research nurse will call patient at the end of the month to follow up unless patient calls sooner. Patient agreed to this plan. Thanked patient for her time today. Domenica Reamer, BSN, RN, [employer] Clinical Research Nurse II [phone] 07/15/2022 10:20 AM

## 2022-07-15 NOTE — Telephone Encounter (Signed)
Pt scheduled for echo today at 11:30 am in the [place] office.

## 2022-07-15 NOTE — Progress Notes (Signed)
REFERRING PROVIDER: Serena Croissant, MD  PRIMARY PROVIDER:  Billie Lade, MD  PRIMARY REASON FOR VISIT:  1. Malignant neoplasm of lower-outer quadrant of left breast of female, estrogen receptor positive (HCC)   2. Family history of breast cancer    HISTORY OF PRESENT ILLNESS:   Mariah Burnett, a 47 y.o. female, was seen for a [place] cancer genetics consultation at the request of Dr. Pamelia Hoit due to a personal and family history of cancer.  Mariah Burnett presents to clinic today to discuss the possibility of a hereditary predisposition to cancer, to discuss genetic testing, and to further clarify her future cancer risks, as well as potential cancer risks for family members.   In May 2024, at the age of 65, Mariah Burnett was diagnosed with invasive ductal carcinoma of the left breast (ER/PR positive, HER2 negative). She had a lumpectomy in May. She had negative hereditary cancer genetic testing through [facility] Panel) in 2014.  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)  04/24/2022 Initial Diagnosis   Left breast mass at 4 o'clock position 1.8 cm, 1 abnormal lymph node (positive), MRI revealed 2.3 cm mass: Biopsy: Grade 2 IDC with papillary features ER 80%, PR 100%, Ki-67 10%, HER2 0 negative   05/12/2022 Cancer Staging   Staging form: Breast, AJCC 8th Edition - Clinical stage from 05/12/2022: Stage IIA (cT2, cN1(f), cM0, G2, ER+, PR+, HER2-) - Signed by Ronny Bacon, PA-C on 05/14/2022 Stage prefix: Initial diagnosis Method of lymph node assessment: Core biopsy Histologic grading system: 3 grade system   06/06/2022 Surgery   Left breast lumpectomy: IDC, 1.6 cm, grade 2, margins negative, 1/4 LN + carcinoma.    06/06/2022 Oncotype testing   3/2%   06/06/2022 Cancer Staging   Staging form: Breast, AJCC 8th Edition - Pathologic stage from 06/06/2022: Stage IA (pT1c, pN1a, cM0, G2, ER+, PR+, HER2-, Oncotype DX score: 3) - Signed  by Loa Socks, NP on 06/19/2022 Stage prefix: Initial diagnosis Multigene prognostic tests performed: Oncotype DX Recurrence score range: Less than 11 Histologic grading system: 3 grade system     RISK FACTORS:  First live birth at age n/a.  OCP use for approximately 10-15 years.  Ovaries intact: yes.  Uterus intact: yes.  Menopausal status: premenopausal.  HRT use: 0 years. Colonoscopy: no Mammogram within the last year: yes. Any excessive radiation exposure in the past: no  Past Medical History:  Diagnosis Date   Arthritis    Asthma    Breast cancer (HCC) 04/24/2022   MVP (mitral valve prolapse)    Although no evidence per echo in 2009. Trace MR   Palpitation    Scoliosis    Urticaria    Vaginal Pap smear, abnormal     Past Surgical History:  Procedure Laterality Date   BREAST BIOPSY Left 04/24/2022   [place] LT BREAST BX W LOC DEV 1ST LESION IMG BX SPEC US GUIDE 04/24/2022 AP-ULTRASOUND   BREAST BIOPSY Left 04/24/2022   [place] LT BREAST BX W LOC DEV EA ADD LESION IMG BX SPEC US GUIDE 04/24/2022 AP-ULTRASOUND   BREAST BIOPSY Left 06/04/2022   [place] LT RADIOACTIVE SEED LOC 06/04/2022 [facility] MAMMOGRAPHY   BREAST BIOPSY Left 06/04/2022   [place] LT RADIOACTIVE SEED EA ADD LESION 06/04/2022 [facility] MAMMOGRAPHY   BREAST LUMPECTOMY WITH RADIOACTIVE SEED AND SENTINEL LYMPH NODE BIOPSY Left 06/06/2022   Procedure: LEFT BREAST LUMPECTOMY WITH RAADIOACTIVE SEED AND SENTINEL LYMPH NODE BIOPSY;  Surgeon:  Griselda Miner, MD;  Location: [facility];  Service: General;  Laterality: Left;   KIDNEY STONE SURGERY Bilateral 2000   RADIOACTIVE SEED GUIDED AXILLARY SENTINEL LYMPH NODE Left 06/06/2022   Procedure: RADIOACTIVE SEED GUIDED LEFT AXILLARY SENTINEL LYMPH NODE DISSECTION;  Surgeon: Griselda Miner, MD;  Location: [facility];  Service: General;  Laterality: Left;   WISDOM TOOTH EXTRACTION Bilateral 1991    Social History   Socioeconomic History   Marital  status: Divorced    Spouse name: Not on file   Number of children: Not on file   Years of education: Not on file   Highest education level: Not on file  Occupational History   Not on file  Tobacco Use   Smoking status: Never   Smokeless tobacco: Never  Vaping Use   Vaping Use: Never used  Substance and Sexual Activity   Alcohol use: Yes    Comment: occ   Drug use: No   Sexual activity: Yes    Birth control/protection: None  Other Topics Concern   Not on file  Social History Narrative   Not on file   Social Determinants of Health   Financial Resource Strain: Low Risk  (03/05/2021)   Overall Financial Resource Strain (CARDIA)    Difficulty of Paying Living Expenses: Not very hard  Food Insecurity: Food Insecurity Present (03/05/2021)   Hunger Vital Sign    Worried About Running Out of Food in the Last Year: Sometimes true    Ran Out of Food in the Last Year: Sometimes true  Transportation Needs: No Transportation Needs (03/05/2021)   PRAPARE - Administrator, Civil Service (Medical): No    Lack of Transportation (Non-Medical): No  Physical Activity: Sufficiently Active (03/05/2021)   Exercise Vital Sign    Days of Exercise per Week: 7 days    Minutes of Exercise per Session: 60 min  Stress: No Stress Concern Present (03/05/2021)   [employer] of Occupational Health - Occupational Stress Questionnaire    Feeling of Stress : Not at all  Social Connections: Moderately Isolated (03/05/2021)   Social Connection and Isolation Panel [NHANES]    Frequency of Communication with Friends and Family: Once a week    Frequency of Social Gatherings with Friends and Family: Once a week    Attends Religious Services: More than 4 times per year    Active Member of [employer] or Organizations: Yes    Attends Engineer, structural: More than 4 times per year    Marital Status: Separated     FAMILY HISTORY:  We obtained a detailed, 4-generation family history.   Significant diagnoses are listed below: Family History  Problem Relation Age of Onset   Burnett disease Mother    Burnett attack Mother    Breast cancer Mother 42   Urticaria Mother    Renal cancer Father 26       metastatic   Breast cancer Maternal Aunt 38   Breast cancer Maternal Aunt 68   Breast cancer Paternal Grandmother 69   Lung cancer Paternal Grandfather        smoked   Emphysema Paternal Grandfather       Mariah Burnett's mother was diagnosed with breast cancer at age 18, she died due to CHF at age 77. Two maternal aunts have a history of breast cancer diagnosed at ages 27 and 35. Her father was diagnosed with renal cancer at age 59 and died due to metastatic  renal cancer at age 77. Mariah Burnett paternal grandmother was diagnosed with breast cancer at age 33, she died in her 64s. Her paternal grandfather was diagnosed with lung cancer at an unknown age, he smoked and was a veteran, he is deceased. Mariah Burnett is unaware of previous family history of genetic testing for hereditary cancer risks. There is no reported Ashkenazi Jewish ancestry.   GENETIC COUNSELING ASSESSMENT: Mariah Burnett is a 47 y.o. female with a personal and family history of cancer which is somewhat suggestive of a hereditary predisposition to cancer given her young age at diagnosis. We, therefore, discussed and recommended the following at today's visit.   DISCUSSION: We discussed that 5 - 10% of cancer is hereditary, with most cases of breast cancer associated with BRCA1/2.  There are other genes that can be associated with hereditary breast cancer syndromes.  We discussed that testing is beneficial for several reasons including knowing how to follow individuals after completing their treatment and understanding if other family members could be at risk for cancer and allowing them to undergo genetic testing.   We reviewed the characteristics, features and inheritance patterns of hereditary cancer syndromes. We also discussed  genetic testing, including the appropriate family members to test, the process of testing, insurance coverage and turn-around-time for results. We discussed the implications of a negative, positive, carrier and/or variant of uncertain significant result.   We discussed the option of updated testing to include RNA analysis and renal cancer genes that were not included on her prior genetic test. We discussed that the testing yield is very low, but it is an option if she is interested. She is very motivated to be proactive with her health and opted to pursue updated testing.   Mariah Burnett  was offered a common hereditary cancer panel (46 genes) and an expanded pan-cancer panel (71 genes). Mariah Burnett was informed of the benefits and limitations of each panel, including that expanded pan-cancer panels contain genes that do not have clear management guidelines at this point in time.  We also discussed that as the number of genes included on a panel increases, the chances of variants of uncertain significance increases. After considering the benefits and limitations of each gene panel, Mariah Burnett elected to have [employer].  The CancerNext+RenalNext gene panel offered by [employer] includes sequencing, rearrangement analysis, and RNA analysis for the following 46 genes:   APC, ATM, AXIN2, BAP1, BARD1, BMPR1A, BRCA1, BRCA2, BRIP1, CDH1, CDK4, CDKN2A, CHEK2, DICER1, FH, FLCN, HOXB13, EPCAM, GREM1, MET, MITF, MLH1, MSH2, MSH3, MSH6, MUTYH, NF1, NTHL1, PALB2, PMS2, POLD1, POLE, PTEN, RAD51C, RAD51D, SDHA, SDHB, SDHC, SDHD, SMAD4, SMARCA4, STK11, TP53, TSC1, TSC2, and VHL.   Based on Mariah Burnett's personal and family history of cancer, she meets medical criteria for genetic testing. We discussed testing may not be covered because she had negative genetic testing in 2014. We discussed that if her out of pocket cost for testing is over $100, the laboratory will call and confirm whether she wants to  proceed with testing.  If the out of pocket cost of testing is less than $100 she will be billed by the genetic testing laboratory.   PLAN: After considering the risks, benefits, and limitations, Mariah Burnett provided informed consent to pursue genetic testing and the blood sample was sent to [facility] for analysis of the CancerNext+RenalNext. Results should be available within approximately 2-3 weeks' time, at which point they will be disclosed by telephone to Mariah Burnett, as  will any additional recommendations warranted by these results. Mariah Burnett will receive a summary of her genetic counseling visit and a copy of her results once available. This information will also be available in Epic.   Mariah Burnett questions were answered to her satisfaction today. Our contact information was provided should additional questions or concerns arise. Thank you for the referral and allowing [place] to share in the care of your patient.   Mariah Brothers, MS, [facility] Genetic Counselor [email or web address] (P) [phone]  The patient was seen for a total of 30 minutes in face-to-face genetic counseling.  The patient was seen alone.  Drs. Pamelia Hoit and/or Mosetta Putt were available to discuss this case as needed.   _______________________________________________________________________ For Office Staff:  Number of people involved in session: 1 Was an Intern/ student involved with case: no

## 2022-07-16 ENCOUNTER — Other Ambulatory Visit: Payer: Self-pay | Admitting: Internal Medicine

## 2022-07-16 DIAGNOSIS — C50912 Malignant neoplasm of unspecified site of left female breast: Secondary | ICD-10-CM | POA: Diagnosis not present

## 2022-07-16 LAB — ECHOCARDIOGRAM COMPLETE
AR max vel: 2.24 cm2
AV Area VTI: 2.28 cm2
AV Area mean vel: 2.65 cm2
AV Mean grad: 4 mmHg
AV Peak grad: 8.2 mmHg
Ao pk vel: 1.43 m/s
Area-P 1/2: 2.91 cm2
Calc EF: 62.2 %
MV VTI: 2.88 cm2
S' Lateral: 2.4 cm
Single Plane A2C EF: 52.6 %
Single Plane A4C EF: 65.4 %

## 2022-07-17 ENCOUNTER — Ambulatory Visit: Payer: BC Managed Care – PPO

## 2022-07-17 DIAGNOSIS — Z170 Estrogen receptor positive status [ER+]: Secondary | ICD-10-CM | POA: Diagnosis not present

## 2022-07-17 DIAGNOSIS — C50512 Malignant neoplasm of lower-outer quadrant of left female breast: Secondary | ICD-10-CM | POA: Diagnosis not present

## 2022-07-17 DIAGNOSIS — M25612 Stiffness of left shoulder, not elsewhere classified: Secondary | ICD-10-CM

## 2022-07-17 DIAGNOSIS — M25512 Pain in left shoulder: Secondary | ICD-10-CM

## 2022-07-17 DIAGNOSIS — R293 Abnormal posture: Secondary | ICD-10-CM | POA: Diagnosis not present

## 2022-07-17 NOTE — Therapy (Signed)
OUTPATIENT PHYSICAL THERAPY BREAST CANCER TREATMENT   Patient Name: Mariah Burnett MRN: [id number] DOB:1975-04-17, 47 y.o., female Today's Date: 07/17/2022  END OF SESSION:  PT End of Session - 07/17/22 1608     Visit Number 11    Number of Visits 19    Date for PT Re-Evaluation 08/18/22    PT Start Time 1608    PT Stop Time 1705    PT Time Calculation (min) 57 min    Activity Tolerance Patient tolerated treatment well    Behavior During Therapy [facility] for tasks assessed/performed               Past Medical History:  Diagnosis Date   Arthritis    Asthma    Breast cancer (HCC) 04/24/2022   MVP (mitral valve prolapse)    Although no evidence per echo in 2009. Trace MR   Palpitation    Scoliosis    Urticaria    Vaginal Pap smear, abnormal    Past Surgical History:  Procedure Laterality Date   BREAST BIOPSY Left 04/24/2022   [place] LT BREAST BX W LOC DEV 1ST LESION IMG BX SPEC US GUIDE 04/24/2022 [facility]   BREAST BIOPSY Left 04/24/2022   [place] LT BREAST BX W LOC DEV EA ADD LESION IMG BX SPEC US GUIDE 04/24/2022 [facility]-ULTRASOUND   BREAST BIOPSY Left 06/04/2022   [place] LT RADIOACTIVE SEED LOC 06/04/2022 [facility]   BREAST BIOPSY Left 06/04/2022   [place] LT RADIOACTIVE SEED EA ADD LESION 06/04/2022 [facility]   BREAST LUMPECTOMY WITH RADIOACTIVE SEED AND SENTINEL LYMPH NODE BIOPSY Left 06/06/2022   Procedure: LEFT BREAST LUMPECTOMY WITH RAADIOACTIVE SEED AND SENTINEL LYMPH NODE BIOPSY;  Surgeon: Griselda Miner, MD;  Location: [facility];  Service: General;  Laterality: Left;   KIDNEY STONE SURGERY Bilateral 2000   RADIOACTIVE SEED GUIDED AXILLARY SENTINEL LYMPH NODE Left 06/06/2022   Procedure: RADIOACTIVE SEED GUIDED LEFT AXILLARY SENTINEL LYMPH NODE DISSECTION;  Surgeon: Griselda Miner, MD;  Location: [facility];  Service: General;  Laterality: Left;   WISDOM TOOTH EXTRACTION Bilateral 1991   Patient Active Problem List   Diagnosis Date Noted    Family history of breast cancer 07/15/2022   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 TOTH  REFERRING DIAG: C50.512 (ICD-10-CM) - Malignant neoplasm of lower-outer quadrant of left female breast Z17.0 (ICD-10-CM) - Estrogen receptor positive status (ER+)   THERAPY DIAG:  Stiffness of left shoulder, not elsewhere classified  Acute pain of left shoulder  Abnormal posture  Rationale for Evaluation and Treatment: Rehabilitation  ONSET DATE: 04/24/22  SUBJECTIVE:  SUBJECTIVE STATEMENT: I start radiation next Wed. My shoulder had been hurting really bad, so I have rested it. It is still sore but not as bad. I still have some cording and I would like to work on that today. My boyfriend thinks my breast is swollen.   PERTINENT HISTORY:  Lt lumpectomy due to [facility] 06/06/22. 1/4+ nodes. Radiation planned.   PATIENT GOALS:  Reassess how my recovery is going related to arm function, pain, and swelling.  PAIN:  Are you having pain? Yes: NPRS scale: 2/10 Pain location: back of the arm,  medial arm and elbow. It might be the cording, also shoulder Pain description: nerve pain Aggravating factors: sleeping  Relieving  factors: medication    PRECAUTIONS: Recent Surgery, left UE Lymphedema risk  ACTIVITY LEVEL / LEISURE: Not back to normal activities: I haven't gotten back to walking, and other exercises. Not back to work yet 06/30/22.     OBJECTIVE:   PATIENT SURVEYS:  QUICK DASH: 45% from 59%   OBSERVATIONS: Still wearing compression.  Breast incisions look well healed with glue still present.  Slight general edema to the breast.   POSTURE:  Guarded   LYMPHEDEMA ASSESSMENT:   UPPER EXTREMITY AROM/PROM:   A/PROM RIGHT   eval 166  Shoulder extension 66  Shoulder flexion 166  Shoulder abduction 176  Shoulder internal rotation 59  Shoulder external rotation 95                          (Blank rows = not tested)   A/PROM LEFT   eval 06/20/22 06/30/22  Shoulder extension 41 40 35  Shoulder flexion 118 135 - pull in breast and arm 155  Shoulder abduction 149 150 164  Shoulder internal rotation Unable due to P!    Shoulder external rotation Unable due to P! 25 pn                           (Blank rows = not tested)   UPPER EXTREMITY STRENGTH: did not assess due to pain   LYMPHEDEMA ASSESSMENTS:    LANDMARK RIGHT   eval  10 cm proximal to olecranon process 27.2  Olecranon process 24.1  10 cm proximal to ulnar styloid process 20.7  Just proximal to ulnar styloid process 15  Across hand at thumb web space 18.5  At base of 2nd digit 5.5  (Blank rows = not tested)   LANDMARK LEFT   eval  10 cm proximal to olecranon process 27  Olecranon process 25  10 cm proximal to ulnar styloid process 20.2  Just proximal to ulnar styloid process 15  Across hand at thumb web space 18.2  At base of 2nd digit 5.5  (Blank rows = not tested)  TODAY'S TREATMENT:  07/17/2022 Checked left breast no enlarged pores but with generalized swelling with a hint of mild redness but no tenderness. Pt advised to wear compression bra GR 3 GH mobs post and inferior MFR to cording in left UE axilla, upper arm,  forearm. 1 pop felt in axilla PROM left shoulder flexion scaption, abd, ER Supine alphabet 1#  A-Z,  supine scapular rythmic stabs at 90 degrees x 30 sec with PT resistance SL ER 1# x 15 to neutral Standing extension and row with 1# x 10 4D ball stabs on wall x 10 ea   07/14/2022 Pulleys flexion and abduction x each  Ball up wall x  10 reps in direction of flexion and 10 reps in direction of L shoulder abduction Red band row x 10 Red band ext x 10 bil Pt having AC joint ttp and pain with horizontal adduction in the same spot so started [facility] joint inferior and AP mobilizations grade Iv4x20" each  GH AP and inferior mob STM in Rt pectoralis, anterior shoulder, deltoid, supraspinatus insertion  07/09/2022 Pulleys flexion and abduction x each  Ball up wall x 10 reps in direction of flexion and 10 reps in direction of L shoulder abduction with no pain MFR to Left UE cording at approx 120 deg abd with numerous cords palpable but becoming more difficult to palpate by end of session STM in R side lying to L scapular muscles and L lats and upper traps to decrease tightness with increased tightness noted at L lats  07/07/2022 Pulleys flexion and abduction x each  Supine wand flex and scaption x 3, emphasis on scapular depression Gr 3/4 post, ant  and inferior mobs Stargazer stretch x 3 TFM to left prox biceps tendon MFR to Left UE cording at approx 120 deg abd with pillow under arm. Started very gently due to pt discomfort. STM to left pectorals, lateral trunk and UT with cocoa butter PROM to left shoulder flex, scaption, abd, IR and ER SL sleeper stretch for IR and ER x 2-3 ea Educated pt in gentle ice for anterior shoulder/ TG soft for cording to return to wearing   PATIENT EDUCATION:  Education details: tennis ball on wall for self massage Person educated: Patient Education method: Medical illustrator Education comprehension: verbalized understanding  HOME EXERCISE  PROGRAM: Reviewed previously given post op HEP which includes frozen shoulder stretches of: supine flexion, chest stretch, sleeper stretch, and wall walking Tennis ball on wall for self massage  ASSESSMENT:  CLINICAL IMPRESSION:  Pt did very well today. No complaints of pain in shoulder with release techniques, ROM or strength exercises. 1 pop noted in axilla when releasing cords. Pts left breast swollen and she was advised to wear compression bra now and for duration of radiation as long as possible.   Pt will benefit from skilled therapeutic intervention to improve on the following deficits: Decreased knowledge of precautions, impaired UE functional use, pain, decreased ROM, postural dysfunction.   PT treatment/interventions: ADL/Self care home management, Therapeutic exercises, Patient/Family education, Self Care, Joint mobilization, DME instructions, Manual therapy, and Re-evaluation   GOALS: Goals reviewed with patient? Yes  LONG TERM GOALS:  (STG=LTG)  GOALS Name Target Date  Goal status  1 Pt will demonstrate she has regained full shoulder ROM and function post operatively compared to baselines.  Baseline: 06/20/22 - met but will aim for improvements for radiation INITIAL  2 Pt will improve Left shoulder ROM to obtain radiation position with comfort  MET  3 Pt will be ind with final HEP for shoulder mobility   ONGOING  4        PLAN:  PT FREQUENCY/DURATION: 1-2x per week for 4 weeks  PLAN FOR NEXT SESSION: consider MLD for breast swelling, progress strength, how is tennis ball working? resume Lt frozen shoulder visits to prepare for radiation. Healed well from lumpectomy, MFR to axillary/upper arm cording, mobs, PROM   [facility]  [address], [address]  [place] [address]  [phone]    Waynette Buttery, PT 07/17/2022, 5:19 PM

## 2022-07-18 ENCOUNTER — Ambulatory Visit (INDEPENDENT_AMBULATORY_CARE_PROVIDER_SITE_OTHER): Payer: BC Managed Care – PPO

## 2022-07-18 DIAGNOSIS — Z510 Encounter for antineoplastic radiation therapy: Secondary | ICD-10-CM | POA: Diagnosis not present

## 2022-07-18 DIAGNOSIS — N6002 Solitary cyst of left breast: Secondary | ICD-10-CM | POA: Diagnosis not present

## 2022-07-18 DIAGNOSIS — N6001 Solitary cyst of right breast: Secondary | ICD-10-CM | POA: Diagnosis not present

## 2022-07-18 DIAGNOSIS — Z8042 Family history of malignant neoplasm of prostate: Secondary | ICD-10-CM | POA: Diagnosis not present

## 2022-07-18 DIAGNOSIS — Z170 Estrogen receptor positive status [ER+]: Secondary | ICD-10-CM | POA: Diagnosis not present

## 2022-07-18 DIAGNOSIS — J309 Allergic rhinitis, unspecified: Secondary | ICD-10-CM

## 2022-07-18 DIAGNOSIS — C773 Secondary and unspecified malignant neoplasm of axilla and upper limb lymph nodes: Secondary | ICD-10-CM | POA: Diagnosis not present

## 2022-07-18 DIAGNOSIS — I341 Nonrheumatic mitral (valve) prolapse: Secondary | ICD-10-CM | POA: Diagnosis not present

## 2022-07-18 DIAGNOSIS — C50512 Malignant neoplasm of lower-outer quadrant of left female breast: Secondary | ICD-10-CM | POA: Diagnosis not present

## 2022-07-18 DIAGNOSIS — Z803 Family history of malignant neoplasm of breast: Secondary | ICD-10-CM | POA: Diagnosis not present

## 2022-07-21 ENCOUNTER — Encounter: Payer: Self-pay | Admitting: *Deleted

## 2022-07-21 ENCOUNTER — Ambulatory Visit: Payer: BC Managed Care – PPO

## 2022-07-21 DIAGNOSIS — M25612 Stiffness of left shoulder, not elsewhere classified: Secondary | ICD-10-CM | POA: Diagnosis not present

## 2022-07-21 DIAGNOSIS — Z170 Estrogen receptor positive status [ER+]: Secondary | ICD-10-CM

## 2022-07-21 DIAGNOSIS — C50512 Malignant neoplasm of lower-outer quadrant of left female breast: Secondary | ICD-10-CM | POA: Diagnosis not present

## 2022-07-21 DIAGNOSIS — R293 Abnormal posture: Secondary | ICD-10-CM | POA: Diagnosis not present

## 2022-07-21 DIAGNOSIS — M25512 Pain in left shoulder: Secondary | ICD-10-CM

## 2022-07-21 NOTE — Therapy (Addendum)
OUTPATIENT PHYSICAL THERAPY BREAST CANCER TREATMENT   Patient Name: Mariah Burnett MRN: [id number] DOB:05/20/1975, 47 y.o., female Today's Date: 07/21/2022  END OF SESSION:  PT End of Session - 07/21/22 1620     Visit Number 12    Number of Visits 19    Date for PT Re-Evaluation 08/18/22    PT Start Time 1612    PT Stop Time 1701    PT Time Calculation (min) 49 min    Activity Tolerance Patient tolerated treatment well    Behavior During Therapy [facility] for tasks assessed/performed               Past Medical History:  Diagnosis Date   Arthritis    Asthma    Breast cancer (HCC) 04/24/2022   MVP (mitral valve prolapse)    Although no evidence per echo in 2009. Trace MR   Palpitation    Scoliosis    Urticaria    Vaginal Pap smear, abnormal    Past Surgical History:  Procedure Laterality Date   BREAST BIOPSY Left 04/24/2022   [place] LT BREAST BX W LOC DEV 1ST LESION IMG BX SPEC US GUIDE 04/24/2022 [facility]   BREAST BIOPSY Left 04/24/2022   [place] LT BREAST BX W LOC DEV EA ADD LESION IMG BX SPEC US GUIDE 04/24/2022 [facility]ULTRASOUND   BREAST BIOPSY Left 06/04/2022   [place] LT RADIOACTIVE SEED LOC 06/04/2022 [facility]MO[facility]AP[facility]   BREAST BIOPSY Left 06/04/2022   [place] LT RADIOACTIVE SEED EA ADD LESION 06/04/2022 [facility]AMMOGRAPHY   BREAST LUMPECTOMY WITH RADIOACTIVE SEED AND SENTINEL LYMPH NODE BIOPSY Left 06/06/2022   Procedure: LEFT BREAST LUMPECTOMY WITH RAADIOACTIVE SEED AND SENTINEL LYMPH NODE BIOPSY;  Surgeon: Griselda Miner, MD;  Location: [facility];  Service: General;  Laterality: Left;   KIDNEY STONE SURGERY Bilateral 2000   RADIOACTIVE SEED GUIDED AXILLARY SENTINEL LYMPH NODE Left 06/06/2022   Procedure: RADIOACTIVE SEED GUIDED LEFT AXILLARY SENTINEL LYMPH NODE DISSECTION;  Surgeon: Griselda Miner, MD;  Location: [facility];  Service: General;  Laterality: Left;   WISDOM TOOTH EXTRACTION Bilateral 1991   Patient Active Problem List   Diagnosis Date Noted    Family history of breast cancer 07/15/2022   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 TOTH  REFERRING DIAG: C50.512 (ICD-10-CM) - Malignant neoplasm of lower-outer quadrant of left female breast Z17.0 (ICD-10-CM) - Estrogen receptor positive status (ER+)   THERAPY DIAG:  Stiffness of left shoulder, not elsewhere classified  Acute pain of left shoulder  Abnormal posture  Malignant neoplasm of lower-outer quadrant of left breast of female, estrogen receptor positive (HCC)  Rationale for Evaluation and Treatment: Rehabilitation  ONSET DATE: 04/24/22  SUBJECTIVE:  SUBJECTIVE STATEMENT: I've been wearing the compression bra at night and my breast isn't as swollen as it was before but it's not completely gone.  PERTINENT HISTORY:  Lt lumpectomy due to [facility] 06/06/22. 1/4+ nodes. Radiation planned.   PATIENT GOALS:  Reassess how my recovery is going related to arm function, pain, and swelling.  PAIN:  Are you having pain? Yes: NPRS scale: 2/10 Pain location: back of the arm,  medial arm and elbow. It might be the cording, also shoulder Pain description: nerve pain Aggravating factors: sleeping  Relieving  factors: medication    PRECAUTIONS: Recent Surgery, left UE Lymphedema risk  ACTIVITY LEVEL / LEISURE: Not back to normal activities: I haven't gotten back to walking, and other exercises. Not back to work yet 06/30/22.     OBJECTIVE:   PATIENT SURVEYS:  QUICK DASH: 45% from 59%   OBSERVATIONS: Still wearing compression.  Breast incisions look well healed with glue still present.  Slight general edema to the breast.   POSTURE:  Guarded   LYMPHEDEMA ASSESSMENT:   UPPER EXTREMITY AROM/PROM:   A/PROM RIGHT   eval 166  Shoulder extension 66  Shoulder flexion 166  Shoulder abduction 176  Shoulder internal rotation 59  Shoulder external rotation 95                          (Blank rows = not tested)   A/PROM LEFT   eval 06/20/22 06/30/22  Shoulder extension 41 40 35  Shoulder flexion 118 135 - pull in breast and arm 155  Shoulder abduction 149 150 164  Shoulder internal rotation Unable due to P!    Shoulder external rotation Unable due to P! 25 pn                           (Blank rows = not tested)   UPPER EXTREMITY STRENGTH: did not assess due to pain   LYMPHEDEMA ASSESSMENTS:    LANDMARK RIGHT   eval  10 cm proximal to olecranon process 27.2  Olecranon process 24.1  10 cm proximal to ulnar styloid process 20.7  Just proximal to ulnar styloid process 15  Across hand at thumb web space 18.5  At base of 2nd digit 5.5  (Blank rows = not tested)   LANDMARK LEFT   eval  10 cm proximal to olecranon process 27  Olecranon process 25  10 cm proximal to ulnar styloid process 20.2  Just proximal to ulnar styloid process 15  Across hand at thumb web space 18.2  At base of 2nd digit 5.5  (Blank rows = not tested)   L-DEX FLOWSHEETS - 07/21/22 1700       L-DEX LYMPHEDEMA SCREENING   Measurement Type Unilateral    L-DEX MEASUREMENT EXTREMITY Upper Extremity    POSITION  Standing    DOMINANT SIDE Right    At Risk Side Left    BASELINE SCORE (UNILATERAL) 0.6    L-DEX  SCORE (UNILATERAL) 0.9    VALUE CHANGE (UNILAT) 0.3             TODAY'S TREATMENT: 07/21/22: Manual Therapy MLD: Short neck, 5 diaphragmatic breaths, Lt inguinal and Rt axillary nodes, Lt axillo-inguinal and anterior inter-axillary anastomosis then focused on Lt breast beginning to instruct pt in basics of anatomy of lymphatic system and principles of MLD.  P/ROM of Lt shoulder into flexion and abd with scapular depression during by therapist SOZO  redone, WNLs  07/17/2022 Checked left breast no enlarged pores but with generalized swelling with a hint of mild redness but no tenderness. Pt advised to wear compression bra GR 3 GH mobs post and inferior MFR to cording in left UE axilla, upper arm, forearm. 1 pop felt in axilla PROM left shoulder flexion scaption, abd, ER Supine alphabet 1#  A-Z,  supine scapular rythmic stabs at 90 degrees x 30 sec with PT resistance SL ER 1# x 15 to neutral Standing extension and row with 1# x 10 4D ball stabs on wall x 10 ea   07/14/2022 Pulleys flexion and abduction x each  Ball up wall x 10 reps in direction of flexion and 10 reps in direction of L shoulder abduction Red band row x 10 Red band ext x 10 bil Pt having AC joint ttp and pain with horizontal adduction in the same spot so started [facility] joint inferior and AP mobilizations grade Iv4x20" each  GH AP and inferior mob STM in Rt pectoralis, anterior shoulder, deltoid, supraspinatus insertion     PATIENT EDUCATION:  Education details: tennis ball on wall for self massage Person educated: Patient Education method: Medical illustrator Education comprehension: verbalized understanding  HOME EXERCISE PROGRAM: Reviewed previously given post op HEP which includes frozen shoulder stretches of: supine flexion, chest stretch, sleeper stretch, and wall walking Tennis ball on wall for self massage  ASSESSMENT:  CLINICAL IMPRESSION:  Began MLD of Lt breast today. Instructed pt in  this while performing. Also redid SOZO which was WNLs. Encouraged pt to wear compression bra 24/7 as able and she verbalized understanding. Very mild edema in breast palpable but pt starts radiation this week so it will be beneficial for pt to learn this,    Pt will benefit from skilled therapeutic intervention to improve on the following deficits: Decreased knowledge of precautions, impaired UE functional use, pain, decreased ROM, postural dysfunction.   PT treatment/interventions: ADL/Self care home management, Therapeutic exercises, Patient/Family education, Self Care, Joint mobilization, DME instructions, Manual therapy, and Re-evaluation   GOALS: Goals reviewed with patient? Yes  LONG TERM GOALS:  (STG=LTG)  GOALS Name Target Date  Goal status  1 Pt will demonstrate she has regained full shoulder ROM and function post operatively compared to baselines.  Baseline: 06/20/22 - met but will aim for improvements for radiation INITIAL  2 Pt will improve Left shoulder ROM to obtain radiation position with comfort  MET  3 Pt will be ind with final HEP for shoulder mobility   ONGOING  4        PLAN:  PT FREQUENCY/DURATION: 1-2x per week for 4 weeks  PLAN FOR NEXT SESSION: Instruct pt in self MLD for Lt breast, progress strength, how is tennis ball working? resume Lt frozen shoulder visits to prepare for radiation. Healed well from lumpectomy, MFR to axillary/upper arm cording, mobs, PROM   [facility]  [address]  [place] [address]  [phone]    Hermenia Bers, PTA 07/21/2022, 5:12 PM

## 2022-07-22 DIAGNOSIS — C773 Secondary and unspecified malignant neoplasm of axilla and upper limb lymph nodes: Secondary | ICD-10-CM | POA: Diagnosis not present

## 2022-07-22 DIAGNOSIS — N6002 Solitary cyst of left breast: Secondary | ICD-10-CM | POA: Diagnosis not present

## 2022-07-22 DIAGNOSIS — Z8042 Family history of malignant neoplasm of prostate: Secondary | ICD-10-CM | POA: Diagnosis not present

## 2022-07-22 DIAGNOSIS — Z510 Encounter for antineoplastic radiation therapy: Secondary | ICD-10-CM | POA: Diagnosis not present

## 2022-07-22 DIAGNOSIS — Z803 Family history of malignant neoplasm of breast: Secondary | ICD-10-CM | POA: Diagnosis not present

## 2022-07-22 DIAGNOSIS — I341 Nonrheumatic mitral (valve) prolapse: Secondary | ICD-10-CM | POA: Diagnosis not present

## 2022-07-22 DIAGNOSIS — N6001 Solitary cyst of right breast: Secondary | ICD-10-CM | POA: Diagnosis not present

## 2022-07-22 DIAGNOSIS — Z170 Estrogen receptor positive status [ER+]: Secondary | ICD-10-CM | POA: Diagnosis not present

## 2022-07-22 DIAGNOSIS — C50512 Malignant neoplasm of lower-outer quadrant of left female breast: Secondary | ICD-10-CM | POA: Diagnosis not present

## 2022-07-23 ENCOUNTER — Telehealth: Payer: Self-pay | Admitting: Internal Medicine

## 2022-07-23 DIAGNOSIS — I788 Other diseases of capillaries: Secondary | ICD-10-CM | POA: Diagnosis not present

## 2022-07-23 DIAGNOSIS — L821 Other seborrheic keratosis: Secondary | ICD-10-CM | POA: Diagnosis not present

## 2022-07-23 DIAGNOSIS — Z85828 Personal history of other malignant neoplasm of skin: Secondary | ICD-10-CM | POA: Diagnosis not present

## 2022-07-23 DIAGNOSIS — D2272 Melanocytic nevi of left lower limb, including hip: Secondary | ICD-10-CM | POA: Diagnosis not present

## 2022-07-23 NOTE — Telephone Encounter (Signed)
Mallipeddi, Vishnu P, MD: Normal pumping function of the heart and no valvular heart disease   Left a message for patient to call office back regarding testing results/swelling

## 2022-07-23 NOTE — Telephone Encounter (Signed)
MyChart message sent to pt regarding echo results.

## 2022-07-23 NOTE — Telephone Encounter (Signed)
 Patient notified and verbalized understanding. 

## 2022-07-23 NOTE — Telephone Encounter (Signed)
Spoke to patient who verbalized understanding of result note. Pt stated that on 7/6 she had taken a family trip to [place] and had noticed later that afternoon that her ankles were both swollen. Pt is unsure if this was caused by the heat or by her recent surgery. Pt stated that when she returned home that evening, she elevated her feet and by the next morning her swelling had completely gone. Pt has not had issues with it since. Pt denies sob/cp.  Please advise.

## 2022-07-23 NOTE — Telephone Encounter (Signed)
Left a message for patient to call office back regarding providers recommendations.

## 2022-07-23 NOTE — Telephone Encounter (Signed)
Patient calling to speak to the nurse about her test results.   Pt c/o swelling: STAT is pt has developed SOB within 24 hours  How much weight have you gained and in what time span? Yes but last couple of months  If swelling, where is the swelling located? In both ankles  Are you currently taking a fluid pill? no  Are you currently SOB? no  Do you have a log of your daily weights (if so, list)?    Have you gained 3 pounds in a day or 5 pounds in a week? no  Have you traveled recently? no

## 2022-07-24 DIAGNOSIS — C773 Secondary and unspecified malignant neoplasm of axilla and upper limb lymph nodes: Secondary | ICD-10-CM | POA: Diagnosis not present

## 2022-07-24 DIAGNOSIS — Z170 Estrogen receptor positive status [ER+]: Secondary | ICD-10-CM | POA: Diagnosis not present

## 2022-07-24 DIAGNOSIS — Z8042 Family history of malignant neoplasm of prostate: Secondary | ICD-10-CM | POA: Diagnosis not present

## 2022-07-24 DIAGNOSIS — Z803 Family history of malignant neoplasm of breast: Secondary | ICD-10-CM | POA: Diagnosis not present

## 2022-07-24 DIAGNOSIS — I341 Nonrheumatic mitral (valve) prolapse: Secondary | ICD-10-CM | POA: Diagnosis not present

## 2022-07-24 DIAGNOSIS — N6002 Solitary cyst of left breast: Secondary | ICD-10-CM | POA: Diagnosis not present

## 2022-07-24 DIAGNOSIS — Z510 Encounter for antineoplastic radiation therapy: Secondary | ICD-10-CM | POA: Diagnosis not present

## 2022-07-24 DIAGNOSIS — C50512 Malignant neoplasm of lower-outer quadrant of left female breast: Secondary | ICD-10-CM | POA: Diagnosis not present

## 2022-07-24 DIAGNOSIS — N6001 Solitary cyst of right breast: Secondary | ICD-10-CM | POA: Diagnosis not present

## 2022-07-25 ENCOUNTER — Encounter: Payer: Self-pay | Admitting: Genetic Counselor

## 2022-07-25 ENCOUNTER — Telehealth: Payer: Self-pay | Admitting: Genetic Counselor

## 2022-07-25 DIAGNOSIS — N6001 Solitary cyst of right breast: Secondary | ICD-10-CM | POA: Diagnosis not present

## 2022-07-25 DIAGNOSIS — C773 Secondary and unspecified malignant neoplasm of axilla and upper limb lymph nodes: Secondary | ICD-10-CM | POA: Diagnosis not present

## 2022-07-25 DIAGNOSIS — Z510 Encounter for antineoplastic radiation therapy: Secondary | ICD-10-CM | POA: Diagnosis not present

## 2022-07-25 DIAGNOSIS — Z803 Family history of malignant neoplasm of breast: Secondary | ICD-10-CM | POA: Diagnosis not present

## 2022-07-25 DIAGNOSIS — Z8042 Family history of malignant neoplasm of prostate: Secondary | ICD-10-CM | POA: Diagnosis not present

## 2022-07-25 DIAGNOSIS — Z1379 Encounter for other screening for genetic and chromosomal anomalies: Secondary | ICD-10-CM | POA: Insufficient documentation

## 2022-07-25 DIAGNOSIS — Z170 Estrogen receptor positive status [ER+]: Secondary | ICD-10-CM | POA: Diagnosis not present

## 2022-07-25 DIAGNOSIS — N6002 Solitary cyst of left breast: Secondary | ICD-10-CM | POA: Diagnosis not present

## 2022-07-25 DIAGNOSIS — I341 Nonrheumatic mitral (valve) prolapse: Secondary | ICD-10-CM | POA: Diagnosis not present

## 2022-07-25 DIAGNOSIS — C50512 Malignant neoplasm of lower-outer quadrant of left female breast: Secondary | ICD-10-CM | POA: Diagnosis not present

## 2022-07-25 NOTE — Telephone Encounter (Signed)
I contacted Ms. Abreu to discuss her genetic testing results. No pathogenic variants were identified in the 46 genes analyzed. Detailed clinic note to follow.  The test report has been scanned into EPIC and is located under the Molecular Pathology section of the Results Review tab.  A portion of the result report is included below for reference.   Lalla Brothers, MS, [facility] Genetic Counselor [email or web address] (P) [phone]

## 2022-07-28 ENCOUNTER — Encounter: Payer: Self-pay | Admitting: Genetic Counselor

## 2022-07-28 ENCOUNTER — Ambulatory Visit: Payer: Self-pay | Admitting: Genetic Counselor

## 2022-07-28 DIAGNOSIS — Z8042 Family history of malignant neoplasm of prostate: Secondary | ICD-10-CM | POA: Diagnosis not present

## 2022-07-28 DIAGNOSIS — N6001 Solitary cyst of right breast: Secondary | ICD-10-CM | POA: Diagnosis not present

## 2022-07-28 DIAGNOSIS — N6002 Solitary cyst of left breast: Secondary | ICD-10-CM | POA: Diagnosis not present

## 2022-07-28 DIAGNOSIS — Z170 Estrogen receptor positive status [ER+]: Secondary | ICD-10-CM | POA: Diagnosis not present

## 2022-07-28 DIAGNOSIS — Z1379 Encounter for other screening for genetic and chromosomal anomalies: Secondary | ICD-10-CM

## 2022-07-28 DIAGNOSIS — I341 Nonrheumatic mitral (valve) prolapse: Secondary | ICD-10-CM | POA: Diagnosis not present

## 2022-07-28 DIAGNOSIS — Z803 Family history of malignant neoplasm of breast: Secondary | ICD-10-CM | POA: Diagnosis not present

## 2022-07-28 DIAGNOSIS — Z510 Encounter for antineoplastic radiation therapy: Secondary | ICD-10-CM | POA: Diagnosis not present

## 2022-07-28 DIAGNOSIS — C50512 Malignant neoplasm of lower-outer quadrant of left female breast: Secondary | ICD-10-CM | POA: Diagnosis not present

## 2022-07-28 DIAGNOSIS — C773 Secondary and unspecified malignant neoplasm of axilla and upper limb lymph nodes: Secondary | ICD-10-CM | POA: Diagnosis not present

## 2022-07-28 NOTE — Progress Notes (Signed)
HPI:   Ms. Skilling was previously seen in the [facility] due to a personal and family history of cancer and concerns regarding a hereditary predisposition to cancer. Please refer to our prior cancer genetics clinic note for more information regarding our discussion, assessment and recommendations, at the time. Ms. Tartt recent genetic test results were disclosed to her, as were recommendations warranted by these results. These results and recommendations are discussed in more detail below.  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)  04/24/2022 Initial Diagnosis   Left breast mass at 4 o'clock position 1.8 cm, 1 abnormal lymph node (positive), MRI revealed 2.3 cm mass: Biopsy: Grade 2 IDC with papillary features ER 80%, PR 100%, Ki-67 10%, HER2 0 negative   05/12/2022 Cancer Staging   Staging form: Breast, AJCC 8th Edition - Clinical stage from 05/12/2022: Stage IIA (cT2, cN1(f), cM0, G2, ER+, PR+, HER2-) - Signed by Ronny Bacon, PA-C on 05/14/2022 Stage prefix: Initial diagnosis Method of lymph node assessment: Core biopsy Histologic grading system: 3 grade system   06/06/2022 Surgery   Left breast lumpectomy: IDC, 1.6 cm, grade 2, margins negative, 1/4 LN + carcinoma.    06/06/2022 Oncotype testing   3/2%   06/06/2022 Cancer Staging   Staging form: Breast, AJCC 8th Edition - Pathologic stage from 06/06/2022: Stage IA (pT1c, pN1a, cM0, G2, ER+, PR+, HER2-, Oncotype DX score: 3) - Signed by Loa Socks, NP on 06/19/2022 Stage prefix: Initial diagnosis Multigene prognostic tests performed: Oncotype DX Recurrence score range: Less than 11 Histologic grading system: 3 grade system    Genetic Testing   [facility] CancerNext+RenalNext Panel+RNA was Negative. Report date is 07/23/2022.  The CancerNext+RenalNext gene panel offered by [employer] includes sequencing, rearrangement analysis, and RNA  analysis for the following 46 genes:   APC, ATM, AXIN2, BAP1, BARD1, BMPR1A, BRCA1, BRCA2, BRIP1, CDH1, CDK4, CDKN2A, CHEK2, DICER1, FH, FLCN, HOXB13, EPCAM, GREM1, MET, MITF, MLH1, MSH2, MSH3, MSH6, MUTYH, NF1, NTHL1, PALB2, PMS2, POLD1, POLE, PTEN, RAD51C, RAD51D, SDHA, SDHB, SDHC, SDHD, SMAD4, SMARCA4, STK11, TP53, TSC1, TSC2, and VHL.       FAMILY HISTORY:  We obtained a detailed, 4-generation family history.  Significant diagnoses are listed below:      Family History  Problem Relation Age of Onset   Heart disease Mother     Heart attack Mother     Breast cancer Mother 24   Urticaria Mother     Renal cancer Father 47        metastatic   Breast cancer Maternal Aunt 30   Breast cancer Maternal Aunt 93   Breast cancer Paternal Grandmother 44   Lung cancer Paternal Grandfather          smoked   Emphysema Paternal Grandfather               Ms. Napier mother was diagnosed with breast cancer at age 51, she died due to CHF at age 74. Two maternal aunts have a history of breast cancer diagnosed at ages 52 and 33. Her father was diagnosed with renal cancer at age 59 and died due to metastatic renal cancer at age 49. Ms. Lordi paternal grandmother was diagnosed with breast cancer at age 74, she died in her 76s. Her paternal grandfather was diagnosed with lung cancer at an unknown age, he smoked and was a veteran, he is deceased. Ms. Dudziak is unaware of previous family history of  genetic testing for hereditary cancer risks. There is no reported Ashkenazi Jewish ancestry.   GENETIC TEST RESULTS:  The [facility] Custom Panel (CancerNext+RenalNext) found no pathogenic mutations.   The CancerNext+RenalNext gene panel offered by [employer] includes sequencing, rearrangement analysis, and RNA analysis for the following 46 genes: APC, ATM, AXIN2, BAP1, BARD1, BMPR1A, BRCA1, BRCA2, BRIP1, CDH1, CDK4, CDKN2A, CHEK2, DICER1, FH, FLCN, HOXB13, EPCAM, GREM1, MET, MITF, MLH1, MSH2, MSH3, MSH6, MUTYH,  NF1, NTHL1, PALB2, PMS2, POLD1, POLE, PTEN, RAD51C, RAD51D, SDHA, SDHB, SDHC, SDHD, SMAD4, SMARCA4, STK11, TP53, TSC1, TSC2, and VHL.    The test report has been scanned into EPIC and is located under the Molecular Pathology section of the Results Review tab.  A portion of the result report is included below for reference. Genetic testing reported out on 07/23/2022.       Even though a pathogenic variant was not identified, possible explanations for the cancer in the family may include: There may be no hereditary risk for cancer in the family. The cancers in Ms. Karapetian and/or her family may be due to other genetic or environmental factors. There may be a gene mutation in one of these genes that current testing methods cannot detect, but that chance is small. There could be another gene that has not yet been discovered, or that we have not yet tested, that is responsible for the cancer diagnoses in the family.  It is also possible there is a hereditary cause for the cancer in the family that Ms. Giuliano did not inherit.  Therefore, it is important to remain in touch with cancer genetics in the future so that we can continue to offer Ms. Malmquist the most up to date genetic testing.   ADDITIONAL GENETIC TESTING:  We discussed with Ms. Holquin that her genetic testing was fairly extensive.  If there are genes identified to increase cancer risk that can be analyzed in the future, we would be happy to discuss and coordinate this testing at that time.    CANCER SCREENING RECOMMENDATIONS:  Ms. Eshleman test result is considered negative (normal).  This means that we have not identified a hereditary cause for her personal and family history of cancer at this time.   An individual's cancer risk and medical management are not determined by genetic test results alone. Overall cancer risk assessment incorporates additional factors, including personal medical history, family history, and any available genetic  information that may result in a personalized plan for cancer prevention and surveillance. Therefore, it is recommended she continue to follow the cancer management and screening guidelines provided by her oncology and primary healthcare provider.  RECOMMENDATIONS FOR FAMILY MEMBERS:   Individuals in this family might be at some increased risk of developing cancer, over the general population risk, due to the family history of cancer. We recommend women in this family have a yearly mammogram beginning at age 28, or 31 years younger than the earliest onset of cancer, an annual clinical breast exam, and perform monthly breast self-exams.  Other members of the family may still carry a pathogenic variant in one of these genes that Ms. Shinault did not inherit. Based on the family history, we recommend her maternal aunt, who was diagnosed with breast cancer have genetic counseling and testing.   FOLLOW-UP:  Cancer genetics is a rapidly advancing field and it is possible that new genetic tests will be appropriate for her and/or her family members in the future. We encouraged her to remain in contact with cancer  genetics on an annual basis so we can update her personal and family histories and let her know of advances in cancer genetics that may benefit this family.   Our contact number was provided. Ms. Boccio questions were answered to her satisfaction, and she knows she is welcome to call us at anytime with additional questions or concerns.   Lalla Brothers, MS, [facility] Genetic Counselor [email or web address] (P) [phone]

## 2022-07-29 ENCOUNTER — Ambulatory Visit: Payer: BC Managed Care – PPO | Admitting: Rehabilitation

## 2022-07-29 ENCOUNTER — Encounter: Payer: Self-pay | Admitting: Rehabilitation

## 2022-07-29 DIAGNOSIS — N6002 Solitary cyst of left breast: Secondary | ICD-10-CM | POA: Diagnosis not present

## 2022-07-29 DIAGNOSIS — N6001 Solitary cyst of right breast: Secondary | ICD-10-CM | POA: Diagnosis not present

## 2022-07-29 DIAGNOSIS — M25512 Pain in left shoulder: Secondary | ICD-10-CM

## 2022-07-29 DIAGNOSIS — C50512 Malignant neoplasm of lower-outer quadrant of left female breast: Secondary | ICD-10-CM | POA: Diagnosis not present

## 2022-07-29 DIAGNOSIS — R293 Abnormal posture: Secondary | ICD-10-CM | POA: Diagnosis not present

## 2022-07-29 DIAGNOSIS — M25612 Stiffness of left shoulder, not elsewhere classified: Secondary | ICD-10-CM | POA: Diagnosis not present

## 2022-07-29 DIAGNOSIS — Z803 Family history of malignant neoplasm of breast: Secondary | ICD-10-CM | POA: Diagnosis not present

## 2022-07-29 DIAGNOSIS — Z8042 Family history of malignant neoplasm of prostate: Secondary | ICD-10-CM | POA: Diagnosis not present

## 2022-07-29 DIAGNOSIS — Z510 Encounter for antineoplastic radiation therapy: Secondary | ICD-10-CM | POA: Diagnosis not present

## 2022-07-29 DIAGNOSIS — Z170 Estrogen receptor positive status [ER+]: Secondary | ICD-10-CM | POA: Diagnosis not present

## 2022-07-29 DIAGNOSIS — I341 Nonrheumatic mitral (valve) prolapse: Secondary | ICD-10-CM | POA: Diagnosis not present

## 2022-07-29 DIAGNOSIS — C773 Secondary and unspecified malignant neoplasm of axilla and upper limb lymph nodes: Secondary | ICD-10-CM | POA: Diagnosis not present

## 2022-07-29 NOTE — Therapy (Signed)
OUTPATIENT PHYSICAL THERAPY BREAST CANCER TREATMENT   Patient Name: Mariah Burnett MRN: [id number] DOB:July 22, 1975, 47 y.o., female Today's Date: 07/29/2022  END OF SESSION:  PT End of Session - 07/29/22 1558     Visit Number 13    Number of Visits 19    Date for PT Re-Evaluation 08/18/22    PT Start Time 1600    Activity Tolerance Patient tolerated treatment well    Behavior During Therapy [facility] for tasks assessed/performed               Past Medical History:  Diagnosis Date   Arthritis    Asthma    Breast cancer (HCC) 04/24/2022   MVP (mitral valve prolapse)    Although no evidence per echo in 2009. Trace MR   Palpitation    Scoliosis    Urticaria    Vaginal Pap smear, abnormal    Past Surgical History:  Procedure Laterality Date   BREAST BIOPSY Left 04/24/2022   [place] LT BREAST BX W LOC DEV 1ST LESION IMG BX SPEC US GUIDE 04/24/2022 [facility]   BREAST BIOPSY Left 04/24/2022   [place] LT BREAST BX W LOC DEV EA ADD LESION IMG BX SPEC US GUIDE 04/24/2022 [facility]   BREAST BIOPSY Left 06/04/2022   [place] LT RADIOACTIVE SEED LOC 06/04/2022 [facility]   BREAST BIOPSY Left 06/04/2022   [place] LT RADIOACTIVE SEED EA ADD LESION 06/04/2022 [facility]   BREAST LUMPECTOMY WITH RADIOACTIVE SEED AND SENTINEL LYMPH NODE BIOPSY Left 06/06/2022   Procedure: LEFT BREAST LUMPECTOMY WITH RAADIOACTIVE SEED AND SENTINEL LYMPH NODE BIOPSY;  Surgeon: Griselda Miner, MD;  Location: [facility];  Service: General;  Laterality: Left;   KIDNEY STONE SURGERY Bilateral 2000   RADIOACTIVE SEED GUIDED AXILLARY SENTINEL LYMPH NODE Left 06/06/2022   Procedure: RADIOACTIVE SEED GUIDED LEFT AXILLARY SENTINEL LYMPH NODE DISSECTION;  Surgeon: Griselda Miner, MD;  Location:  SURGERY CENTER;  Service: General;  Laterality: Left;   WISDOM TOOTH EXTRACTION Bilateral 1991   Patient Active Problem List   Diagnosis Date Noted   Genetic testing 07/25/2022   Family history of breast  cancer 07/15/2022   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 TOTH  REFERRING DIAG: C50.512 (ICD-10-CM) - Malignant neoplasm of lower-outer quadrant of left female breast Z17.0 (ICD-10-CM) - Estrogen receptor positive status (ER+)   THERAPY DIAG:  Stiffness of left shoulder, not elsewhere classified  Acute pain of left shoulder  Abnormal posture  Malignant neoplasm of lower-outer quadrant of left breast of female, estrogen receptor positive (HCC)  Rationale for Evaluation and Treatment: Rehabilitation  ONSET DATE: 04/24/22  SUBJECTIVE:  SUBJECTIVE STATEMENT:   PERTINENT HISTORY:  Lt lumpectomy due to [facility] 06/06/22. 1/4+ nodes. Radiation planned.   PATIENT GOALS:  Reassess how my recovery is going related to arm function, pain, and swelling.  PAIN:  Are you having pain? Yes: NPRS scale: 2/10 Pain location: back of the arm,  medial arm and elbow. It might be the cording, also shoulder Pain description: nerve pain Aggravating factors: sleeping  Relieving factors: medication    PRECAUTIONS: Recent Surgery, left UE Lymphedema risk  ACTIVITY LEVEL / LEISURE: Not back to normal activities: I haven't gotten back  to walking, and other exercises. Not back to work yet 06/30/22.     OBJECTIVE:   PATIENT SURVEYS:  QUICK DASH: 45% from 59%   OBSERVATIONS: Still wearing compression.  Breast incisions look well healed with glue still present.  Slight general edema to the breast.   POSTURE:  Guarded   LYMPHEDEMA ASSESSMENT:   UPPER EXTREMITY AROM/PROM:   A/PROM RIGHT   eval 166  Shoulder extension 66  Shoulder flexion 166  Shoulder abduction 176  Shoulder internal rotation 59  Shoulder external rotation 95                          (Blank rows = not tested)   A/PROM LEFT   eval 06/20/22 06/30/22  Shoulder extension 41 40 35  Shoulder flexion 118 135 - pull in breast and arm 155  Shoulder abduction 149 150 164  Shoulder internal rotation Unable due to P!    Shoulder external rotation Unable due to P! 25 pn                           (Blank rows = not tested)   UPPER EXTREMITY STRENGTH: did not assess due to pain   LYMPHEDEMA ASSESSMENTS:    LANDMARK RIGHT   eval  10 cm proximal to olecranon process 27.2  Olecranon process 24.1  10 cm proximal to ulnar styloid process 20.7  Just proximal to ulnar styloid process 15  Across hand at thumb web space 18.5  At base of 2nd digit 5.5  (Blank rows = not tested)   LANDMARK LEFT   eval  10 cm proximal to olecranon process 27  Olecranon process 25  10 cm proximal to ulnar styloid process 20.2  Just proximal to ulnar styloid process 15  Across hand at thumb web space 18.2  At base of 2nd digit 5.5  (Blank rows = not tested)  TODAY'S TREATMENT: Pt permission and consent throughout each step of examination and treatment with modification and draping if requested when working on sensitive areas  07/29/22: Manual Therapy Lt breast MLD with education and handout given after education: Short neck, 5 diaphragmatic breaths, Lt inguinal and bil axillary nodes, Lt axillo-inguinal and anterior inter-axillary anastomosis then focused on Lt breast  with hand over hand assistance for the breast portion.  Included the left arm but did not educate patient on this part.  P/ROM of Lt shoulder into flexion and abd  Cording work left medial upper arm  07/21/22: Manual Therapy MLD: Short neck, 5 diaphragmatic breaths, Lt inguinal and Rt axillary nodes, Lt axillo-inguinal and anterior inter-axillary anastomosis then focused on Lt breast beginning to instruct pt in basics of anatomy of lymphatic system and principles of MLD.  P/ROM of Lt shoulder into flexion and abd with scapular depression during by therapist SOZO redone, WNLs  07/17/2022 Checked  left breast no enlarged pores but with generalized swelling with a hint of mild redness but no tenderness. Pt advised to wear compression bra GR 3 GH mobs post and inferior MFR to cording in left UE axilla, upper arm, forearm. 1 pop felt in axilla PROM left shoulder flexion scaption, abd, ER Supine alphabet 1#  A-Z,  supine scapular rythmic stabs at 90 degrees x 30 sec with PT resistance SL ER 1# x 15 to neutral Standing extension and row with 1# x 10 4D ball stabs on wall x 10 ea  PATIENT EDUCATION:  Education details: tennis ball on wall for self massage Person educated: Patient Education method: Medical illustrator Education comprehension: verbalized understanding  HOME EXERCISE PROGRAM: Reviewed previously given post op HEP which includes frozen shoulder stretches of: supine flexion, chest stretch, sleeper stretch, and wall walking Tennis ball on wall for self massage  ASSESSMENT:  CLINICAL IMPRESSION:  Continued MLD of Lt breast today. Left breast is now swollen and red due to radiation. Instructed pt in this while performing. Recommended a sleeve with info on a special place due to cording and radiation in the axilla.      Pt will benefit from skilled therapeutic intervention to improve on the following deficits: Decreased knowledge of precautions, impaired UE functional use,  pain, decreased ROM, postural dysfunction.   PT treatment/interventions: ADL/Self care home management, Therapeutic exercises, Patient/Family education, Self Care, Joint mobilization, DME instructions, Manual therapy, and Re-evaluation   GOALS: Goals reviewed with patient? Yes  LONG TERM GOALS:  (STG=LTG)  GOALS Name Target Date  Goal status  1 Pt will demonstrate she has regained full shoulder ROM and function post operatively compared to baselines.  Baseline: 06/20/22 - met but will aim for improvements for radiation INITIAL  2 Pt will improve Left shoulder ROM to obtain radiation position with comfort  MET  3 Pt will be ind with final HEP for shoulder mobility   ONGOING  4        PLAN:  PT FREQUENCY/DURATION: 1-2x per week for 4 weeks  PLAN FOR NEXT SESSION: Review self MLD for Lt breast, MFR to axillary/upper arm cording, mobs, PROM (may need to alter treatment due to radiation redness)   [facility]  [address]  [place] [address]  [phone]    Idamae Lusher, PT 07/29/2022, 3:58 PM

## 2022-07-30 DIAGNOSIS — Z8042 Family history of malignant neoplasm of prostate: Secondary | ICD-10-CM | POA: Diagnosis not present

## 2022-07-30 DIAGNOSIS — Z170 Estrogen receptor positive status [ER+]: Secondary | ICD-10-CM | POA: Diagnosis not present

## 2022-07-30 DIAGNOSIS — N6002 Solitary cyst of left breast: Secondary | ICD-10-CM | POA: Diagnosis not present

## 2022-07-30 DIAGNOSIS — N6001 Solitary cyst of right breast: Secondary | ICD-10-CM | POA: Diagnosis not present

## 2022-07-30 DIAGNOSIS — Z803 Family history of malignant neoplasm of breast: Secondary | ICD-10-CM | POA: Diagnosis not present

## 2022-07-30 DIAGNOSIS — I341 Nonrheumatic mitral (valve) prolapse: Secondary | ICD-10-CM | POA: Diagnosis not present

## 2022-07-30 DIAGNOSIS — C773 Secondary and unspecified malignant neoplasm of axilla and upper limb lymph nodes: Secondary | ICD-10-CM | POA: Diagnosis not present

## 2022-07-30 DIAGNOSIS — Z510 Encounter for antineoplastic radiation therapy: Secondary | ICD-10-CM | POA: Diagnosis not present

## 2022-07-30 DIAGNOSIS — C50512 Malignant neoplasm of lower-outer quadrant of left female breast: Secondary | ICD-10-CM | POA: Diagnosis not present

## 2022-07-31 DIAGNOSIS — C773 Secondary and unspecified malignant neoplasm of axilla and upper limb lymph nodes: Secondary | ICD-10-CM | POA: Diagnosis not present

## 2022-07-31 DIAGNOSIS — Z510 Encounter for antineoplastic radiation therapy: Secondary | ICD-10-CM | POA: Diagnosis not present

## 2022-07-31 DIAGNOSIS — Z170 Estrogen receptor positive status [ER+]: Secondary | ICD-10-CM | POA: Diagnosis not present

## 2022-07-31 DIAGNOSIS — I341 Nonrheumatic mitral (valve) prolapse: Secondary | ICD-10-CM | POA: Diagnosis not present

## 2022-07-31 DIAGNOSIS — Z803 Family history of malignant neoplasm of breast: Secondary | ICD-10-CM | POA: Diagnosis not present

## 2022-07-31 DIAGNOSIS — Z8042 Family history of malignant neoplasm of prostate: Secondary | ICD-10-CM | POA: Diagnosis not present

## 2022-07-31 DIAGNOSIS — N6002 Solitary cyst of left breast: Secondary | ICD-10-CM | POA: Diagnosis not present

## 2022-07-31 DIAGNOSIS — N6001 Solitary cyst of right breast: Secondary | ICD-10-CM | POA: Diagnosis not present

## 2022-07-31 DIAGNOSIS — C50512 Malignant neoplasm of lower-outer quadrant of left female breast: Secondary | ICD-10-CM | POA: Diagnosis not present

## 2022-08-01 DIAGNOSIS — I341 Nonrheumatic mitral (valve) prolapse: Secondary | ICD-10-CM | POA: Diagnosis not present

## 2022-08-01 DIAGNOSIS — Z803 Family history of malignant neoplasm of breast: Secondary | ICD-10-CM | POA: Diagnosis not present

## 2022-08-01 DIAGNOSIS — C50512 Malignant neoplasm of lower-outer quadrant of left female breast: Secondary | ICD-10-CM | POA: Diagnosis not present

## 2022-08-01 DIAGNOSIS — N6002 Solitary cyst of left breast: Secondary | ICD-10-CM | POA: Diagnosis not present

## 2022-08-01 DIAGNOSIS — Z170 Estrogen receptor positive status [ER+]: Secondary | ICD-10-CM | POA: Diagnosis not present

## 2022-08-01 DIAGNOSIS — N6001 Solitary cyst of right breast: Secondary | ICD-10-CM | POA: Diagnosis not present

## 2022-08-01 DIAGNOSIS — C773 Secondary and unspecified malignant neoplasm of axilla and upper limb lymph nodes: Secondary | ICD-10-CM | POA: Diagnosis not present

## 2022-08-01 DIAGNOSIS — Z510 Encounter for antineoplastic radiation therapy: Secondary | ICD-10-CM | POA: Diagnosis not present

## 2022-08-01 DIAGNOSIS — Z8042 Family history of malignant neoplasm of prostate: Secondary | ICD-10-CM | POA: Diagnosis not present

## 2022-08-04 ENCOUNTER — Ambulatory Visit: Payer: BC Managed Care – PPO

## 2022-08-04 DIAGNOSIS — M25612 Stiffness of left shoulder, not elsewhere classified: Secondary | ICD-10-CM | POA: Diagnosis not present

## 2022-08-04 DIAGNOSIS — N6001 Solitary cyst of right breast: Secondary | ICD-10-CM | POA: Diagnosis not present

## 2022-08-04 DIAGNOSIS — C773 Secondary and unspecified malignant neoplasm of axilla and upper limb lymph nodes: Secondary | ICD-10-CM | POA: Diagnosis not present

## 2022-08-04 DIAGNOSIS — Z803 Family history of malignant neoplasm of breast: Secondary | ICD-10-CM | POA: Diagnosis not present

## 2022-08-04 DIAGNOSIS — Z170 Estrogen receptor positive status [ER+]: Secondary | ICD-10-CM

## 2022-08-04 DIAGNOSIS — R293 Abnormal posture: Secondary | ICD-10-CM

## 2022-08-04 DIAGNOSIS — N6002 Solitary cyst of left breast: Secondary | ICD-10-CM | POA: Diagnosis not present

## 2022-08-04 DIAGNOSIS — M25512 Pain in left shoulder: Secondary | ICD-10-CM | POA: Diagnosis not present

## 2022-08-04 DIAGNOSIS — I341 Nonrheumatic mitral (valve) prolapse: Secondary | ICD-10-CM | POA: Diagnosis not present

## 2022-08-04 DIAGNOSIS — C50512 Malignant neoplasm of lower-outer quadrant of left female breast: Secondary | ICD-10-CM | POA: Diagnosis not present

## 2022-08-04 DIAGNOSIS — Z8042 Family history of malignant neoplasm of prostate: Secondary | ICD-10-CM | POA: Diagnosis not present

## 2022-08-04 DIAGNOSIS — Z510 Encounter for antineoplastic radiation therapy: Secondary | ICD-10-CM | POA: Diagnosis not present

## 2022-08-04 NOTE — Therapy (Signed)
OUTPATIENT PHYSICAL THERAPY BREAST CANCER TREATMENT   Patient Name: Mariah Burnett MRN: [id number] DOB:[birth year], 47 y.o., female Today's Date: 08/04/2022  END OF SESSION:  PT End of Session - 08/04/22 1608     Visit Number 14    Number of Visits 19    Date for PT Re-Evaluation 08/18/22    PT Start Time 1604    PT Stop Time 1708    PT Time Calculation (min) 64 min    Activity Tolerance Patient tolerated treatment well    Behavior During Therapy [facility] for tasks assessed/performed               Past Medical History:  Diagnosis Date   Arthritis    Asthma    Breast cancer (HCC) 04/24/2022   MVP (mitral valve prolapse)    Although no evidence per echo in 2009. Trace MR   Palpitation    Scoliosis    Urticaria    Vaginal Pap smear, abnormal    Past Surgical History:  Procedure Laterality Date   BREAST BIOPSY Left 04/24/2022   [place] LT BREAST BX W LOC DEV 1ST LESION IMG BX SPEC US GUIDE 04/24/2022 [facility]   BREAST BIOPSY Left 04/24/2022   [place] LT BREAST BX W LOC DEV EA ADD LESION IMG BX SPEC US GUIDE 04/24/2022 [facility]   BREAST BIOPSY Left 06/04/2022   [place] LT RADIOACTIVE SEED LOC 06/04/2022 [facility]   BREAST BIOPSY Left 06/04/2022   [place] LT RADIOACTIVE SEED EA ADD LESION 06/04/2022 [facility]   BREAST LUMPECTOMY WITH RADIOACTIVE SEED AND SENTINEL LYMPH NODE BIOPSY Left 06/06/2022   Procedure: LEFT BREAST LUMPECTOMY WITH RAADIOACTIVE SEED AND SENTINEL LYMPH NODE BIOPSY;  Surgeon: Griselda Miner, MD;  Location: [facility];  Service: General;  Laterality: Left;   KIDNEY STONE SURGERY Bilateral 2000   RADIOACTIVE SEED GUIDED AXILLARY SENTINEL LYMPH NODE Left 06/06/2022   Procedure: RADIOACTIVE SEED GUIDED LEFT AXILLARY SENTINEL LYMPH NODE DISSECTION;  Surgeon: Griselda Miner, MD;  Location: [facility];  Service: General;  Laterality: Left;   WISDOM TOOTH EXTRACTION Bilateral 1991   Patient Active Problem List   Diagnosis Date Noted    Genetic testing 07/25/2022   Family history of breast cancer 07/15/2022   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 TOTH  REFERRING DIAG: C50.512 (ICD-10-CM) - Malignant neoplasm of lower-outer quadrant of left female breast Z17.0 (ICD-10-CM) - Estrogen receptor positive status (ER+)   THERAPY DIAG:  Stiffness of left shoulder, not elsewhere classified  Acute pain of left shoulder  Abnormal posture  Malignant neoplasm of lower-outer quadrant of left breast of female, estrogen receptor positive (HCC)  Rationale for Evaluation and Treatment: Rehabilitation  ONSET DATE: 04/24/22  SUBJECTIVE:  SUBJECTIVE STATEMENT: I saw the nurse Friday and she told me the blisters were just my hair follicles flared up. She had me put petroleum jelly around my breast and I used nonadhesive pads and it's better today.   PERTINENT HISTORY:  Lt lumpectomy due to [facility] 06/06/22. 1/4+ nodes. Radiation planned.   PATIENT GOALS:  Reassess how my recovery is going related to arm function, pain, and swelling.  PAIN:  Are you having pain? No, just light irritation to the Lt breast  PRECAUTIONS: Recent Surgery, left UE Lymphedema  risk  ACTIVITY LEVEL / LEISURE: Not back to normal activities: I haven't gotten back to walking, and other exercises. Not back to work yet 06/30/22.     OBJECTIVE:   PATIENT SURVEYS:  QUICK DASH: 45% from 59%   OBSERVATIONS: Still wearing compression.  Breast incisions look well healed with glue still present.  Slight general edema to the breast.   POSTURE:  Guarded   LYMPHEDEMA ASSESSMENT:   UPPER EXTREMITY AROM/PROM:   A/PROM RIGHT   eval 166  Shoulder extension 66  Shoulder flexion 166  Shoulder abduction 176  Shoulder internal rotation 59  Shoulder external rotation 95                          (Blank rows = not tested)   A/PROM LEFT   eval 06/20/22 06/30/22  Shoulder extension 41 40 35  Shoulder flexion 118 135 - pull in breast and arm 155  Shoulder abduction 149 150 164  Shoulder internal rotation Unable due to P!    Shoulder external rotation Unable due to P! 25 pn                           (Blank rows = not tested)   UPPER EXTREMITY STRENGTH: did not assess due to pain   LYMPHEDEMA ASSESSMENTS:    LANDMARK RIGHT   eval  10 cm proximal to olecranon process 27.2  Olecranon process 24.1  10 cm proximal to ulnar styloid process 20.7  Just proximal to ulnar styloid process 15  Across hand at thumb web space 18.5  At base of 2nd digit 5.5  (Blank rows = not tested)   LANDMARK LEFT   eval  10 cm proximal to olecranon process 27  Olecranon process 25  10 cm proximal to ulnar styloid process 20.2  Just proximal to ulnar styloid process 15  Across hand at thumb web space 18.2  At base of 2nd digit 5.5  (Blank rows = not tested)  TODAY'S TREATMENT: Pt permission and consent throughout each step of examination and treatment with modification and draping if requested when working on sensitive areas 08/04/22: Manual Therapy Lt breast MLD with review of correct pressure and skin stretch: Short neck, 5 diaphragmatic breaths, Lt inguinal and bil axillary nodes,  Lt axillo-inguinal and anterior inter-axillary anastomosis then focused on Lt breast with hand over hand assistance for the breast portion.  Included the left arm but did not educate patient on this part.  P/ROM of Lt shoulder into flexion and abd and D2 with scapular depression by therapist throughout P/ROM MFR to medial upper arm and at forearm where palpable tightness but very mild cording  07/29/22: Manual Therapy Lt breast MLD with education and handout given after education: Short neck, 5 diaphragmatic breaths, Lt inguinal and bil axillary nodes, Lt axillo-inguinal and anterior inter-axillary anastomosis then focused on Lt breast with  hand over hand assistance for the breast portion.  Included the left arm but did not educate patient on this part.  P/ROM of Lt shoulder into flexion and abd  Cording work left medial upper arm  07/21/22: Manual Therapy MLD: Short neck, 5 diaphragmatic breaths, Lt inguinal and Rt axillary nodes, Lt axillo-inguinal and anterior inter-axillary anastomosis then focused on Lt breast beginning to instruct pt in basics of anatomy of lymphatic system and principles of MLD.  P/ROM of Lt shoulder into flexion and abd with scapular depression during by therapist SOZO redone, WNLs  07/17/2022 Checked left breast no enlarged pores but with generalized swelling with a hint of mild redness but no tenderness. Pt advised to wear compression bra GR 3 GH mobs post and inferior MFR to cording in left UE axilla, upper arm, forearm. 1 pop felt in axilla PROM left shoulder flexion scaption, abd, ER Supine alphabet 1#  A-Z,  supine scapular rythmic stabs at 90 degrees x 30 sec with PT resistance SL ER 1# x 15 to neutral Standing extension and row with 1# x 10 4D ball stabs on wall x 10 ea  PATIENT EDUCATION:  Education details: tennis ball on wall for self massage Person educated: Patient Education method: Medical illustrator Education comprehension: verbalized  understanding  HOME EXERCISE PROGRAM: Reviewed previously given post op HEP which includes frozen shoulder stretches of: supine flexion, chest stretch, sleeper stretch, and wall walking Tennis ball on wall for self massage  ASSESSMENT:  CLINICAL IMPRESSION:  Continued MLD of Lt breast today. Left breast is now swollen and red due to radiation. Reviewed correct pressure and skin stretch with pt while performing and encouraged her to try this at home as she hasn't yet due to be worried she won't do it correct.   Pt will benefit from skilled therapeutic intervention to improve on the following deficits: Decreased knowledge of precautions, impaired UE functional use, pain, decreased ROM, postural dysfunction.   PT treatment/interventions: ADL/Self care home management, Therapeutic exercises, Patient/Family education, Self Care, Joint mobilization, DME instructions, Manual therapy, and Re-evaluation   GOALS: Goals reviewed with patient? Yes  LONG TERM GOALS:  (STG=LTG)  GOALS Name Target Date  Goal status  1 Pt will demonstrate she has regained full shoulder ROM and function post operatively compared to baselines.  Baseline: 06/20/22 - met but will aim for improvements for radiation INITIAL  2 Pt will improve Left shoulder ROM to obtain radiation position with comfort  MET  3 Pt will be ind with final HEP for shoulder mobility   ONGOING  4        PLAN:  PT FREQUENCY/DURATION: 1-2x per week for 4 weeks  PLAN FOR NEXT SESSION: Review self MLD for Lt breast, MFR to axillary/upper arm cording, mobs, PROM (may need to alter treatment due to radiation redness)   [facility]  [address]  [place] [address]  [phone]    Hermenia Bers, PTA 08/04/2022, 5:10 PM

## 2022-08-05 ENCOUNTER — Telehealth: Payer: Self-pay | Admitting: *Deleted

## 2022-08-05 DIAGNOSIS — C773 Secondary and unspecified malignant neoplasm of axilla and upper limb lymph nodes: Secondary | ICD-10-CM | POA: Diagnosis not present

## 2022-08-05 DIAGNOSIS — C50512 Malignant neoplasm of lower-outer quadrant of left female breast: Secondary | ICD-10-CM | POA: Diagnosis not present

## 2022-08-05 DIAGNOSIS — I341 Nonrheumatic mitral (valve) prolapse: Secondary | ICD-10-CM | POA: Diagnosis not present

## 2022-08-05 DIAGNOSIS — Z8042 Family history of malignant neoplasm of prostate: Secondary | ICD-10-CM | POA: Diagnosis not present

## 2022-08-05 DIAGNOSIS — Z170 Estrogen receptor positive status [ER+]: Secondary | ICD-10-CM | POA: Diagnosis not present

## 2022-08-05 DIAGNOSIS — N6001 Solitary cyst of right breast: Secondary | ICD-10-CM | POA: Diagnosis not present

## 2022-08-05 DIAGNOSIS — N6002 Solitary cyst of left breast: Secondary | ICD-10-CM | POA: Diagnosis not present

## 2022-08-05 DIAGNOSIS — Z510 Encounter for antineoplastic radiation therapy: Secondary | ICD-10-CM | POA: Diagnosis not present

## 2022-08-05 DIAGNOSIS — Z803 Family history of malignant neoplasm of breast: Secondary | ICD-10-CM | POA: Diagnosis not present

## 2022-08-05 NOTE — Telephone Encounter (Signed)
DCP-001: Use of a Clinical Trial Screening Tool to Address Cancer Health Disparities in the [employer] Community Oncology Research Program (NCORP)   LVM for patient to follow up on the above study to see if she has any questions or would like to participate. Asked patient to return call by tomorrow if she would like to participate.  Domenica Reamer, BSN, RN, [employer] Nurse II [phone] 08/05/2022 3:16 PM

## 2022-08-06 DIAGNOSIS — Z8042 Family history of malignant neoplasm of prostate: Secondary | ICD-10-CM | POA: Diagnosis not present

## 2022-08-06 DIAGNOSIS — Z510 Encounter for antineoplastic radiation therapy: Secondary | ICD-10-CM | POA: Diagnosis not present

## 2022-08-06 DIAGNOSIS — C773 Secondary and unspecified malignant neoplasm of axilla and upper limb lymph nodes: Secondary | ICD-10-CM | POA: Diagnosis not present

## 2022-08-06 DIAGNOSIS — N6002 Solitary cyst of left breast: Secondary | ICD-10-CM | POA: Diagnosis not present

## 2022-08-06 DIAGNOSIS — Z803 Family history of malignant neoplasm of breast: Secondary | ICD-10-CM | POA: Diagnosis not present

## 2022-08-06 DIAGNOSIS — Z170 Estrogen receptor positive status [ER+]: Secondary | ICD-10-CM | POA: Diagnosis not present

## 2022-08-06 DIAGNOSIS — N6001 Solitary cyst of right breast: Secondary | ICD-10-CM | POA: Diagnosis not present

## 2022-08-06 DIAGNOSIS — I341 Nonrheumatic mitral (valve) prolapse: Secondary | ICD-10-CM | POA: Diagnosis not present

## 2022-08-06 DIAGNOSIS — C50512 Malignant neoplasm of lower-outer quadrant of left female breast: Secondary | ICD-10-CM | POA: Diagnosis not present

## 2022-08-07 DIAGNOSIS — Z510 Encounter for antineoplastic radiation therapy: Secondary | ICD-10-CM | POA: Diagnosis not present

## 2022-08-07 DIAGNOSIS — I341 Nonrheumatic mitral (valve) prolapse: Secondary | ICD-10-CM | POA: Diagnosis not present

## 2022-08-07 DIAGNOSIS — C50512 Malignant neoplasm of lower-outer quadrant of left female breast: Secondary | ICD-10-CM | POA: Diagnosis not present

## 2022-08-07 DIAGNOSIS — C773 Secondary and unspecified malignant neoplasm of axilla and upper limb lymph nodes: Secondary | ICD-10-CM | POA: Diagnosis not present

## 2022-08-07 DIAGNOSIS — Z803 Family history of malignant neoplasm of breast: Secondary | ICD-10-CM | POA: Diagnosis not present

## 2022-08-07 DIAGNOSIS — Z170 Estrogen receptor positive status [ER+]: Secondary | ICD-10-CM | POA: Diagnosis not present

## 2022-08-07 DIAGNOSIS — N6002 Solitary cyst of left breast: Secondary | ICD-10-CM | POA: Diagnosis not present

## 2022-08-07 DIAGNOSIS — Z8042 Family history of malignant neoplasm of prostate: Secondary | ICD-10-CM | POA: Diagnosis not present

## 2022-08-07 DIAGNOSIS — N6001 Solitary cyst of right breast: Secondary | ICD-10-CM | POA: Diagnosis not present

## 2022-08-08 ENCOUNTER — Telehealth: Payer: Self-pay

## 2022-08-08 DIAGNOSIS — N6001 Solitary cyst of right breast: Secondary | ICD-10-CM | POA: Diagnosis not present

## 2022-08-08 DIAGNOSIS — Z8042 Family history of malignant neoplasm of prostate: Secondary | ICD-10-CM | POA: Diagnosis not present

## 2022-08-08 DIAGNOSIS — Z510 Encounter for antineoplastic radiation therapy: Secondary | ICD-10-CM | POA: Diagnosis not present

## 2022-08-08 DIAGNOSIS — I341 Nonrheumatic mitral (valve) prolapse: Secondary | ICD-10-CM | POA: Diagnosis not present

## 2022-08-08 DIAGNOSIS — C773 Secondary and unspecified malignant neoplasm of axilla and upper limb lymph nodes: Secondary | ICD-10-CM | POA: Diagnosis not present

## 2022-08-08 DIAGNOSIS — Z803 Family history of malignant neoplasm of breast: Secondary | ICD-10-CM | POA: Diagnosis not present

## 2022-08-08 DIAGNOSIS — N6002 Solitary cyst of left breast: Secondary | ICD-10-CM | POA: Diagnosis not present

## 2022-08-08 DIAGNOSIS — Z170 Estrogen receptor positive status [ER+]: Secondary | ICD-10-CM | POA: Diagnosis not present

## 2022-08-08 DIAGNOSIS — C50512 Malignant neoplasm of lower-outer quadrant of left female breast: Secondary | ICD-10-CM | POA: Diagnosis not present

## 2022-08-08 NOTE — Telephone Encounter (Signed)
Pt called for appt with MD following rxt. Her last tx will be 8/15. She will f/u with Dr Pamelia Hoit 8/29 at 1100, injection for zoladex at 1130. Message sent to PA team to ensure PA obtained.

## 2022-08-11 ENCOUNTER — Ambulatory Visit: Payer: BC Managed Care – PPO

## 2022-08-11 DIAGNOSIS — I341 Nonrheumatic mitral (valve) prolapse: Secondary | ICD-10-CM | POA: Diagnosis not present

## 2022-08-11 DIAGNOSIS — Z510 Encounter for antineoplastic radiation therapy: Secondary | ICD-10-CM | POA: Diagnosis not present

## 2022-08-11 DIAGNOSIS — N6002 Solitary cyst of left breast: Secondary | ICD-10-CM | POA: Diagnosis not present

## 2022-08-11 DIAGNOSIS — N6001 Solitary cyst of right breast: Secondary | ICD-10-CM | POA: Diagnosis not present

## 2022-08-11 DIAGNOSIS — Z170 Estrogen receptor positive status [ER+]: Secondary | ICD-10-CM | POA: Diagnosis not present

## 2022-08-11 DIAGNOSIS — C50512 Malignant neoplasm of lower-outer quadrant of left female breast: Secondary | ICD-10-CM | POA: Diagnosis not present

## 2022-08-11 DIAGNOSIS — Z803 Family history of malignant neoplasm of breast: Secondary | ICD-10-CM | POA: Diagnosis not present

## 2022-08-11 DIAGNOSIS — C773 Secondary and unspecified malignant neoplasm of axilla and upper limb lymph nodes: Secondary | ICD-10-CM | POA: Diagnosis not present

## 2022-08-11 DIAGNOSIS — Z8042 Family history of malignant neoplasm of prostate: Secondary | ICD-10-CM | POA: Diagnosis not present

## 2022-08-12 DIAGNOSIS — N6001 Solitary cyst of right breast: Secondary | ICD-10-CM | POA: Diagnosis not present

## 2022-08-12 DIAGNOSIS — Z803 Family history of malignant neoplasm of breast: Secondary | ICD-10-CM | POA: Diagnosis not present

## 2022-08-12 DIAGNOSIS — I341 Nonrheumatic mitral (valve) prolapse: Secondary | ICD-10-CM | POA: Diagnosis not present

## 2022-08-12 DIAGNOSIS — Z8042 Family history of malignant neoplasm of prostate: Secondary | ICD-10-CM | POA: Diagnosis not present

## 2022-08-12 DIAGNOSIS — C773 Secondary and unspecified malignant neoplasm of axilla and upper limb lymph nodes: Secondary | ICD-10-CM | POA: Diagnosis not present

## 2022-08-12 DIAGNOSIS — Z510 Encounter for antineoplastic radiation therapy: Secondary | ICD-10-CM | POA: Diagnosis not present

## 2022-08-12 DIAGNOSIS — N6002 Solitary cyst of left breast: Secondary | ICD-10-CM | POA: Diagnosis not present

## 2022-08-12 DIAGNOSIS — C50512 Malignant neoplasm of lower-outer quadrant of left female breast: Secondary | ICD-10-CM | POA: Diagnosis not present

## 2022-08-12 DIAGNOSIS — Z170 Estrogen receptor positive status [ER+]: Secondary | ICD-10-CM | POA: Diagnosis not present

## 2022-08-13 ENCOUNTER — Ambulatory Visit: Payer: BC Managed Care – PPO | Attending: General Surgery

## 2022-08-14 DIAGNOSIS — Z803 Family history of malignant neoplasm of breast: Secondary | ICD-10-CM | POA: Diagnosis not present

## 2022-08-14 DIAGNOSIS — Z510 Encounter for antineoplastic radiation therapy: Secondary | ICD-10-CM | POA: Diagnosis not present

## 2022-08-14 DIAGNOSIS — N6002 Solitary cyst of left breast: Secondary | ICD-10-CM | POA: Diagnosis not present

## 2022-08-14 DIAGNOSIS — C50512 Malignant neoplasm of lower-outer quadrant of left female breast: Secondary | ICD-10-CM | POA: Diagnosis not present

## 2022-08-14 DIAGNOSIS — C773 Secondary and unspecified malignant neoplasm of axilla and upper limb lymph nodes: Secondary | ICD-10-CM | POA: Diagnosis not present

## 2022-08-14 DIAGNOSIS — I341 Nonrheumatic mitral (valve) prolapse: Secondary | ICD-10-CM | POA: Diagnosis not present

## 2022-08-14 DIAGNOSIS — Z170 Estrogen receptor positive status [ER+]: Secondary | ICD-10-CM | POA: Diagnosis not present

## 2022-08-14 DIAGNOSIS — N6001 Solitary cyst of right breast: Secondary | ICD-10-CM | POA: Diagnosis not present

## 2022-08-14 DIAGNOSIS — Z8042 Family history of malignant neoplasm of prostate: Secondary | ICD-10-CM | POA: Diagnosis not present

## 2022-08-15 DIAGNOSIS — Z510 Encounter for antineoplastic radiation therapy: Secondary | ICD-10-CM | POA: Diagnosis not present

## 2022-08-15 DIAGNOSIS — Z8042 Family history of malignant neoplasm of prostate: Secondary | ICD-10-CM | POA: Diagnosis not present

## 2022-08-15 DIAGNOSIS — C773 Secondary and unspecified malignant neoplasm of axilla and upper limb lymph nodes: Secondary | ICD-10-CM | POA: Diagnosis not present

## 2022-08-15 DIAGNOSIS — M7712 Lateral epicondylitis, left elbow: Secondary | ICD-10-CM | POA: Diagnosis not present

## 2022-08-15 DIAGNOSIS — I341 Nonrheumatic mitral (valve) prolapse: Secondary | ICD-10-CM | POA: Diagnosis not present

## 2022-08-15 DIAGNOSIS — Z803 Family history of malignant neoplasm of breast: Secondary | ICD-10-CM | POA: Diagnosis not present

## 2022-08-15 DIAGNOSIS — M7502 Adhesive capsulitis of left shoulder: Secondary | ICD-10-CM | POA: Diagnosis not present

## 2022-08-15 DIAGNOSIS — S56512A Strain of other extensor muscle, fascia and tendon at forearm level, left arm, initial encounter: Secondary | ICD-10-CM | POA: Diagnosis not present

## 2022-08-15 DIAGNOSIS — C50512 Malignant neoplasm of lower-outer quadrant of left female breast: Secondary | ICD-10-CM | POA: Diagnosis not present

## 2022-08-15 DIAGNOSIS — Z170 Estrogen receptor positive status [ER+]: Secondary | ICD-10-CM | POA: Diagnosis not present

## 2022-08-15 DIAGNOSIS — N6002 Solitary cyst of left breast: Secondary | ICD-10-CM | POA: Diagnosis not present

## 2022-08-15 DIAGNOSIS — N6001 Solitary cyst of right breast: Secondary | ICD-10-CM | POA: Diagnosis not present

## 2022-08-18 ENCOUNTER — Encounter: Payer: Self-pay | Admitting: Sports Medicine

## 2022-08-18 ENCOUNTER — Ambulatory Visit: Payer: BC Managed Care – PPO | Admitting: Sports Medicine

## 2022-08-18 ENCOUNTER — Other Ambulatory Visit: Payer: Self-pay

## 2022-08-18 DIAGNOSIS — C50512 Malignant neoplasm of lower-outer quadrant of left female breast: Secondary | ICD-10-CM | POA: Diagnosis not present

## 2022-08-18 DIAGNOSIS — Z803 Family history of malignant neoplasm of breast: Secondary | ICD-10-CM | POA: Diagnosis not present

## 2022-08-18 DIAGNOSIS — M7502 Adhesive capsulitis of left shoulder: Secondary | ICD-10-CM | POA: Diagnosis not present

## 2022-08-18 DIAGNOSIS — N6002 Solitary cyst of left breast: Secondary | ICD-10-CM | POA: Diagnosis not present

## 2022-08-18 DIAGNOSIS — N6001 Solitary cyst of right breast: Secondary | ICD-10-CM | POA: Diagnosis not present

## 2022-08-18 DIAGNOSIS — I341 Nonrheumatic mitral (valve) prolapse: Secondary | ICD-10-CM | POA: Diagnosis not present

## 2022-08-18 DIAGNOSIS — M7712 Lateral epicondylitis, left elbow: Secondary | ICD-10-CM

## 2022-08-18 DIAGNOSIS — Z170 Estrogen receptor positive status [ER+]: Secondary | ICD-10-CM | POA: Diagnosis not present

## 2022-08-18 DIAGNOSIS — Z510 Encounter for antineoplastic radiation therapy: Secondary | ICD-10-CM | POA: Diagnosis not present

## 2022-08-18 DIAGNOSIS — C773 Secondary and unspecified malignant neoplasm of axilla and upper limb lymph nodes: Secondary | ICD-10-CM | POA: Diagnosis not present

## 2022-08-18 DIAGNOSIS — S56512A Strain of other extensor muscle, fascia and tendon at forearm level, left arm, initial encounter: Secondary | ICD-10-CM

## 2022-08-18 DIAGNOSIS — Z8042 Family history of malignant neoplasm of prostate: Secondary | ICD-10-CM | POA: Diagnosis not present

## 2022-08-18 MED ORDER — METHYLPREDNISOLONE 4 MG PO TBPK: ORAL_TABLET | ORAL | 0 refills | Status: DC

## 2022-08-18 NOTE — Progress Notes (Addendum)
Mariah Burnett - 47 y.o. female MRN [id number]  Date of birth: 1975/07/26  Office Visit Note: Visit Date: 08/18/2022 PCP: Billie Lade, MD Referred by: Billie Lade, MD  Subjective: Chief Complaint  Patient presents with   Left Elbow - Pain   HPI: Mariah Burnett is a pleasant 47 y.o. female who presents today for acute on chronic left lateral elbow pain. Also follow-up of left shoulder adhesive capsulitis.  Pain has bothered her on and off for over a year.  Started after working out in [employer] and noting a pulling sensation. She did have a lateral epicondyle corticosteroid injection by Dr. Dallas Schimke on 01/28/2022.  Previously saw a chiropractor who tried a number of different treatment modalities including ultrasound treatment.  Using ice, Voltaren gel. Has done some home exercises, but no formal PT to date.  Left shoulder -history of adhesive capsulitis, did proceed with ultrasound-guided glenohumeral joint injection back in May which gave her rather excellent relief where we did not need to repeat high-volume injection.  She is currently undergoing chemotherapy and radiation.  Pertinent ROS were reviewed with the patient and found to be negative unless otherwise specified above in HPI.   Assessment & Plan: Visit Diagnoses:  1. Lateral epicondylitis of left elbow   2. Partial tear of common extensor tendon of left elbow   3. Adhesive capsulitis of left shoulder    Plan: Discussed with Glayds that she had an exacerbation of her left lateral elbow pain which does have a degree of epicondylitis as well as some small partial-thickness tearing that we did note on ultrasound.  I do not appreciate any high-grade tearing or any tendon retraction from the ultrasound today.  Discussed all treatment options such as formalized physical therapy, counterforce strap, oral medication therapy, PRP versus shockwave therapy.  We will start her back on her counterforce brace to be worn with activity only.   She will ice for 15 to 20 minutes at the end of the day.  Formalized PT referral was sent which she may begin at her leisure.  An attempt to help calm down the inflammation, we will start her on a 6-day methylprednisolone taper course.  She may then take ibuprofen, Aleve or Tylenol following this only as needed.  She is interested in trialing shockwave therapy, we will have her follow-up in 2 weeks for this initial evaluation.  Encouraged her to bring this in her for the frozen shoulder to continue to improve range of motion although largely this has improved.  Follow-up: Return in about 2 weeks (around 09/01/2022) for for L-lat elbow (SWT trial - reg visit).   Meds & Orders:  Meds ordered this encounter  Medications   methylPREDNISolone (MEDROL DOSEPAK) 4 MG TBPK tablet    Sig: Take per packet instructions. Taper dosing.    Dispense:  1 each    Refill:  0    Orders Placed This Encounter  Procedures   [place] Extrem Up Left Ltd   Ambulatory referral to Physical Therapy     Procedures: No procedures performed      Clinical History: No specialty comments available.  She reports that she has never smoked. She has never used smokeless tobacco. No results for input(s): "HGBA1C", "LABURIC" in the last 8760 hours.  Objective:   Vital Signs: There were no vitals taken for this visit.  Physical Exam  Gen: Well-appearing, in no acute distress; non-toxic CV:  Well-perfused. Warm.  Resp: Breathing unlabored on room air; no wheezing.  Psych: Fluid speech in conversation; appropriate affect; normal thought process Neuro: Sensation intact throughout. No gross coordination deficits.   Ortho Exam - Left shoulder: No bony TTP, no redness or swelling.  There is nearly equivocal range of motion of the left and right shoulder, and there is about 5 degrees less of internal and 80 degrees less of external rotation both passive and actively.  There is good strength with rotator cuff testing.  - Left elbow: +  Positive TTP over the lateral epicondyle and about 1-2 cm distal to this.  Positive Maudsley sign, + pain with resisted supination and pronation.  There is no restriction with flexion or extension at the elbow.  Imaging: [place] Extrem Up Left Ltd  Result Date: 08/18/2022 Limited musculoskeletal ultrasound of the left upper extremity, left elbow was performed today.  Lateral epicondyle was seen without cortical regularity.  The lateral elbow joint was seen without effusion.  Common extensor tendon origin was evaluated in both short and long axis with a mild-mild degree of hypoechoic change about 1 cm proximal to the lateral epicondyle itself, this does suggest a partial thickness tear, although no evidence of high-grade tearing or tendon retraction.  There is also notable hyperemia indicative of superimposed epicondylitis seen both in short and long axis.   Lateral epicondylitis with likely degree of partial tearing of the common extensor tendon origin   *XR 04/09/22 (Shoulder); Left elbow XR 01/28/22: Narrative & Impression  X-rays left elbow were obtained in clinic today.  No acute injuries are noted.  No dislocation.  Joint spaces well-maintained.  No osteophytes.  No bony lesions.   Impression: Negative left elbow x-ray   Narrative & Impression  X-rays of the left shoulder were obtained in clinic today.  No acute injuries noted.  Well-maintained joint space within the glenohumeral joint.  Glenohumeral joint is reduced.  No evidence of proximal humeral migration.  No bony lesions.   Impression: Negative left shoulder x-ray   Past Medical/Family/Surgical/Social History: Medications & Allergies reviewed per EMR, new medications updated. Patient Active Problem List   Diagnosis Date Noted   Genetic testing 07/25/2022   Family history of breast cancer 07/15/2022   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 (HCC) 05/12/2022   Chest tightness 03/04/2022   Family history of coronary  artery disease in mother 03/04/2022   Hyperlipidemia 10/17/2021   Encounter to establish care 10/17/2021   Biceps tendinitis, left 10/17/2021   Mild intermittent asthma without complication 01/14/2021   Seasonal and perennial allergic rhinitis 01/14/2021   Dizziness 04/23/2020   Abnormal auditory perception of both ears 04/23/2020   Heart murmur 03/17/2018   Asthma 03/17/2018   Shortness of breath 09/25/2010   Palpitation    MVP (mitral valve prolapse)    Scoliosis    Past Medical History:  Diagnosis Date   Arthritis    Asthma    Breast cancer (HCC) 04/24/2022   MVP (mitral valve prolapse)    Although no evidence per echo in 2009. Trace MR   Palpitation    Scoliosis    Urticaria    Vaginal Pap smear, abnormal    Family History  Problem Relation Age of Onset   Heart disease Mother    Heart attack Mother    Breast cancer Mother 20   Urticaria Mother    Renal cancer Father 21       metastatic   Breast cancer Maternal Aunt 45   Breast cancer Maternal Aunt 62   Breast cancer  Paternal Grandmother 55   Lung cancer Paternal Grandfather        smoked   Emphysema Paternal Grandfather    Past Surgical History:  Procedure Laterality Date   BREAST BIOPSY Left 04/24/2022   [place] LT BREAST BX W LOC DEV 1ST LESION IMG BX SPEC US GUIDE 04/24/2022 [facility]   BREAST BIOPSY Left 04/24/2022   [place] LT BREAST BX W LOC DEV EA ADD LESION IMG BX SPEC US GUIDE 04/24/2022 [facility]ASOUND   BREAST BIOPSY Left 06/04/2022   [place] LT RADIOACTIVE SEED LOC 06/04/2022 [facility]   BREAST BIOPSY Left 06/04/2022   [place] LT RADIOACTIVE SEED EA ADD LESION 06/04/2022 [facility]MO[facility]AP[facility]   BREAST LUMPECTOMY WITH RADIOACTIVE SEED AND SENTINEL LYMPH NODE BIOPSY Left 06/06/2022   Procedure: LEFT BREAST LUMPECTOMY WITH RAADIOACTIVE SEED AND SENTINEL LYMPH NODE BIOPSY;  Surgeon: Griselda Miner, MD;  Location: [facility];  Service: General;  Laterality: Left;   KIDNEY STONE SURGERY Bilateral 2000    RADIOACTIVE SEED GUIDED AXILLARY SENTINEL LYMPH NODE Left 06/06/2022   Procedure: RADIOACTIVE SEED GUIDED LEFT AXILLARY SENTINEL LYMPH NODE DISSECTION;  Surgeon: Griselda Miner, MD;  Location: [facility];  Service: General;  Laterality: Left;   WISDOM TOOTH EXTRACTION Bilateral 1991   Social History   Occupational History   Not on file  Tobacco Use   Smoking status: Never   Smokeless tobacco: Never  Vaping Use   Vaping status: Never Used  Substance and Sexual Activity   Alcohol use: Yes    Comment: occ   Drug use: No   Sexual activity: Yes    Birth control/protection: None

## 2022-08-19 ENCOUNTER — Ambulatory Visit: Payer: BC Managed Care – PPO

## 2022-08-19 DIAGNOSIS — C50512 Malignant neoplasm of lower-outer quadrant of left female breast: Secondary | ICD-10-CM | POA: Diagnosis not present

## 2022-08-19 DIAGNOSIS — Z8042 Family history of malignant neoplasm of prostate: Secondary | ICD-10-CM | POA: Diagnosis not present

## 2022-08-19 DIAGNOSIS — I341 Nonrheumatic mitral (valve) prolapse: Secondary | ICD-10-CM | POA: Diagnosis not present

## 2022-08-19 DIAGNOSIS — N6002 Solitary cyst of left breast: Secondary | ICD-10-CM | POA: Diagnosis not present

## 2022-08-19 DIAGNOSIS — N6001 Solitary cyst of right breast: Secondary | ICD-10-CM | POA: Diagnosis not present

## 2022-08-19 DIAGNOSIS — Z170 Estrogen receptor positive status [ER+]: Secondary | ICD-10-CM | POA: Diagnosis not present

## 2022-08-19 DIAGNOSIS — C773 Secondary and unspecified malignant neoplasm of axilla and upper limb lymph nodes: Secondary | ICD-10-CM | POA: Diagnosis not present

## 2022-08-19 DIAGNOSIS — Z510 Encounter for antineoplastic radiation therapy: Secondary | ICD-10-CM | POA: Diagnosis not present

## 2022-08-19 DIAGNOSIS — Z803 Family history of malignant neoplasm of breast: Secondary | ICD-10-CM | POA: Diagnosis not present

## 2022-08-20 ENCOUNTER — Ambulatory Visit (INDEPENDENT_AMBULATORY_CARE_PROVIDER_SITE_OTHER): Payer: BC Managed Care – PPO

## 2022-08-20 DIAGNOSIS — Z8042 Family history of malignant neoplasm of prostate: Secondary | ICD-10-CM | POA: Diagnosis not present

## 2022-08-20 DIAGNOSIS — N6002 Solitary cyst of left breast: Secondary | ICD-10-CM | POA: Diagnosis not present

## 2022-08-20 DIAGNOSIS — I341 Nonrheumatic mitral (valve) prolapse: Secondary | ICD-10-CM | POA: Diagnosis not present

## 2022-08-20 DIAGNOSIS — C773 Secondary and unspecified malignant neoplasm of axilla and upper limb lymph nodes: Secondary | ICD-10-CM | POA: Diagnosis not present

## 2022-08-20 DIAGNOSIS — Z803 Family history of malignant neoplasm of breast: Secondary | ICD-10-CM | POA: Diagnosis not present

## 2022-08-20 DIAGNOSIS — N6001 Solitary cyst of right breast: Secondary | ICD-10-CM | POA: Diagnosis not present

## 2022-08-20 DIAGNOSIS — Z170 Estrogen receptor positive status [ER+]: Secondary | ICD-10-CM | POA: Diagnosis not present

## 2022-08-20 DIAGNOSIS — C50512 Malignant neoplasm of lower-outer quadrant of left female breast: Secondary | ICD-10-CM | POA: Diagnosis not present

## 2022-08-20 DIAGNOSIS — J309 Allergic rhinitis, unspecified: Secondary | ICD-10-CM

## 2022-08-20 DIAGNOSIS — Z510 Encounter for antineoplastic radiation therapy: Secondary | ICD-10-CM | POA: Diagnosis not present

## 2022-08-21 ENCOUNTER — Ambulatory Visit: Payer: BC Managed Care – PPO | Attending: General Surgery

## 2022-08-21 DIAGNOSIS — I341 Nonrheumatic mitral (valve) prolapse: Secondary | ICD-10-CM | POA: Diagnosis not present

## 2022-08-21 DIAGNOSIS — Z170 Estrogen receptor positive status [ER+]: Secondary | ICD-10-CM | POA: Diagnosis not present

## 2022-08-21 DIAGNOSIS — C773 Secondary and unspecified malignant neoplasm of axilla and upper limb lymph nodes: Secondary | ICD-10-CM | POA: Diagnosis not present

## 2022-08-21 DIAGNOSIS — R293 Abnormal posture: Secondary | ICD-10-CM | POA: Diagnosis not present

## 2022-08-21 DIAGNOSIS — Z803 Family history of malignant neoplasm of breast: Secondary | ICD-10-CM | POA: Diagnosis not present

## 2022-08-21 DIAGNOSIS — C50512 Malignant neoplasm of lower-outer quadrant of left female breast: Secondary | ICD-10-CM | POA: Diagnosis not present

## 2022-08-21 DIAGNOSIS — M25512 Pain in left shoulder: Secondary | ICD-10-CM | POA: Diagnosis not present

## 2022-08-21 DIAGNOSIS — M25612 Stiffness of left shoulder, not elsewhere classified: Secondary | ICD-10-CM | POA: Insufficient documentation

## 2022-08-21 DIAGNOSIS — Z510 Encounter for antineoplastic radiation therapy: Secondary | ICD-10-CM | POA: Diagnosis not present

## 2022-08-21 DIAGNOSIS — N6002 Solitary cyst of left breast: Secondary | ICD-10-CM | POA: Diagnosis not present

## 2022-08-21 DIAGNOSIS — N6001 Solitary cyst of right breast: Secondary | ICD-10-CM | POA: Diagnosis not present

## 2022-08-21 DIAGNOSIS — Z8042 Family history of malignant neoplasm of prostate: Secondary | ICD-10-CM | POA: Diagnosis not present

## 2022-08-21 NOTE — Therapy (Addendum)
OUTPATIENT PHYSICAL THERAPY BREAST CANCER TREATMENT   Patient Name: Mariah Burnett MRN: [id number] DOB:May 23, 1975, 47 y.o., female Today's Date: 08/21/2022  END OF SESSION:  PT End of Session - 08/21/22 1608     Visit Number 15    Number of Visits 19    Date for PT Re-Evaluation 08/18/22   D/C this visit   PT Start Time 1605    PT Stop Time 1700    PT Time Calculation (min) 55 min    Activity Tolerance Patient tolerated treatment well    Behavior During Therapy [facility] for tasks assessed/performed               Past Medical History:  Diagnosis Date   Arthritis    Asthma    Breast cancer (HCC) 04/24/2022   MVP (mitral valve prolapse)    Although no evidence per echo in 2009. Trace MR   Palpitation    Scoliosis    Urticaria    Vaginal Pap smear, abnormal    Past Surgical History:  Procedure Laterality Date   BREAST BIOPSY Left 04/24/2022   [place] LT BREAST BX W LOC DEV 1ST LESION IMG BX SPEC US GUIDE 04/24/2022 [facility]ASOUND   BREAST BIOPSY Left 04/24/2022   [place] LT BREAST BX W LOC DEV EA ADD LESION IMG BX SPEC US GUIDE 04/24/2022 [facility]-ULTRASOUND   BREAST BIOPSY Left 06/04/2022   [place] LT RADIOACTIVE SEED LOC 06/04/2022 [facility] MAMMOGRAPHY   BREAST BIOPSY Left 06/04/2022   [place] LT RADIOACTIVE SEED EA ADD LESION 06/04/2022 [facility]AMMOGRAPHY   BREAST LUMPECTOMY WITH RADIOACTIVE SEED AND SENTINEL LYMPH NODE BIOPSY Left 06/06/2022   Procedure: LEFT BREAST LUMPECTOMY WITH RAADIOACTIVE SEED AND SENTINEL LYMPH NODE BIOPSY;  Surgeon: Griselda Miner, MD;  Location: [facility];  Service: General;  Laterality: Left;   KIDNEY STONE SURGERY Bilateral 2000   RADIOACTIVE SEED GUIDED AXILLARY SENTINEL LYMPH NODE Left 06/06/2022   Procedure: RADIOACTIVE SEED GUIDED LEFT AXILLARY SENTINEL LYMPH NODE DISSECTION;  Surgeon: Griselda Miner, MD;  Location: [facility];  Service: General;  Laterality: Left;   WISDOM TOOTH EXTRACTION Bilateral 1991   Patient Active Problem List    Diagnosis Date Noted   Genetic testing 07/25/2022   Family history of breast cancer 07/15/2022   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 TOTH  REFERRING DIAG: C50.512 (ICD-10-CM) - Malignant neoplasm of lower-outer quadrant of left female breast Z17.0 (ICD-10-CM) - Estrogen receptor positive status (ER+)   THERAPY DIAG:  Stiffness of left shoulder, not elsewhere classified  Acute pain of left shoulder  Abnormal posture  Malignant neoplasm of lower-outer quadrant of left breast of female, estrogen receptor positive (HCC)  Rationale for Evaluation and Treatment: Rehabilitation  ONSET DATE: 04/24/22  SUBJECTIVE:  SUBJECTIVE STATEMENT: I broke out in blisters so bad after I saw you last. I went online and found the Radioplex that you told me the [facility] pts get and I really think that helped my blisters to heal. I feel so much better now that radiation is over and I think I'm ready to make today my last physical therapy visit. I saw an orthopedist for my Lt elbow pain and he said I have some tears in the tendons and I have lateral epicondylitis so he put me on prednisone.   PERTINENT HISTORY:  Lt lumpectomy due to [facility]  06/06/22. 1/4+ nodes. Radiation planned.   PATIENT GOALS:  Reassess how my recovery is going related to arm function, pain, and swelling.  PAIN:  Are you having pain? No, just light irritation to the Lt breast  PRECAUTIONS: Recent Surgery, left UE Lymphedema risk  ACTIVITY LEVEL / LEISURE: Not back to normal activities: I haven't gotten back to walking, and other exercises. Not back to work yet 06/30/22.     OBJECTIVE:   PATIENT SURVEYS:  QUICK DASH: 45% from 59%   OBSERVATIONS: Still wearing compression.  Breast incisions look well healed with glue still present.  Slight general edema to the breast.   POSTURE:  Guarded   LYMPHEDEMA ASSESSMENT:   UPPER EXTREMITY AROM/PROM:   A/PROM RIGHT   eval 166  Shoulder extension 66  Shoulder flexion 166  Shoulder abduction 176  Shoulder internal rotation 59  Shoulder external rotation 95                          (Blank rows = not tested)   A/PROM LEFT   eval 06/20/22 06/30/22 08/21/22  Shoulder extension 41 40 35   Shoulder flexion 118 135 - pull in breast and arm 155 154  Shoulder abduction 149 150 164 173  Shoulder internal rotation Unable due to P!   45 p!  Shoulder external rotation Unable due to P! 25 pn  79 p!                          (Blank rows = not tested)   UPPER EXTREMITY STRENGTH: did not assess due to pain   LYMPHEDEMA ASSESSMENTS:    LANDMARK RIGHT   eval  10 cm proximal to olecranon process 27.2  Olecranon process 24.1  10 cm proximal to ulnar styloid process 20.7  Just proximal to ulnar styloid process 15  Across hand at thumb web space 18.5  At base of 2nd digit 5.5  (Blank rows = not tested)   LANDMARK LEFT   eval  10 cm proximal to olecranon process 27  Olecranon process 25  10 cm proximal to ulnar styloid process 20.2  Just proximal to ulnar styloid process 15  Across hand at thumb web space 18.2  At base of 2nd digit 5.5  (Blank rows = not tested)  TODAY'S TREATMENT: Pt permission and  consent throughout each step of examination and treatment with modification and draping if requested when working on sensitive areas 08/21/22: Manual Therapy Lt breast MLD with review of correct pressure and skin stretch: Short neck, 5 diaphragmatic breaths, Lt inguinal and Rt axillary nodes, Lt axillo-inguinal and anterior inter-axillary anastomosis then focused on Lt breast redirecting today. Applied cocoa butter gently to Lt breast after MLD due to increased dryness and irritation from last radiation today.  P/ROM of Lt shoulder into flexion, abd and  D2 with scapular depression by therapist throughout P/ROM Re measured A/ROM of Lt shoulder.  08/04/22: Manual Therapy Lt breast MLD with review of correct pressure and skin stretch: Short neck, 5 diaphragmatic breaths, Lt inguinal and bil axillary nodes, Lt axillo-inguinal and anterior inter-axillary anastomosis then focused on Lt breast with hand over hand assistance for the breast portion.  Included the left arm but did not educate patient on this part.  P/ROM of Lt shoulder into flexion and abd and D2 with scapular depression by therapist throughout P/ROM MFR to medial upper arm and at forearm where palpable tightness but very mild cording  07/29/22: Manual Therapy Lt breast MLD with education and handout given after education: Short neck, 5 diaphragmatic breaths, Lt inguinal and bil axillary nodes, Lt axillo-inguinal and anterior inter-axillary anastomosis then focused on Lt breast with hand over hand assistance for the breast portion.  Included the left arm but did not educate patient on this part.  P/ROM of Lt shoulder into flexion and abd  Cording work left medial upper arm    PATIENT EDUCATION:  Education details: tennis ball on wall for self massage Person educated: Patient Education method: Medical illustrator Education comprehension: verbalized understanding  HOME EXERCISE PROGRAM: Reviewed previously given post op HEP  which includes frozen shoulder stretches of: supine flexion, chest stretch, sleeper stretch, and wall walking Tennis ball on wall for self massage  ASSESSMENT:  CLINICAL IMPRESSION:  Pt has done excellent with this episode of physical therapy. And despite just completing radiation her A/ROM of Lt shoulder has improved showing her frozen shoulder is thawing. Pt is very pleased with her current progress and ready to D/C at this time. Encouraged her to cont using the radioplex and let us know if she has any issues in the future and her doctor can send a referral prn. Pt verbalized understanding and is agreeable to D/C.   Pt will benefit from skilled therapeutic intervention to improve on the following deficits: Decreased knowledge of precautions, impaired UE functional use, pain, decreased ROM, postural dysfunction.   PT treatment/interventions: ADL/Self care home management, Therapeutic exercises, Patient/Family education, Self Care, Joint mobilization, DME instructions, Manual therapy, and Re-evaluation   GOALS: Goals reviewed with patient? Yes  LONG TERM GOALS:  (STG=LTG)  GOALS Name Target Date  Goal status  1 Pt will demonstrate she has regained full shoulder ROM and function post operatively compared to baselines.  Baseline: 06/20/22 - met but will aim for improvements for radiation MET  2 Pt will improve Left shoulder ROM to obtain radiation position with comfort  MET  3 Pt will be ind with final HEP for shoulder mobility   MET  4        PLAN:  PT FREQUENCY/DURATION: 1-2x per week for 4 weeks  PLAN FOR NEXT SESSION: D/C this visit. Cont every 3 month L-Dex screens.  PHYSICAL THERAPY DISCHARGE SUMMARY  Visits from Start of Care: 15  Current functional level related to goals / functional outcomes: Achieved goals establish   Remaining deficits: Shoulder pain and ROM greatly improved but continues with pain with AROM IR and ER   Education / Equipment: MLD, HEP, theraband    Patient agrees to discharge. Patient goals were met. Patient is being discharged due to being pleased with the current functional level.   [facility]  [address], [address]  [place] [address]  [phone]  Alvira Monday, PT 08/21/22 8:29 PM   Hermenia Bers, PTA 08/21/2022, 5:26  PM

## 2022-08-27 DIAGNOSIS — M25632 Stiffness of left wrist, not elsewhere classified: Secondary | ICD-10-CM | POA: Diagnosis not present

## 2022-08-27 DIAGNOSIS — M25532 Pain in left wrist: Secondary | ICD-10-CM | POA: Diagnosis not present

## 2022-08-27 DIAGNOSIS — M7712 Lateral epicondylitis, left elbow: Secondary | ICD-10-CM | POA: Diagnosis not present

## 2022-08-27 DIAGNOSIS — M25522 Pain in left elbow: Secondary | ICD-10-CM | POA: Diagnosis not present

## 2022-09-01 ENCOUNTER — Encounter: Payer: Self-pay | Admitting: Sports Medicine

## 2022-09-01 ENCOUNTER — Ambulatory Visit: Payer: BC Managed Care – PPO | Admitting: Sports Medicine

## 2022-09-01 DIAGNOSIS — M7502 Adhesive capsulitis of left shoulder: Secondary | ICD-10-CM

## 2022-09-01 DIAGNOSIS — M7712 Lateral epicondylitis, left elbow: Secondary | ICD-10-CM | POA: Diagnosis not present

## 2022-09-01 DIAGNOSIS — S56512A Strain of other extensor muscle, fascia and tendon at forearm level, left arm, initial encounter: Secondary | ICD-10-CM | POA: Diagnosis not present

## 2022-09-01 NOTE — Progress Notes (Signed)
Mariah Burnett - 47 y.o. female MRN [id number]  Date of birth: 1975/02/16  Office Visit Note: Visit Date: 09/01/2022 PCP: Billie Lade, MD Referred by: Billie Lade, MD  Subjective: Chief Complaint  Patient presents with   Left Elbow - Pain   HPI: Mariah Burnett is a pleasant 47 y.o. female who presents today for chronic lateral epicondylitis, left. Left adhesive capsulitis.  Pain has bothered her on and off for over a year.  Started after working out in [employer] and noting a pulling sensation. She did have a lateral epicondyle corticosteroid injection by Dr. Dallas Schimke on 01/28/2022.   Today, Mariah Burnett states she did have fairly good improvement of the elbow pain after the Medrol Dosepak, but feels like the effects are now starting to wear off.  She did have her first evaluation of physical therapy.  She is icing at the end of the day and wearing her counterforce strap during the day with activity.  Left shoulder -still continues to be markedly improved.  She has some restriction with sleeper stretch and external rotation.  She did complete her formalized physical therapy for this but is continuing her home exercises.  She does have meloxicam 15 mg once daily written by her primary physician.  Pertinent ROS were reviewed with the patient and found to be negative unless otherwise specified above in HPI.   Assessment & Plan: Visit Diagnoses:  1. Lateral epicondylitis of left elbow   2. Partial tear of common extensor tendon of left elbow   3. Adhesive capsulitis of left shoulder    Plan: Discussed with Mariah Burnett treatment options for her lateral epicondylitis with likely concomitant partial tearing of the common extensor tendon.  Through shared decision-making, did elect to proceed with extracorporeal shockwave treatment.  We will plan for 2-3 of these and then reevaluate her cumulative benefit for discussion on further treatments.  She did get some relief from the prednisone pack, she will  discontinue this.  I also would like her to hold on scheduled meloxicam 15 mg at this time as we are doing shockwave treatment.  This is something we could consider down the line if she is not getting improvement.  She will continue both her formalized physical therapy and her home therapy for the elbow as well as range of motion for her adhesive capsulitis. Continue counterforce strap and ice. She will follow-up next week for repeat shockwave and further evaluation.  Follow-up: Return in about 1 week (around 09/08/2022) for for Left elbow (LE) - reg visit.   Meds & Orders: No orders of the defined types were placed in this encounter.  No orders of the defined types were placed in this encounter.    Procedures: Procedure: ECSWT Indications:  Left elbow lateral epicondylitis   Procedure Details Consent: Risks of procedure as well as the alternatives and risks of each were explained to the patient.  Verbal consent for procedure obtained. Time Out: Verified patient identification, verified procedure, site was marked, verified correct patient position. The area was cleaned with alcohol swab.     The left lateral epicondyle and common extensor tendon was targeted for Extracorporeal shockwave therapy.    Preset: Lateral epicondylitis Power Level: 110 mJ Frequency: 12 Hz Impulse/cycles: 2800 Head size: Regular   Patient tolerated procedure well without immediate complications.       Clinical History: No specialty comments available.  She reports that she has never smoked. She has never used smokeless tobacco. No results for input(s): "HGBA1C", "  LABURIC" in the last 8760 hours.  Objective:   Vital Signs: There were no vitals taken for this visit.  Physical Exam  Gen: Well-appearing, in no acute distress; non-toxic CV: Well-perfused. Warm.  Resp: Breathing unlabored on room air; no wheezing. Psych: Fluid speech in conversation; appropriate affect; normal thought process Neuro: Sensation  intact throughout. No gross coordination deficits.   Ortho Exam - Left elbow: Positive TTP over the lateral epicondyle and just distal to this over the common extensor tendon origin.  There is full range of motion in flexion extension about the elbow.  No overlying soft tissue swelling or effusion.  - Left shoulder: No bony TTP.  There is full active and passive flexion and abduction.  External rotation is still limited at about 45 degrees compared to 65 degrees of the contralateral arm.  Thumb to T12 compared to T10 of the contralateral arm.  Imaging: No results found.  Past Medical/Family/Surgical/Social History: Medications & Allergies reviewed per EMR, new medications updated. Patient Active Problem List   Diagnosis Date Noted   Genetic testing 07/25/2022   Family history of breast cancer 07/15/2022   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 (HCC) 05/12/2022   Chest tightness 03/04/2022   Family history of coronary artery disease in mother 03/04/2022   Hyperlipidemia 10/17/2021   Encounter to establish care 10/17/2021   Biceps tendinitis, left 10/17/2021   Mild intermittent asthma without complication 01/14/2021   Seasonal and perennial allergic rhinitis 01/14/2021   Dizziness 04/23/2020   Abnormal auditory perception of both ears 04/23/2020   Heart murmur 03/17/2018   Asthma 03/17/2018   Shortness of breath 09/25/2010   Palpitation    MVP (mitral valve prolapse)    Scoliosis    Past Medical History:  Diagnosis Date   Arthritis    Asthma    Breast cancer (HCC) 04/24/2022   MVP (mitral valve prolapse)    Although no evidence per echo in 2009. Trace MR   Palpitation    Scoliosis    Urticaria    Vaginal Pap smear, abnormal    Family History  Problem Relation Age of Onset   Heart disease Mother    Heart attack Mother    Breast cancer Mother 24   Urticaria Mother    Renal cancer Father 7       metastatic   Breast cancer Maternal Aunt 74    Breast cancer Maternal Aunt 67   Breast cancer Paternal Grandmother 35   Lung cancer Paternal Grandfather        smoked   Emphysema Paternal Grandfather    Past Surgical History:  Procedure Laterality Date   BREAST BIOPSY Left 04/24/2022   [place] LT BREAST BX W LOC DEV 1ST LESION IMG BX SPEC US GUIDE 04/24/2022 [facility]-ULTRASOUND   BREAST BIOPSY Left 04/24/2022   [place] LT BREAST BX W LOC DEV EA ADD LESION IMG BX SPEC US GUIDE 04/24/2022 [facility]-ULTRASOUND   BREAST BIOPSY Left 06/04/2022   [place] LT RADIOACTIVE SEED LOC 06/04/2022 [facility] MAMMOGRAPHY   BREAST BIOPSY Left 06/04/2022   [place] LT RADIOACTIVE SEED EA ADD LESION 06/04/2022 [facility] MAMMOGRAPHY   BREAST LUMPECTOMY WITH RADIOACTIVE SEED AND SENTINEL LYMPH NODE BIOPSY Left 06/06/2022   Procedure: LEFT BREAST LUMPECTOMY WITH RAADIOACTIVE SEED AND SENTINEL LYMPH NODE BIOPSY;  Surgeon: Griselda Miner, MD;  Location: [facility];  Service: General;  Laterality: Left;   KIDNEY STONE SURGERY Bilateral 2000   RADIOACTIVE SEED GUIDED AXILLARY SENTINEL LYMPH NODE Left 06/06/2022  Procedure: RADIOACTIVE SEED GUIDED LEFT AXILLARY SENTINEL LYMPH NODE DISSECTION;  Surgeon: Griselda Miner, MD;  Location: [facility];  Service: General;  Laterality: Left;   WISDOM TOOTH EXTRACTION Bilateral 1991   Social History   Occupational History   Not on file  Tobacco Use   Smoking status: Never   Smokeless tobacco: Never  Vaping Use   Vaping status: Never Used  Substance and Sexual Activity   Alcohol use: Yes    Comment: occ   Drug use: No   Sexual activity: Yes    Birth control/protection: None

## 2022-09-02 DIAGNOSIS — M25632 Stiffness of left wrist, not elsewhere classified: Secondary | ICD-10-CM | POA: Diagnosis not present

## 2022-09-02 DIAGNOSIS — M7712 Lateral epicondylitis, left elbow: Secondary | ICD-10-CM | POA: Diagnosis not present

## 2022-09-02 DIAGNOSIS — M25532 Pain in left wrist: Secondary | ICD-10-CM | POA: Diagnosis not present

## 2022-09-02 DIAGNOSIS — M25522 Pain in left elbow: Secondary | ICD-10-CM | POA: Diagnosis not present

## 2022-09-04 ENCOUNTER — Inpatient Hospital Stay: Payer: BC Managed Care – PPO | Attending: Hematology and Oncology | Admitting: Hematology and Oncology

## 2022-09-04 ENCOUNTER — Inpatient Hospital Stay: Payer: BC Managed Care – PPO

## 2022-09-04 VITALS — BP 111/74 | HR 92 | Temp 97.5°F | Resp 18 | Ht 67.0 in | Wt 164.0 lb

## 2022-09-04 DIAGNOSIS — Z170 Estrogen receptor positive status [ER+]: Secondary | ICD-10-CM | POA: Diagnosis not present

## 2022-09-04 DIAGNOSIS — C50512 Malignant neoplasm of lower-outer quadrant of left female breast: Secondary | ICD-10-CM | POA: Diagnosis not present

## 2022-09-04 DIAGNOSIS — Z5111 Encounter for antineoplastic chemotherapy: Secondary | ICD-10-CM | POA: Insufficient documentation

## 2022-09-04 MED ORDER — GOSERELIN ACETATE 3.6 MG ~~LOC~~ IMPL
3.6000 mg | DRUG_IMPLANT | Freq: Once | SUBCUTANEOUS | Status: AC
  Administered 2022-09-04: 3.6 mg via SUBCUTANEOUS
  Filled 2022-09-04: qty 3.6

## 2022-09-04 MED ORDER — ANASTROZOLE 1 MG PO TABS: 1.0000 mg | ORAL_TABLET | Freq: Every day | ORAL | 3 refills | Status: DC

## 2022-09-04 NOTE — Progress Notes (Signed)
Patient Care Team: Billie Lade, MD as PCP - General (Internal Medicine) Pershing Proud, RN as Oncology Nurse Navigator Donnelly Angelica, RN as Oncology Nurse Navigator Serena Croissant, MD as Consulting Physician (Hematology and Oncology)  DIAGNOSIS:  Encounter Diagnosis  Name Primary?   Malignant neoplasm of lower-outer quadrant of left breast of female, estrogen receptor positive (HCC) Yes    SUMMARY OF ONCOLOGIC HISTORY: Oncology History  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 (HCC)  04/24/2022 Initial Diagnosis   Left breast mass at 4 o'clock position 1.8 cm, 1 abnormal lymph node (positive), MRI revealed 2.3 cm mass: Biopsy: Grade 2 IDC with papillary features ER 80%, PR 100%, Ki-67 10%, HER2 0 negative   05/12/2022 Cancer Staging   Staging form: Breast, AJCC 8th Edition - Clinical stage from 05/12/2022: Stage IIA (cT2, cN1(f), cM0, G2, ER+, PR+, HER2-) - Signed by Ronny Bacon, PA-C on 05/14/2022 Stage prefix: Initial diagnosis Method of lymph node assessment: Core biopsy Histologic grading system: 3 grade system   06/06/2022 Surgery   Left breast lumpectomy: IDC, 1.6 cm, grade 2, margins negative, 1/4 LN + carcinoma.    06/06/2022 Oncotype testing   3/2%   06/06/2022 Cancer Staging   Staging form: Breast, AJCC 8th Edition - Pathologic stage from 06/06/2022: Stage IA (pT1c, pN1a, cM0, G2, ER+, PR+, HER2-, Oncotype DX score: 3) - Signed by Loa Socks, NP on 06/19/2022 Stage prefix: Initial diagnosis Multigene prognostic tests performed: Oncotype DX Recurrence score range: Less than 11 Histologic grading system: 3 grade system    Genetic Testing   [facility] CancerNext+RenalNext Panel+RNA was Negative. Report date is 07/23/2022.  The CancerNext+RenalNext gene panel offered by [employer] includes sequencing, rearrangement analysis, and RNA analysis for the following 46 genes:   APC, ATM, AXIN2, BAP1, BARD1, BMPR1A, BRCA1, BRCA2,  BRIP1, CDH1, CDK4, CDKN2A, CHEK2, DICER1, FH, FLCN, HOXB13, EPCAM, GREM1, MET, MITF, MLH1, MSH2, MSH3, MSH6, MUTYH, NF1, NTHL1, PALB2, PMS2, POLD1, POLE, PTEN, RAD51C, RAD51D, SDHA, SDHB, SDHC, SDHD, SMAD4, SMARCA4, STK11, TP53, TSC1, TSC2, and VHL.     07/23/2022 - 08/21/2022 Radiation Therapy   Adj XRT at [facility]     CHIEF COMPLIANT: Follow-up after radiation  INTERVAL HISTORY: Mariah Burnett is a 47 year old with above-mentioned history of breast cancer who completed radiation therapy and is here to discuss antiestrogen therapy.  She is going to start with Zoladex injections and anastrozole therapy.  She is anxious to get started but is worried about adverse effects.   ALLERGIES:  is allergic to latex.  MEDICATIONS:  Current Outpatient Medications  Medication Sig Dispense Refill   anastrozole (ARIMIDEX) 1 MG tablet Take 1 tablet (1 mg total) by mouth daily. 90 tablet 3   acyclovir (ZOVIRAX) 200 MG capsule Take 400 mg by mouth 3 (three) times daily.     desloratadine (CLARINEX) 5 MG tablet Take one tablet once daily as needed for runny nose or itchy eyes. 90 tablet 1   hydrOXYzine (ATARAX) 25 MG tablet Take 25 mg by mouth every 8 (eight) hours as needed.     meloxicam (MOBIC) 15 MG tablet TAKE 1 TABLET DAILY AS     NEEDED 30 tablet 0   metoprolol tartrate (LOPRESSOR) 25 MG tablet TAKE 1/2 TABLET TWICE A DAY 90 tablet 3   No current facility-administered medications for this visit.    PHYSICAL EXAMINATION: ECOG PERFORMANCE STATUS: 1 - Symptomatic but completely ambulatory  Vitals:   09/04/22 1122  BP: 111/74  Pulse: 92  Resp: 18  Temp: (!) 97.5 F (36.4 C)  SpO2: 100%   Filed Weights   09/04/22 1122  Weight: 164 lb (74.4 kg)      LABORATORY DATA:  I have reviewed the data as listed    Latest Ref Rng & Units 04/05/2019   12:47 PM 06/26/2017   10:00 AM 08/20/2015    7:09 PM  CMP  Glucose 70 - 99 mg/dL 96  97  94   BUN 6 - 20 mg/dL 13  12  10    Creatinine 0.44 - 1.00 mg/dL  7.84  6.96  2.95   Sodium 135 - 145 mmol/L 136  139  136   Potassium 3.5 - 5.1 mmol/L 3.4  4.0  3.8   Chloride 98 - 111 mmol/L 103  107  102   CO2 22 - 32 mmol/L 24  26  27    Calcium 8.9 - 10.3 mg/dL 9.3  9.2  9.6   Total Protein 6.5 - 8.1 g/dL   7.9   Total Bilirubin 0.3 - 1.2 mg/dL   1.3   Alkaline Phos 38 - 126 U/L   37   AST 15 - 41 U/L   20   ALT 14 - 54 U/L   13     Lab Results  Component Value Date   WBC 5.4 04/05/2019   HGB 13.6 04/05/2019   HCT 40.2 04/05/2019   MCV 95.3 04/05/2019   PLT 202 04/05/2019   NEUTROABS 3.5 06/26/2017    ASSESSMENT & PLAN:  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 (HCC) 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  06/06/2022:Left breast lumpectomy: IDC, 1.6 cm, grade 2, margins negative, 1/4 LN + carcinoma  06/06/2022: Oncotype: Recurrence score 3 (risk of distant recurrence 2%) Patient decided not to dissipate in OFFSET clinical trial or chemo with TC and decided on OFS with AI   Adjuvant XRT to be done in [place]   Treatment Plan: Adjuvant anti-estrogen therapy with Zoladex with anastrozole Return to clinic monthly for injections Survivorship care plan visit in 3 months. Discussed Signatera testing.  She will think about it and will inform us.   No orders of the defined types were placed in this encounter.  The patient has a good understanding of the overall plan. she agrees with it. she will call with any problems that may develop before the next visit here. Total time spent: 30 mins including face to face time and time spent for planning, charting and co-ordination of care   Tamsen Meek, MD 09/04/22

## 2022-09-04 NOTE — Assessment & Plan Note (Addendum)
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  06/06/2022:Left breast lumpectomy: IDC, 1.6 cm, grade 2, margins negative, 1/4 LN + carcinoma  06/06/2022: Oncotype: Recurrence score 3 (risk of distant recurrence 2%) Patient decided not to dissipate in OFFSET clinical trial or chemo with TC and decided on OFS with AI   Adjuvant XRT to be done in [place]   Treatment Plan: Adjuvant anti-estrogen therapy with Zoladex with anastrozole Return to clinic monthly for injections Survivorship care plan visit in 3 months. Discussed Signatera testing.  She will think about it and will inform us.

## 2022-09-04 NOTE — Patient Instructions (Signed)
Goserelin Implant What is this medication? GOSERELIN (GOE se rel in) treats prostate cancer and breast cancer. It works by decreasing levels of the hormones testosterone and estrogen in the body. This prevents prostate and breast cancer cells from spreading or growing. It may also be used to treat endometriosis. This is a condition where the tissue that lines the uterus grows outside the uterus. It works by decreasing the amount of estrogen your body makes, which reduces heavy bleeding and pain. It can also be used to help thin the lining of the uterus before a surgery used to prevent or reduce heavy periods. This medicine may be used for other purposes; ask your health care provider or pharmacist if you have questions. COMMON BRAND NAME(S): Zoladex, Zoladex 3-Month What should I tell my care team before I take this medication? They need to know if you have any of these conditions: Bone problems Diabetes Heart disease History of irregular heartbeat or rhythm An unusual or allergic reaction to goserelin, other medications, foods, dyes, or preservatives Pregnant or trying to get pregnant Breastfeeding How should I use this medication? This medication is injected under the skin. It is given by your care team in a hospital or clinic setting. Talk to your care team about the use of this medication in children. Special care may be needed. Overdosage: If you think you have taken too much of this medicine contact a poison control center or emergency room at once. NOTE: This medicine is only for you. Do not share this medicine with others. What if I miss a dose? Keep appointments for follow-up doses. It is important not to miss your dose. Call your care team if you are unable to keep an appointment. What may interact with this medication? Do not take this medication with any of the following: Cisapride Dronedarone Pimozide Thioridazine This medication may also interact with the following: Other  medications that cause heart rhythm changes This list may not describe all possible interactions. Give your health care provider a list of all the medicines, herbs, non-prescription drugs, or dietary supplements you use. Also tell them if you smoke, drink alcohol, or use illegal drugs. Some items may interact with your medicine. What should I watch for while using this medication? Visit your care team for regular checks on your progress. Your symptoms may appear to get worse during the first weeks of this therapy. Tell your care team if your symptoms do not start to get better or if they get worse after this time. Using this medication for a long time may weaken your bones. If you smoke or frequently drink alcohol you may increase your risk of bone loss. A family history of osteoporosis, chronic use of medications for seizures (convulsions), or corticosteroids can also increase your risk of bone loss. The risk of bone fractures may be increased. Talk to your care team about your bone health. This medication may increase blood sugar. The risk may be higher in patients who already have diabetes. Ask your care team what you can do to lower your risk of diabetes while taking this medication. This medication should stop regular monthly menstruation in women. Tell your care team if you continue to menstruate. Talk to your care team if you wish to become pregnant or think you might be pregnant. This medication can cause serious birth defects if taken during pregnancy or for 12 weeks after stopping treatment. Talk to your care team about reliable forms of contraception. Do not breastfeed while taking this   medication. This medication may cause infertility. Talk to your care team if you are concerned about your fertility. What side effects may I notice from receiving this medication? Side effects that you should report to your care team as soon as possible: Allergic reactions--skin rash, itching, hives, swelling  of the face, lips, tongue, or throat Change in the amount of urine Heart attack--pain or tightness in the chest, shoulders, arms, or jaw, nausea, shortness of breath, cold or clammy skin, feeling faint or lightheaded Heart rhythm changes--fast or irregular heartbeat, dizziness, feeling faint or lightheaded, chest pain, trouble breathing High blood sugar (hyperglycemia)--increased thirst or amount of urine, unusual weakness or fatigue, blurry vision High calcium level--increased thirst or amount of urine, nausea, vomiting, confusion, unusual weakness or fatigue, bone pain Pain, redness, irritation, or bruising at the injection site Severe back pain, numbness or weakness of the hands, arms, legs, or feet, loss of coordination, loss of bowel or bladder control Stroke--sudden numbness or weakness of the face, arm, or leg, trouble speaking, confusion, trouble walking, loss of balance or coordination, dizziness, severe headache, change in vision Swelling and pain of the tumor site or lymph nodes Trouble passing urine Side effects that usually do not require medical attention (report to your care team if they continue or are bothersome): Change in sex drive or performance Headache Hot flashes Rapid or extreme change in emotion or mood Sweating Swelling of the ankles, hands, or feet Unusual vaginal discharge, itching, or odor This list may not describe all possible side effects. Call your doctor for medical advice about side effects. You may report side effects to FDA at 1-800-FDA-1088. Where should I keep my medication? This medication is given in a hospital or clinic. It will not be stored at home. NOTE: This sheet is a summary. It may not cover all possible information. If you have questions about this medicine, talk to your doctor, pharmacist, or health care provider.  2024 Elsevier/Gold Standard (2021-05-16 00:00:00)  

## 2022-09-05 DIAGNOSIS — M25522 Pain in left elbow: Secondary | ICD-10-CM | POA: Diagnosis not present

## 2022-09-05 DIAGNOSIS — M7712 Lateral epicondylitis, left elbow: Secondary | ICD-10-CM | POA: Diagnosis not present

## 2022-09-05 DIAGNOSIS — M25532 Pain in left wrist: Secondary | ICD-10-CM | POA: Diagnosis not present

## 2022-09-05 DIAGNOSIS — M25632 Stiffness of left wrist, not elsewhere classified: Secondary | ICD-10-CM | POA: Diagnosis not present

## 2022-09-09 ENCOUNTER — Encounter: Payer: Self-pay | Admitting: Sports Medicine

## 2022-09-09 ENCOUNTER — Ambulatory Visit (INDEPENDENT_AMBULATORY_CARE_PROVIDER_SITE_OTHER): Payer: BC Managed Care – PPO | Admitting: Sports Medicine

## 2022-09-09 DIAGNOSIS — S56512A Strain of other extensor muscle, fascia and tendon at forearm level, left arm, initial encounter: Secondary | ICD-10-CM | POA: Diagnosis not present

## 2022-09-09 DIAGNOSIS — M7712 Lateral epicondylitis, left elbow: Secondary | ICD-10-CM

## 2022-09-09 DIAGNOSIS — M25522 Pain in left elbow: Secondary | ICD-10-CM | POA: Diagnosis not present

## 2022-09-09 DIAGNOSIS — M25512 Pain in left shoulder: Secondary | ICD-10-CM | POA: Diagnosis not present

## 2022-09-09 DIAGNOSIS — M25632 Stiffness of left wrist, not elsewhere classified: Secondary | ICD-10-CM | POA: Diagnosis not present

## 2022-09-09 DIAGNOSIS — M25532 Pain in left wrist: Secondary | ICD-10-CM | POA: Diagnosis not present

## 2022-09-09 NOTE — Progress Notes (Signed)
Mariah Burnett - 47 y.o. female MRN [id number]  Date of birth: [birth year]  Office Visit Note: Visit Date: 09/09/2022 PCP: Billie Lade, MD Referred by: Billie Lade, MD  Subjective: Chief Complaint  Patient presents with   Arm Pain    Patient states that shockwave therapy has helped already with elbow pain. She says that she has been doing PT twice a week which also seems to be helping. Patient states that she is having some shoulder pain and aching.   HPI: Mariah Burnett is a pleasant 47 y.o. female who presents today for left elbow lateral epicondylitis.  She did have a lateral epicondyle corticosteroid injection by Dr. Dallas Schimke on 01/28/2022.  We did perform first session of extracorporeal shockwave therapy on 09/01/2022, did notice a positive response from this -had a day or 2 of soreness for the study day 3 noticed good improvement and this has continued as the week went on. No longer taking Meloxicam at this time. Doing both formal PT and HEP.   Having some mild pain over the posterior aspect of the left shoulder.  Denies any restriction in motion.  She did start a new rehab exercise and therapy with posterior shoulder and arm extension.  Pertinent ROS were reviewed with the patient and found to be negative unless otherwise specified above in HPI.   Assessment & Plan: Visit Diagnoses:  1. Lateral epicondylitis of left elbow   2. Partial tear of common extensor tendon of left elbow   3. Acute pain of left shoulder    Plan: Marcell a positive response to the first extracorporeal shockwave therapy for her lateral epicondylitis and superimposed partial tear of the common extensor tendon.  She favors doing formalized physical therapy twice weekly and performing HEP, I would like her to continue both.  She had some shoulder soreness/pain but I think this is more so from some of the new PT exercises she was doing and not a reexacerbation of her frozen shoulder.  She will work with her PT  therapist, advised ice.  She will hold off for the meloxicam at this time but may take this for a few days if it is not settle down over the weekend.  She will follow-up next week for repeat shockwave and further evaluation.  Follow-up: Return in about 1 week (around 09/16/2022) for for left elbow .   Meds & Orders: No orders of the defined types were placed in this encounter.  No orders of the defined types were placed in this encounter.    Procedures: Procedure: ECSWT Indications:  Left elbow lateral epicondylitis   Procedure Details Consent: Risks of procedure as well as the alternatives and risks of each were explained to the patient.  Verbal consent for procedure obtained. Time Out: Verified patient identification, verified procedure, site was marked, verified correct patient position. The area was cleaned with alcohol swab.     The left lateral epicondyle and common extensor tendon was targeted for Extracorporeal shockwave therapy.    Preset: Lateral epicondylitis Power Level: 110 mJ Frequency: 12 Hz Impulse/cycles: 2700 Head size: Regular     Patient tolerated procedure well without immediate complications.      Clinical History: No specialty comments available.  She reports that she has never smoked. She has never used smokeless tobacco. No results for input(s): "HGBA1C", "LABURIC" in the last 8760 hours.  Objective:    Physical Exam  Gen: Well-appearing, in no acute distress; non-toxic CV: Well-perfused. Warm.  Resp: Breathing unlabored  on room air; no wheezing. Psych: Fluid speech in conversation; appropriate affect; normal thought process Neuro: Sensation intact throughout. No gross coordination deficits.   Ortho Exam - Left elbow: + Mild TTP over the lateral epicondyle but improved from previous visit.  There is full range of motion with elbow flexion and extension.  No overlying soft tissue swelling or elbow effusion.  - Left shoulder: No bony TTP.  There is  full active and passive range of motion in all directions.  Negative drop arm, negative empty can sign.  There is an irritation with resisted ER but full range of motion with internal to T11 and only about 5 degrees less of external rotation both active and passively.  Imaging: No results found.  Past Medical/Family/Surgical/Social History: Medications & Allergies reviewed per EMR, new medications updated. Patient Active Problem List   Diagnosis Date Noted   Genetic testing 07/25/2022   Family history of breast cancer 07/15/2022   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 (HCC) 05/12/2022   Chest tightness 03/04/2022   Family history of coronary artery disease in mother 03/04/2022   Hyperlipidemia 10/17/2021   Encounter to establish care 10/17/2021   Biceps tendinitis, left 10/17/2021   Mild intermittent asthma without complication 01/14/2021   Seasonal and perennial allergic rhinitis 01/14/2021   Dizziness 04/23/2020   Abnormal auditory perception of both ears 04/23/2020   Heart murmur 03/17/2018   Asthma 03/17/2018   Shortness of breath 09/25/2010   Palpitation    MVP (mitral valve prolapse)    Scoliosis    Past Medical History:  Diagnosis Date   Arthritis    Asthma    Breast cancer (HCC) 04/24/2022   MVP (mitral valve prolapse)    Although no evidence per echo in 2009. Trace MR   Palpitation    Scoliosis    Urticaria    Vaginal Pap smear, abnormal    Family History  Problem Relation Age of Onset   Heart disease Mother    Heart attack Mother    Breast cancer Mother 52   Urticaria Mother    Renal cancer Father 25       metastatic   Breast cancer Maternal Aunt 59   Breast cancer Maternal Aunt 71   Breast cancer Paternal Grandmother 89   Lung cancer Paternal Grandfather        smoked   Emphysema Paternal Grandfather    Past Surgical History:  Procedure Laterality Date   BREAST BIOPSY Left 04/24/2022   [place] LT BREAST BX W LOC DEV 1ST LESION IMG  BX SPEC US GUIDE 04/24/2022 [facility]   BREAST BIOPSY Left 04/24/2022   [place] LT BREAST BX W LOC DEV EA ADD LESION IMG BX SPEC US GUIDE 04/24/2022 [facility]   BREAST BIOPSY Left 06/04/2022   [place] LT RADIOACTIVE SEED LOC 06/04/2022 [facility]   BREAST BIOPSY Left 06/04/2022   [place] LT RADIOACTIVE SEED EA ADD LESION 06/04/2022 [facility]   BREAST LUMPECTOMY WITH RADIOACTIVE SEED AND SENTINEL LYMPH NODE BIOPSY Left 06/06/2022   Procedure: LEFT BREAST LUMPECTOMY WITH RAADIOACTIVE SEED AND SENTINEL LYMPH NODE BIOPSY;  Surgeon: Griselda Miner, MD;  Location: [facility];  Service: General;  Laterality: Left;   KIDNEY STONE SURGERY Bilateral 2000   RADIOACTIVE SEED GUIDED AXILLARY SENTINEL LYMPH NODE Left 06/06/2022   Procedure: RADIOACTIVE SEED GUIDED LEFT AXILLARY SENTINEL LYMPH NODE DISSECTION;  Surgeon: Griselda Miner, MD;  Location: [facility];  Service: General;  Laterality: Left;  WISDOM TOOTH EXTRACTION Bilateral 1991   Social History   Occupational History   Not on file  Tobacco Use   Smoking status: Never   Smokeless tobacco: Never  Vaping Use   Vaping status: Never Used  Substance and Sexual Activity   Alcohol use: Yes    Comment: occ   Drug use: No   Sexual activity: Yes    Birth control/protection: None

## 2022-09-12 DIAGNOSIS — M25522 Pain in left elbow: Secondary | ICD-10-CM | POA: Diagnosis not present

## 2022-09-12 DIAGNOSIS — M25632 Stiffness of left wrist, not elsewhere classified: Secondary | ICD-10-CM | POA: Diagnosis not present

## 2022-09-12 DIAGNOSIS — M25532 Pain in left wrist: Secondary | ICD-10-CM | POA: Diagnosis not present

## 2022-09-12 DIAGNOSIS — M7712 Lateral epicondylitis, left elbow: Secondary | ICD-10-CM | POA: Diagnosis not present

## 2022-09-15 ENCOUNTER — Ambulatory Visit: Payer: BC Managed Care – PPO | Attending: General Surgery

## 2022-09-15 ENCOUNTER — Ambulatory Visit: Payer: BC Managed Care – PPO | Admitting: Sports Medicine

## 2022-09-15 VITALS — Wt 163.4 lb

## 2022-09-15 DIAGNOSIS — Z483 Aftercare following surgery for neoplasm: Secondary | ICD-10-CM | POA: Insufficient documentation

## 2022-09-15 NOTE — Therapy (Signed)
OUTPATIENT PHYSICAL THERAPY SOZO SCREENING NOTE   Patient Name: Mariah Burnett MRN: [id number] DOB:05-21-1975, 47 y.o., female Today's Date: 09/15/2022  PCP: Billie Lade, MD REFERRING PROVIDER: Griselda Miner, MD   PT End of Session - 09/15/22 1655     Visit Number 15   # unchanged due to screen only   PT Start Time 1652    PT Stop Time 1657    PT Time Calculation (min) 5 min    Activity Tolerance Patient tolerated treatment well    Behavior During Therapy [facility] for tasks assessed/performed             Past Medical History:  Diagnosis Date   Arthritis    Asthma    Breast cancer (HCC) 04/24/2022   MVP (mitral valve prolapse)    Although no evidence per echo in 2009. Trace MR   Palpitation    Scoliosis    Urticaria    Vaginal Pap smear, abnormal    Past Surgical History:  Procedure Laterality Date   BREAST BIOPSY Left 04/24/2022   [place] LT BREAST BX W LOC DEV 1ST LESION IMG BX SPEC US GUIDE 04/24/2022 [facility]   BREAST BIOPSY Left 04/24/2022   [place] LT BREAST BX W LOC DEV EA ADD LESION IMG BX SPEC US GUIDE 04/24/2022 [facility]   BREAST BIOPSY Left 06/04/2022   [place] LT RADIOACTIVE SEED LOC 06/04/2022 [facility]   BREAST BIOPSY Left 06/04/2022   [place] LT RADIOACTIVE SEED EA ADD LESION 06/04/2022 [facility]MO[facility]   BREAST LUMPECTOMY WITH RADIOACTIVE SEED AND SENTINEL LYMPH NODE BIOPSY Left 06/06/2022   Procedure: LEFT BREAST LUMPECTOMY WITH RAADIOACTIVE SEED AND SENTINEL LYMPH NODE BIOPSY;  Surgeon: Griselda Miner, MD;  Location: [facility];  Service: General;  Laterality: Left;   KIDNEY STONE SURGERY Bilateral 2000   RADIOACTIVE SEED GUIDED AXILLARY SENTINEL LYMPH NODE Left 06/06/2022   Procedure: RADIOACTIVE SEED GUIDED LEFT AXILLARY SENTINEL LYMPH NODE DISSECTION;  Surgeon: Griselda Miner, MD;  Location: [facility];  Service: General;  Laterality: Left;   WISDOM TOOTH EXTRACTION Bilateral 1991   Patient Active Problem List   Diagnosis  Date Noted   Genetic testing 07/25/2022   Family history of breast cancer 07/15/2022   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 (HCC) 05/12/2022   Chest tightness 03/04/2022   Family history of coronary artery disease in mother 03/04/2022   Hyperlipidemia 10/17/2021   Encounter to establish care 10/17/2021   Biceps tendinitis, left 10/17/2021   Mild intermittent asthma without complication 01/14/2021   Seasonal and perennial allergic rhinitis 01/14/2021   Dizziness 04/23/2020   Abnormal auditory perception of both ears 04/23/2020   Heart murmur 03/17/2018   Asthma 03/17/2018   Shortness of breath 09/25/2010   Palpitation    MVP (mitral valve prolapse)    Scoliosis     REFERRING DIAG: left breast cancer at risk for lymphedema  THERAPY DIAG: Aftercare following surgery for neoplasm  PERTINENT HISTORY: Lt lumpectomy due to [facility] 06/06/22. 1/4+ nodes. Radiation planned.   PRECAUTIONS: left UE Lymphedema risk, None  SUBJECTIVE: Pt returns for her 3 month L-Dex screen.   PAIN:  Are you having pain? No  SOZO SCREENING: Patient was assessed today using the SOZO machine to determine the lymphedema index score. This was compared to her baseline score. It was determined that she is within the recommended range when compared to her baseline and no further action is needed at this time. She will continue  SOZO screenings. These are done every 3 months for 2 years post operatively followed by every 6 months for 2 years, and then annually.   L-DEX FLOWSHEETS - 09/15/22 1600       L-DEX LYMPHEDEMA SCREENING   Measurement Type Unilateral    L-DEX MEASUREMENT EXTREMITY Upper Extremity    POSITION  Standing    DOMINANT SIDE Right    At Risk Side Left    BASELINE SCORE (UNILATERAL) 0.6    L-DEX SCORE (UNILATERAL) 2    VALUE CHANGE (UNILAT) 1.4               Hermenia Bers, PTA 09/15/2022, 4:58 PM

## 2022-09-16 DIAGNOSIS — M25532 Pain in left wrist: Secondary | ICD-10-CM | POA: Diagnosis not present

## 2022-09-16 DIAGNOSIS — M25632 Stiffness of left wrist, not elsewhere classified: Secondary | ICD-10-CM | POA: Diagnosis not present

## 2022-09-16 DIAGNOSIS — M7712 Lateral epicondylitis, left elbow: Secondary | ICD-10-CM | POA: Diagnosis not present

## 2022-09-16 DIAGNOSIS — M25522 Pain in left elbow: Secondary | ICD-10-CM | POA: Diagnosis not present

## 2022-09-17 ENCOUNTER — Ambulatory Visit (INDEPENDENT_AMBULATORY_CARE_PROVIDER_SITE_OTHER): Payer: BC Managed Care – PPO | Admitting: Sports Medicine

## 2022-09-17 ENCOUNTER — Encounter: Payer: Self-pay | Admitting: Sports Medicine

## 2022-09-17 DIAGNOSIS — M7502 Adhesive capsulitis of left shoulder: Secondary | ICD-10-CM | POA: Diagnosis not present

## 2022-09-17 DIAGNOSIS — M7712 Lateral epicondylitis, left elbow: Secondary | ICD-10-CM | POA: Diagnosis not present

## 2022-09-17 NOTE — Progress Notes (Signed)
Patient had physical therapy yesterday and says that her elbow feels better today than it did yesterday. She states that they worked on her elbow with their hands and a scraping tool and that they mentioned feeling a "grit." She says that she has had extra work in the last week and thinks that may be adding to additional discomfort.

## 2022-09-17 NOTE — Progress Notes (Signed)
Mariah Burnett - 47 y.o. female MRN [id number]  Date of birth: Jul 14, 1975  Office Visit Note: Visit Date: 09/17/2022 PCP: Billie Lade, MD Referred by: Billie Lade, MD  Subjective: Chief Complaint  Patient presents with   Left Elbow - Follow-up, Pain   HPI: Mariah Burnett is a pleasant 47 y.o. female who presents today for left lateral elbow epicondylitis with likely superimposed partial tear of CET.  L- Elbow -she is finding some improvement with extracorporeal shockwave therapy and physical therapy.  They have been doing some scraping for Fascial Restriction.  She has worked more in the last week and feels like this slightly exacerbated but overall still somewhat better. No medicine.  She has been doing some new exercises for the shoulder and feels like this is certainly much improved as well.  Pertinent ROS were reviewed with the patient and found to be negative unless otherwise specified above in HPI.   Assessment & Plan: Visit Diagnoses:  1. Lateral epicondylitis of left elbow   2. Adhesive capsulitis of left shoulder    Plan: Mariah Burnett has been making some improvements with her elbow pain and response to extracorporeal shockwave therapy and her formalized PT/HEP.  We did discuss could be treated at this can take 6 months or more to fully heal.  We did repeat ESWT today.  Would like her to reduce her scraping at PT to once weekly.  She may use ice, over-the-counter anti-inflammatories only as needed.  She will follow-up next week for repeat shockwave and further evaluation.  Would like her to be about one third of the way improved after next session.  If she is not, we may discontinue shockwave and consider other modalities.  Have a discussion today on PRP injection therapy, information provided for her.  Follow-up: Return in about 1 week (around 09/24/2022) for L-elbow (reg visit).   Meds & Orders: No orders of the defined types were placed in this encounter.  No orders of  the defined types were placed in this encounter.    Procedures: Procedure: ECSWT Indications:  Left elbow lateral epicondylitis   Procedure Details Consent: Risks of procedure as well as the alternatives and risks of each were explained to the patient.  Verbal consent for procedure obtained. Time Out: Verified patient identification, verified procedure, site was marked, verified correct patient position. The area was cleaned with alcohol swab.     The left lateral epicondyle and common extensor tendon was targeted for Extracorporeal shockwave therapy.    Preset: Lateral epicondylitis Power Level: 110 mJ Frequency: 12 Hz Impulse/cycles: 2500 (250 directly over LE) Head size: Regular     Patient tolerated procedure well without immediate complications.       Clinical History: No specialty comments available.  She reports that she has never smoked. She has never used smokeless tobacco. No results for input(s): "HGBA1C", "LABURIC" in the last 8760 hours.  Objective:    Physical Exam  Gen: Well-appearing, in no acute distress; non-toxic CV:  Well-perfused. Warm.  Resp: Breathing unlabored on room air; no wheezing. Psych: Fluid speech in conversation; appropriate affect; normal thought process Neuro: Sensation intact throughout. No gross coordination deficits.   Ortho Exam - L elbow: + Positive TTP over the lateral epicondyle.  There is mild soft tissue swelling over the common extensor tendon origin today.  No redness or effusion.  Full range of motion with flexion and extension.  Imaging: No results found.  Past Medical/Family/Surgical/Social History: Medications & Allergies reviewed per  EMR, new medications updated. Patient Active Problem List   Diagnosis Date Noted   Genetic testing 07/25/2022   Family history of breast cancer 07/15/2022   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 (HCC) 05/12/2022   Chest tightness 03/04/2022   Family history of  coronary artery disease in mother 03/04/2022   Hyperlipidemia 10/17/2021   Encounter to establish care 10/17/2021   Biceps tendinitis, left 10/17/2021   Mild intermittent asthma without complication 01/14/2021   Seasonal and perennial allergic rhinitis 01/14/2021   Dizziness 04/23/2020   Abnormal auditory perception of both ears 04/23/2020   Heart murmur 03/17/2018   Asthma 03/17/2018   Shortness of breath 09/25/2010   Palpitation    MVP (mitral valve prolapse)    Scoliosis    Past Medical History:  Diagnosis Date   Arthritis    Asthma    Breast cancer (HCC) 04/24/2022   MVP (mitral valve prolapse)    Although no evidence per echo in 2009. Trace MR   Palpitation    Scoliosis    Urticaria    Vaginal Pap smear, abnormal    Family History  Problem Relation Age of Onset   Heart disease Mother    Heart attack Mother    Breast cancer Mother 23   Urticaria Mother    Renal cancer Father 5       metastatic   Breast cancer Maternal Aunt 68   Breast cancer Maternal Aunt 85   Breast cancer Paternal Grandmother 69   Lung cancer Paternal Grandfather        smoked   Emphysema Paternal Grandfather    Past Surgical History:  Procedure Laterality Date   BREAST BIOPSY Left 04/24/2022   [place] LT BREAST BX W LOC DEV 1ST LESION IMG BX SPEC US GUIDE 04/24/2022 [facility]OUND   BREAST BIOPSY Left 04/24/2022   [place] LT BREAST BX W LOC DEV EA ADD LESION IMG BX SPEC US GUIDE 04/24/2022 [facility]OUND   BREAST BIOPSY Left 06/04/2022   [place] LT RADIOACTIVE SEED LOC 06/04/2022 [facility]MO[facility]   BREAST BIOPSY Left 06/04/2022   [place] LT RADIOACTIVE SEED EA ADD LESION 06/04/2022 [facility]AMMO[facility]AP[facility]   BREAST LUMPECTOMY WITH RADIOACTIVE SEED AND SENTINEL LYMPH NODE BIOPSY Left 06/06/2022   Procedure: LEFT BREAST LUMPECTOMY WITH RAADIOACTIVE SEED AND SENTINEL LYMPH NODE BIOPSY;  Surgeon: Griselda Miner, MD;  Location: [facility];  Service: General;  Laterality: Left;   KIDNEY STONE SURGERY Bilateral  2000   RADIOACTIVE SEED GUIDED AXILLARY SENTINEL LYMPH NODE Left 06/06/2022   Procedure: RADIOACTIVE SEED GUIDED LEFT AXILLARY SENTINEL LYMPH NODE DISSECTION;  Surgeon: Griselda Miner, MD;  Location: [facility];  Service: General;  Laterality: Left;   WISDOM TOOTH EXTRACTION Bilateral 1991   Social History   Occupational History   Not on file  Tobacco Use   Smoking status: Never   Smokeless tobacco: Never  Vaping Use   Vaping status: Never Used  Substance and Sexual Activity   Alcohol use: Yes    Comment: occ   Drug use: No   Sexual activity: Yes    Birth control/protection: None

## 2022-09-17 NOTE — Patient Instructions (Signed)
Dr. Shon Baton' Instructions and What to Expect for PRP Injections:  Platelet-rich plasma is used in musculoskeletal medicine to focus your own body's ability to heal. It has several well-done published research trials (RCT) which demonstrate both its effectiveness and safety in many musculoskeletal conditions, including osteoarthritis, tendinopathies, partial tendon tears, and damaged vertebral discs. PRP has been in clinical use since the 1990's. Many people know that platelets form a clot if there is a cut in the skin. It turns out that platelets do not only form a clot, but they also start the body's own repair process. When platelets activate to form a clot, they release alpha granules which have hundreds of chemical messengers in them that initiate and organize repair to the damaged tissue. Precisely placing PRP into the site of injury will initiate the healing process by activating on the damaged cartilage, bone or tendon. This is an inflammatory process, and inflammation is the vital first phase of healing.  What to expect and how to prepare for PRP:   Depending on the procedure, you may need to arrange for a driver to bring you home (i.e. procedure on right/driving leg). IF you are having a lower extremity procedure, we can provide crutches as needed, but these are not routinely needed.   10 days prior to the procedure: Stop taking anti-inflammatory drugs like Ibuprofen/Motrin, Advil, Naprosyn, Celebrex, or Meloxicam. Even aspirin should be stopped (but need to discuss this with Dr. Shon Baton and your cardiologist beforehand). Let Dr. Shon Baton know if you have been taking prednisone or other corticosteroids in the last 30 days as this can negatively interfere with the PRP process.   The day before the procedure: thoroughly shower and clean your skin.    The day of the procedure: Wear loose-fitting clothing like sweatpants or shorts. If you are having an upper body procedure wear a top that can button or  zip up. Please show up at least 15 minutes early for your appointment, as we will need to take you back to draw your blood prior to the procedure.    Things to avoid prior to procedure: tobacco/nicotine, alcohol, fatty foods. Tobacco is a potent toxin and its use constricts small blood vessels which are needed for tissue repair. Tobacco/nicotine use will limit the effectiveness of any treatment and stopping tobacco use is one of the single greatest actions you can take to improve your health. Avoid toxins like alcohol, which inhibits and depresses the cells needed for tissue repair.  PRP will initiate healing and a productive inflammation, and PRP therapy will make the body part treated sore for the first 3-4 days up to two weeks. Anti-inflammatory drugs (i.e. ibuprofen, Naprosyn, Celebrex) and corticosteroids such as prednisone can blunt or stop this process, so it is important to not take any anti-inflammatory drugs for 10 days before getting PRP therapy, or for at least two weeks after PRP therapy.   Depending on the body part injected, you may be in a sling or on crutches for several days. Most of the time these are not needed, but it is important to allow time for the body part to rest after the injection. By keeping the body part treated relaxed and not loading the body part the PRP can bind in place and do its job. If you push the body part too early, this may make the PRP less effective.  What happens during the PRP procedure?  Platelet rich plasma is made by taking some of your blood and performing a  two-stage centrifuge process on it to concentrate the PRP. First, your blood is drawn into a syringe with a small amount of anti-coagulant in it (this is to keep the blood from clotting during this process). The amount of blood drawn is usually about 10-30 milliliters, depending on how much PRP is needed for the treatment. Then the blood is transferred in a sterile fashion into a centrifuge tube. It  is then centrifuged for the first cycle where the red blood cells are isolated and discarded. In the second centrifuge cycle, the platelet-rich fraction of the remaining plasma is concentrated and placed in a syringe. The skin at the injection site is numbed with a small amount of topical cooling spray. Dr. Shon Baton will then precisely inject the PRP into the injury site using ultrasound guidance.  What to do after your procedure:   NO anti-inflammatories (Ibuprofen/Motrin, Aleve, Meloxicam, etc.) for 2 weeks after injection. It is OK to use Tylenol only if needed. I can also prescribe you specific medicine to control any discomfort you may have after the procedure if needed.   NO applying ice to the affected area for 2 weeks after the injection. It is OK to use heat.   Rest the affected body part. By keeping the body part treated relaxed and not loading the body part the PRP can bind in place and do its job. Be sure to ask Dr. Shon Baton specific post-injection rest and guidelines to follow following your injection, as these will differ slightly depending on what type of PRP injection you receive. In general:   Allow 3-4 days of rest from physical labor or repetitive activity for the affected body part. It is ok to move the area, but no lifting or strenuous activity. After 3 days, unless otherwise instructed, the treated body part should be used and slowly moved through its full range of motion. It will be sore, but you will not damage it by moving it, in fact it needs to move to heal.   No strenuous activity, lifting weights, or dedicated therapy until the 2-week mark from the injection. After 2 weeks from the injection, this is when it is most advantageous to start physical therapy.   After two weeks, you may begin returning to activity, but it is a good idea to avoid activities that specifically hurt you before being treated. Exercise is vital to good health and finding a way to cross train around your  injury is important not only for your physical health, but your mental health as well. Ask me about cross training options for your injury. Some brief (10 minutes or less) period of heat therapy will not hurt the rehab, but it is not required. Usually, depending on the initial injury, physical therapy is started from two weeks to three weeks after injection. Improvements in pain and function should be expected from 6 weeks to 12 weeks after injection and some injuries may require more than one treatment.

## 2022-09-18 DIAGNOSIS — M7712 Lateral epicondylitis, left elbow: Secondary | ICD-10-CM | POA: Diagnosis not present

## 2022-09-18 DIAGNOSIS — M25522 Pain in left elbow: Secondary | ICD-10-CM | POA: Diagnosis not present

## 2022-09-18 DIAGNOSIS — M25532 Pain in left wrist: Secondary | ICD-10-CM | POA: Diagnosis not present

## 2022-09-18 DIAGNOSIS — M25632 Stiffness of left wrist, not elsewhere classified: Secondary | ICD-10-CM | POA: Diagnosis not present

## 2022-09-19 ENCOUNTER — Telehealth: Payer: Self-pay

## 2022-09-19 NOTE — Telephone Encounter (Signed)
Pt called and states she is experiencing light spotting vaginally and wanted to call to let us know. Advised pt it is normal to experience light bleeding in the first month of Zoladex as essentially her body is in withdrawal. She denies heavy bleeding and knows she should go to ED if she begins saturating sanitary napkins. She verbalized thanks and understanding.

## 2022-09-23 DIAGNOSIS — M25632 Stiffness of left wrist, not elsewhere classified: Secondary | ICD-10-CM | POA: Diagnosis not present

## 2022-09-23 DIAGNOSIS — R0602 Shortness of breath: Secondary | ICD-10-CM | POA: Diagnosis not present

## 2022-09-23 DIAGNOSIS — Z780 Asymptomatic menopausal state: Secondary | ICD-10-CM | POA: Diagnosis not present

## 2022-09-23 DIAGNOSIS — M25532 Pain in left wrist: Secondary | ICD-10-CM | POA: Diagnosis not present

## 2022-09-23 DIAGNOSIS — C50512 Malignant neoplasm of lower-outer quadrant of left female breast: Secondary | ICD-10-CM | POA: Diagnosis not present

## 2022-09-23 DIAGNOSIS — Z170 Estrogen receptor positive status [ER+]: Secondary | ICD-10-CM | POA: Diagnosis not present

## 2022-09-23 DIAGNOSIS — M7712 Lateral epicondylitis, left elbow: Secondary | ICD-10-CM | POA: Diagnosis not present

## 2022-09-23 DIAGNOSIS — R5383 Other fatigue: Secondary | ICD-10-CM | POA: Diagnosis not present

## 2022-09-23 DIAGNOSIS — L819 Disorder of pigmentation, unspecified: Secondary | ICD-10-CM | POA: Diagnosis not present

## 2022-09-23 DIAGNOSIS — L309 Dermatitis, unspecified: Secondary | ICD-10-CM | POA: Diagnosis not present

## 2022-09-23 DIAGNOSIS — M25522 Pain in left elbow: Secondary | ICD-10-CM | POA: Diagnosis not present

## 2022-09-25 ENCOUNTER — Other Ambulatory Visit: Payer: Self-pay | Admitting: Allergy & Immunology

## 2022-09-25 DIAGNOSIS — M25632 Stiffness of left wrist, not elsewhere classified: Secondary | ICD-10-CM | POA: Diagnosis not present

## 2022-09-25 DIAGNOSIS — M25532 Pain in left wrist: Secondary | ICD-10-CM | POA: Diagnosis not present

## 2022-09-25 DIAGNOSIS — M25522 Pain in left elbow: Secondary | ICD-10-CM | POA: Diagnosis not present

## 2022-09-25 DIAGNOSIS — M7712 Lateral epicondylitis, left elbow: Secondary | ICD-10-CM | POA: Diagnosis not present

## 2022-09-30 DIAGNOSIS — M7712 Lateral epicondylitis, left elbow: Secondary | ICD-10-CM | POA: Diagnosis not present

## 2022-09-30 DIAGNOSIS — M25632 Stiffness of left wrist, not elsewhere classified: Secondary | ICD-10-CM | POA: Diagnosis not present

## 2022-09-30 DIAGNOSIS — M25522 Pain in left elbow: Secondary | ICD-10-CM | POA: Diagnosis not present

## 2022-09-30 DIAGNOSIS — M25532 Pain in left wrist: Secondary | ICD-10-CM | POA: Diagnosis not present

## 2022-10-01 ENCOUNTER — Ambulatory Visit (INDEPENDENT_AMBULATORY_CARE_PROVIDER_SITE_OTHER): Payer: BC Managed Care – PPO | Admitting: Sports Medicine

## 2022-10-01 ENCOUNTER — Encounter: Payer: Self-pay | Admitting: Sports Medicine

## 2022-10-01 DIAGNOSIS — M7502 Adhesive capsulitis of left shoulder: Secondary | ICD-10-CM

## 2022-10-01 DIAGNOSIS — M7712 Lateral epicondylitis, left elbow: Secondary | ICD-10-CM

## 2022-10-01 DIAGNOSIS — M25512 Pain in left shoulder: Secondary | ICD-10-CM

## 2022-10-01 DIAGNOSIS — G8929 Other chronic pain: Secondary | ICD-10-CM

## 2022-10-01 MED ORDER — MELOXICAM 15 MG PO TABS: 15.0000 mg | ORAL_TABLET | Freq: Every day | ORAL | 0 refills | Status: DC | PRN

## 2022-10-01 NOTE — Progress Notes (Signed)
Patient says that she is feeling better. She says that the area over the bone is no longer painful and she mostly feels it down her arm now. She feels that she is getting some strength back. They have cut back to scraping in PT once a week rather than twice.

## 2022-10-01 NOTE — Progress Notes (Signed)
Mariah Burnett - 47 y.o. female MRN [id number]  Date of birth: Nov 02, 1975  Office Visit Note: Visit Date: 10/01/2022 PCP: Billie Lade, MD Referred by: Billie Lade, MD  Subjective: Chief Complaint  Patient presents with   Left Elbow - Follow-up   HPI: Mariah Burnett is a pleasant 47 y.o. female who presents today for follow-up of left elbow lateral epicondylitis with likely superimposed partial tear of CET. Also with chronic left shoulder stiffness, s/p frozen shoulder.  We have performed 3 sessions of extracorporeal shockwave therapy and she is doing formalized physical therapy, making good improvements.  Her pain is much improved today.  Not as much pain over the bone, more so in the extensor tendons.  She is taking meloxicam 15 mg only as needed, recently had to take this as physical therapy did slightly exacerbate her pain/made her sore.  Performing physical therapy for her previous adhesive capsulitis and shoulder stiffness as well.  Pertinent ROS were reviewed with the patient and found to be negative unless otherwise specified above in HPI.   Assessment & Plan: Visit Diagnoses:  1. Lateral epicondylitis of left elbow   2. Adhesive capsulitis of left shoulder   3. Chronic left shoulder pain    Plan: Danajia has made rather significant improvements with extracorporeal shockwave therapy and formalized PT for her lateral epicondylitis.  We did repeat ESWT today.  Her shoulder is vastly improved, no real pain but still has a very slight degree of stiffness that she has been working through.  She will continue formalized physical therapy, I also discussed at home exercises to work on stretching out the shoulder and capsule.  I did refill her meloxicam 15 mg which she will take only a few days in a row as needed for soreness or pain status post physical therapy and other activity.  We will see her back in about 1-2 weeks for repeat ESWT, plan for 1-2 more treatments depending on her  degree of improvement.  Follow-up: Return for f/u 1-2 weeks for left elbow .   Meds & Orders:  Meds ordered this encounter  Medications   meloxicam (MOBIC) 15 MG tablet    Sig: Take 1 tablet (15 mg total) by mouth daily as needed.    Dispense:  30 tablet    Refill:  0   No orders of the defined types were placed in this encounter.    Procedures: Procedure: ECSWT Indications:  Left elbow lateral epicondylitis   Procedure Details Consent: Risks of procedure as well as the alternatives and risks of each were explained to the patient.  Verbal consent for procedure obtained. Time Out: Verified patient identification, verified procedure, site was marked, verified correct patient position. The area was cleaned with alcohol swab.     The left lateral epicondyle and common extensor tendon was targeted for Extracorporeal shockwave therapy.    Preset: Lateral epicondylitis Power Level: 110 mJ Frequency: 12 Hz Impulse/cycles: 2500 (200 directly over LE) Head size: Regular     Patient tolerated procedure well without immediate complications.       Clinical History: No specialty comments available.  She reports that she has never smoked. She has never used smokeless tobacco. No results for input(s): "HGBA1C", "LABURIC" in the last 8760 hours.  Objective:   Vital Signs: There were no vitals taken for this visit.  Physical Exam  Gen: Well-appearing, in no acute distress; non-toxic CV:  Well-perfused. Warm.  Resp: Breathing unlabored on room air; no wheezing. Psych:  Fluid speech in conversation; appropriate affect; normal thought process Neuro: Sensation intact throughout. No gross coordination deficits.   Ortho Exam - Left elbow: Very minimal tenderness at the common extensor tendon origin.  Full range of motion at the elbow.  Less pain with provocative motions of the lateral epicondyle today.  No redness swelling or effusion.  - Left shoulder: No bony tenderness.  There is about 8  degrees less of external rotation and about 5 degrees less of internal rotation of the left shoulder compared to the right but otherwise has full strength and range of motion compared to contralateral arm.  Imaging: No results found.  Past Medical/Family/Surgical/Social History: Medications & Allergies reviewed per EMR, new medications updated. Patient Active Problem List   Diagnosis Date Noted   Genetic testing 07/25/2022   Family history of breast cancer 07/15/2022   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 (HCC) 05/12/2022   Chest tightness 03/04/2022   Family history of coronary artery disease in mother 03/04/2022   Hyperlipidemia 10/17/2021   Encounter to establish care 10/17/2021   Biceps tendinitis, left 10/17/2021   Mild intermittent asthma without complication 01/14/2021   Seasonal and perennial allergic rhinitis 01/14/2021   Dizziness 04/23/2020   Abnormal auditory perception of both ears 04/23/2020   Heart murmur 03/17/2018   Asthma 03/17/2018   Shortness of breath 09/25/2010   Palpitation    MVP (mitral valve prolapse)    Scoliosis    Past Medical History:  Diagnosis Date   Arthritis    Asthma    Breast cancer (HCC) 04/24/2022   MVP (mitral valve prolapse)    Although no evidence per echo in 2009. Trace MR   Palpitation    Scoliosis    Urticaria    Vaginal Pap smear, abnormal    Family History  Problem Relation Age of Onset   Heart disease Mother    Heart attack Mother    Breast cancer Mother 25   Urticaria Mother    Renal cancer Father 78       metastatic   Breast cancer Maternal Aunt 25   Breast cancer Maternal Aunt 79   Breast cancer Paternal Grandmother 18   Lung cancer Paternal Grandfather        smoked   Emphysema Paternal Grandfather    Past Surgical History:  Procedure Laterality Date   BREAST BIOPSY Left 04/24/2022   [place] LT BREAST BX W LOC DEV 1ST LESION IMG BX SPEC US GUIDE 04/24/2022 [facility]   BREAST BIOPSY Left  04/24/2022   [place] LT BREAST BX W LOC DEV EA ADD LESION IMG BX SPEC US GUIDE 04/24/2022 [facility]   BREAST BIOPSY Left 06/04/2022   [place] LT RADIOACTIVE SEED LOC 06/04/2022 [facility]   BREAST BIOPSY Left 06/04/2022   [place] LT RADIOACTIVE SEED EA ADD LESION 06/04/2022 [facility]   BREAST LUMPECTOMY WITH RADIOACTIVE SEED AND SENTINEL LYMPH NODE BIOPSY Left 06/06/2022   Procedure: LEFT BREAST LUMPECTOMY WITH RAADIOACTIVE SEED AND SENTINEL LYMPH NODE BIOPSY;  Surgeon: Griselda Miner, MD;  Location: [facility];  Service: General;  Laterality: Left;   KIDNEY STONE SURGERY Bilateral 2000   RADIOACTIVE SEED GUIDED AXILLARY SENTINEL LYMPH NODE Left 06/06/2022   Procedure: RADIOACTIVE SEED GUIDED LEFT AXILLARY SENTINEL LYMPH NODE DISSECTION;  Surgeon: Griselda Miner, MD;  Location: [facility];  Service: General;  Laterality: Left;   WISDOM TOOTH EXTRACTION Bilateral 1991   Social History   Occupational History   Not  on file  Tobacco Use   Smoking status: Never   Smokeless tobacco: Never  Vaping Use   Vaping status: Never Used  Substance and Sexual Activity   Alcohol use: Yes    Comment: occ   Drug use: No   Sexual activity: Yes    Birth control/protection: None

## 2022-10-02 ENCOUNTER — Inpatient Hospital Stay: Payer: BC Managed Care – PPO

## 2022-10-02 ENCOUNTER — Inpatient Hospital Stay: Payer: BC Managed Care – PPO | Attending: Hematology and Oncology

## 2022-10-02 ENCOUNTER — Other Ambulatory Visit: Payer: Self-pay | Admitting: *Deleted

## 2022-10-02 VITALS — BP 126/94 | HR 92 | Temp 98.8°F | Resp 17

## 2022-10-02 DIAGNOSIS — Z170 Estrogen receptor positive status [ER+]: Secondary | ICD-10-CM

## 2022-10-02 DIAGNOSIS — C50512 Malignant neoplasm of lower-outer quadrant of left female breast: Secondary | ICD-10-CM | POA: Insufficient documentation

## 2022-10-02 DIAGNOSIS — Z5111 Encounter for antineoplastic chemotherapy: Secondary | ICD-10-CM | POA: Diagnosis not present

## 2022-10-02 DIAGNOSIS — M25522 Pain in left elbow: Secondary | ICD-10-CM | POA: Diagnosis not present

## 2022-10-02 DIAGNOSIS — M25532 Pain in left wrist: Secondary | ICD-10-CM | POA: Diagnosis not present

## 2022-10-02 DIAGNOSIS — M25632 Stiffness of left wrist, not elsewhere classified: Secondary | ICD-10-CM | POA: Diagnosis not present

## 2022-10-02 DIAGNOSIS — M7712 Lateral epicondylitis, left elbow: Secondary | ICD-10-CM | POA: Diagnosis not present

## 2022-10-02 LAB — CBC WITH DIFFERENTIAL (CANCER CENTER ONLY)
Abs Immature Granulocytes: 0.02 10*3/uL (ref 0.00–0.07)
Basophils Absolute: 0 10*3/uL (ref 0.0–0.1)
Basophils Relative: 1 %
Eosinophils Absolute: 0.1 10*3/uL (ref 0.0–0.5)
Eosinophils Relative: 2 %
HCT: 32.3 % — ABNORMAL LOW (ref 36.0–46.0)
Hemoglobin: 11.2 g/dL — ABNORMAL LOW (ref 12.0–15.0)
Immature Granulocytes: 1 %
Lymphocytes Relative: 21 %
Lymphs Abs: 0.7 10*3/uL (ref 0.7–4.0)
MCH: 33.1 pg (ref 26.0–34.0)
MCHC: 34.7 g/dL (ref 30.0–36.0)
MCV: 95.6 fL (ref 80.0–100.0)
Monocytes Absolute: 0.4 10*3/uL (ref 0.1–1.0)
Monocytes Relative: 12 %
Neutro Abs: 2.3 10*3/uL (ref 1.7–7.7)
Neutrophils Relative %: 63 %
Platelet Count: 210 10*3/uL (ref 150–400)
RBC: 3.38 MIL/uL — ABNORMAL LOW (ref 3.87–5.11)
RDW: 12.9 % (ref 11.5–15.5)
WBC Count: 3.5 10*3/uL — ABNORMAL LOW (ref 4.0–10.5)
nRBC: 0 % (ref 0.0–0.2)

## 2022-10-02 LAB — CMP (CANCER CENTER ONLY)
ALT: 13 U/L (ref 0–44)
AST: 15 U/L (ref 15–41)
Albumin: 4.2 g/dL (ref 3.5–5.0)
Alkaline Phosphatase: 47 U/L (ref 38–126)
Anion gap: 5 (ref 5–15)
BUN: 13 mg/dL (ref 6–20)
CO2: 26 mmol/L (ref 22–32)
Calcium: 9.5 mg/dL (ref 8.9–10.3)
Chloride: 107 mmol/L (ref 98–111)
Creatinine: 0.67 mg/dL (ref 0.44–1.00)
GFR, Estimated: >60 mL/min (ref >60)
Glucose, Bld: 88 mg/dL (ref 70–99)
Potassium: 4.3 mmol/L (ref 3.5–5.1)
Sodium: 138 mmol/L (ref 135–145)
Total Bilirubin: 1 mg/dL (ref 0.3–1.2)
Total Protein: 7 g/dL (ref 6.5–8.1)

## 2022-10-02 MED ORDER — GOSERELIN ACETATE 3.6 MG ~~LOC~~ IMPL
3.6000 mg | DRUG_IMPLANT | Freq: Once | SUBCUTANEOUS | Status: AC
  Administered 2022-10-02: 3.6 mg via SUBCUTANEOUS
  Filled 2022-10-02: qty 3.6

## 2022-10-07 DIAGNOSIS — M25632 Stiffness of left wrist, not elsewhere classified: Secondary | ICD-10-CM | POA: Diagnosis not present

## 2022-10-07 DIAGNOSIS — M25532 Pain in left wrist: Secondary | ICD-10-CM | POA: Diagnosis not present

## 2022-10-07 DIAGNOSIS — M7712 Lateral epicondylitis, left elbow: Secondary | ICD-10-CM | POA: Diagnosis not present

## 2022-10-07 DIAGNOSIS — M25522 Pain in left elbow: Secondary | ICD-10-CM | POA: Diagnosis not present

## 2022-10-10 DIAGNOSIS — M25522 Pain in left elbow: Secondary | ICD-10-CM | POA: Diagnosis not present

## 2022-10-10 DIAGNOSIS — M25632 Stiffness of left wrist, not elsewhere classified: Secondary | ICD-10-CM | POA: Diagnosis not present

## 2022-10-10 DIAGNOSIS — M25532 Pain in left wrist: Secondary | ICD-10-CM | POA: Diagnosis not present

## 2022-10-10 DIAGNOSIS — M7712 Lateral epicondylitis, left elbow: Secondary | ICD-10-CM | POA: Diagnosis not present

## 2022-10-13 ENCOUNTER — Encounter: Payer: Self-pay | Admitting: Sports Medicine

## 2022-10-13 ENCOUNTER — Ambulatory Visit (INDEPENDENT_AMBULATORY_CARE_PROVIDER_SITE_OTHER): Payer: BC Managed Care – PPO | Admitting: Sports Medicine

## 2022-10-13 DIAGNOSIS — M25522 Pain in left elbow: Secondary | ICD-10-CM

## 2022-10-13 DIAGNOSIS — G8929 Other chronic pain: Secondary | ICD-10-CM

## 2022-10-13 DIAGNOSIS — M7712 Lateral epicondylitis, left elbow: Secondary | ICD-10-CM | POA: Diagnosis not present

## 2022-10-13 NOTE — Progress Notes (Signed)
Mariah Burnett - 47 y.o. female MRN [id number]  Date of birth: 1975/04/03  Office Visit Note: Visit Date: 10/13/2022 PCP: Billie Lade, MD Referred by: Billie Lade, MD  Subjective: Chief Complaint  Patient presents with   Left Elbow - Follow-up   HPI: Mariah Burnett is a pleasant 47 y.o. female who presents today for left elbow lateral epicondylitis with likely superimposed partial tear of CET.  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  This did seem to flared up but she was wearing her counterforce brace/strap.  She did use [employer], massage and some Aleve yesterday with some improvement in her pain.  Prior to her exacerbation, she did feel like she was at least 90% improved from prior treatment.  Pertinent ROS were reviewed with the patient and found to be negative unless otherwise specified above in HPI.   Assessment & Plan: Visit Diagnoses:  1. Lateral epicondylitis of left elbow   2. Chronic pain of left elbow    Plan: Sakena had been making excellent improvement with extracorporeal shockwave therapy as well as formalized PT for her lateral epicondylitis.  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  Overall, she is still doing much better.  We did repeat ESWT today.  She will hold on further meloxicam use unless her pain still bothersome by Wednesday and she can take 1 dose for the remainder of the week and then discontinue.  She will continue her home therapy as well as her formalized PT.  She is interested in trying dry needling once weekly, I am okay with this.  I will see her back in about 1-1.5 weeks for reevaluation and that she is vastly improved.  Follow-up: Return for  1-1.5 week for L-elbow .   Meds & Orders: No orders of the defined types were placed in this encounter.  No orders of the defined types were placed in this encounter.    Procedures: Procedure: ECSWT Indications:  Left elbow lateral epicondylitis   Procedure Details Consent: Risks of procedure as well as the alternatives and risks of each were explained to the patient.  Verbal consent for procedure obtained. Time Out: Verified patient identification, verified procedure, site was marked, verified correct patient position. The area was cleaned with alcohol swab.     The left lateral epicondyle and common extensor tendon was targeted for Extracorporeal shockwave therapy.    Preset: Lateral epicondylitis Power Level: 100-110 mJ Frequency: 12 Hz Impulse/cycles: 2500 ( then 300 directly over LE) Head size: Regular     Patient tolerated procedure well without immediate complications.        Clinical History: No specialty comments available.  She reports that she has never smoked. She has never used smokeless tobacco. No results for input(s): "HGBA1C", "LABURIC" in the last 8760 hours.  Objective:   Vital Signs: There were no vitals taken for this visit.  Physical Exam  Gen: Well-appearing, in no acute distress; non-toxic CV: Well-perfused. Warm.  Resp: Breathing unlabored on room air; no wheezing. Psych: Fluid speech in conversation; appropriate affect; normal thought process Neuro: Sensation intact throughout. No gross coordination deficits.   Ortho Exam - Left elbow: There is some tenderness just distal to the lateral epicondyle  and over the common extensor tendon origin.  Full range of motion of the elbow.  No swelling or redness.  Mild pain with resisted wrist pronation/supination, negative Cozen's test today.  Imaging: No results found.  Past Medical/Family/Surgical/Social History: Medications & Allergies reviewed per EMR, new medications updated. Patient Active Problem  List   Diagnosis Date Noted   Genetic testing 07/25/2022   Family history of breast cancer 07/15/2022   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 (HCC) 05/12/2022   Chest tightness 03/04/2022   Family history of coronary artery disease in mother 03/04/2022   Hyperlipidemia 10/17/2021   Encounter to establish care 10/17/2021   Biceps tendinitis, left 10/17/2021   Mild intermittent asthma without complication 01/14/2021   Seasonal and perennial allergic rhinitis 01/14/2021   Dizziness 04/23/2020   Abnormal auditory perception of both ears 04/23/2020   Heart murmur 03/17/2018   Asthma 03/17/2018   Shortness of breath 09/25/2010   Palpitation    MVP (mitral valve prolapse)    Scoliosis    Past Medical History:  Diagnosis Date   Arthritis    Asthma    Breast cancer (HCC) 04/24/2022   MVP (mitral valve prolapse)    Although no evidence per echo in 2009. Trace MR   Palpitation    Scoliosis    Urticaria    Vaginal Pap smear, abnormal    Family History  Problem Relation Age of Onset   Heart disease Mother    Heart attack Mother    Breast cancer Mother 17   Urticaria Mother    Renal cancer Father 71       metastatic   Breast cancer Maternal Aunt 57   Breast cancer Maternal Aunt 83   Breast cancer Paternal Grandmother 52   Lung cancer Paternal Grandfather        smoked   Emphysema Paternal Grandfather    Past Surgical History:  Procedure Laterality Date   BREAST BIOPSY Left 04/24/2022   [place] LT BREAST BX W LOC DEV 1ST LESION IMG BX SPEC US GUIDE 04/24/2022 [facility]   BREAST BIOPSY Left 04/24/2022   [place] LT BREAST BX W LOC DEV EA ADD LESION IMG BX SPEC US GUIDE 04/24/2022 [facility]   BREAST BIOPSY Left 06/04/2022   [place] LT RADIOACTIVE SEED LOC 06/04/2022 [facility]   BREAST BIOPSY Left 06/04/2022   [place] LT RADIOACTIVE SEED EA ADD LESION 06/04/2022 [facility]   BREAST LUMPECTOMY WITH RADIOACTIVE SEED AND SENTINEL LYMPH NODE BIOPSY Left 06/06/2022    Procedure: LEFT BREAST LUMPECTOMY WITH RAADIOACTIVE SEED AND SENTINEL LYMPH NODE BIOPSY;  Surgeon: Griselda Miner, MD;  Location: [facility];  Service: General;  Laterality: Left;   KIDNEY STONE SURGERY Bilateral 2000   RADIOACTIVE SEED GUIDED AXILLARY SENTINEL LYMPH NODE Left 06/06/2022   Procedure: RADIOACTIVE SEED GUIDED LEFT AXILLARY SENTINEL LYMPH NODE DISSECTION;  Surgeon: Griselda Miner, MD;  Location: [facility];  Service: General;  Laterality: Left;   WISDOM TOOTH EXTRACTION Bilateral 1991   Social History   Occupational History   Not on file  Tobacco Use   Smoking status: Never   Smokeless tobacco: Never  Vaping Use   Vaping status: Never Used  Substance and Sexual Activity   Alcohol use: Yes    Comment: occ   Drug use: No   Sexual activity: Yes    Birth control/protection: None

## 2022-10-13 NOTE — Progress Notes (Signed)
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 She says that she did wear her brace while doing this but her elbow was flared up Sunday. She says that she used [employer], massaged it, and took Aleve. She says that she feels better today than yesterday but it is still sore.

## 2022-10-15 DIAGNOSIS — M25522 Pain in left elbow: Secondary | ICD-10-CM | POA: Diagnosis not present

## 2022-10-15 DIAGNOSIS — M25532 Pain in left wrist: Secondary | ICD-10-CM | POA: Diagnosis not present

## 2022-10-15 DIAGNOSIS — M7712 Lateral epicondylitis, left elbow: Secondary | ICD-10-CM | POA: Diagnosis not present

## 2022-10-15 DIAGNOSIS — M25632 Stiffness of left wrist, not elsewhere classified: Secondary | ICD-10-CM | POA: Diagnosis not present

## 2022-10-16 DIAGNOSIS — M25632 Stiffness of left wrist, not elsewhere classified: Secondary | ICD-10-CM | POA: Diagnosis not present

## 2022-10-16 DIAGNOSIS — M25522 Pain in left elbow: Secondary | ICD-10-CM | POA: Diagnosis not present

## 2022-10-16 DIAGNOSIS — M25532 Pain in left wrist: Secondary | ICD-10-CM | POA: Diagnosis not present

## 2022-10-16 DIAGNOSIS — M7712 Lateral epicondylitis, left elbow: Secondary | ICD-10-CM | POA: Diagnosis not present

## 2022-10-21 DIAGNOSIS — M7712 Lateral epicondylitis, left elbow: Secondary | ICD-10-CM | POA: Diagnosis not present

## 2022-10-21 DIAGNOSIS — M25532 Pain in left wrist: Secondary | ICD-10-CM | POA: Diagnosis not present

## 2022-10-21 DIAGNOSIS — M25632 Stiffness of left wrist, not elsewhere classified: Secondary | ICD-10-CM | POA: Diagnosis not present

## 2022-10-21 DIAGNOSIS — M25522 Pain in left elbow: Secondary | ICD-10-CM | POA: Diagnosis not present

## 2022-10-23 ENCOUNTER — Telehealth: Payer: Self-pay

## 2022-10-23 ENCOUNTER — Encounter: Payer: Self-pay | Admitting: Sports Medicine

## 2022-10-23 ENCOUNTER — Ambulatory Visit: Payer: BC Managed Care – PPO | Admitting: Sports Medicine

## 2022-10-23 DIAGNOSIS — M25512 Pain in left shoulder: Secondary | ICD-10-CM

## 2022-10-23 DIAGNOSIS — M7712 Lateral epicondylitis, left elbow: Secondary | ICD-10-CM

## 2022-10-23 DIAGNOSIS — M7502 Adhesive capsulitis of left shoulder: Secondary | ICD-10-CM | POA: Diagnosis not present

## 2022-10-23 DIAGNOSIS — G8929 Other chronic pain: Secondary | ICD-10-CM

## 2022-10-23 NOTE — Progress Notes (Signed)
Patient says she is feeling better; she says she does not feel completely better but is almost there. She says when she works it becomes achy, but that she stretches and does exercises and that alleviates the ache. She says she has used a heating pad at night everyday over the last week which has helped.

## 2022-10-23 NOTE — Telephone Encounter (Signed)
Triage message received patient wishes to schedule new patient appointment. I called patient to schedule. No answer. Left message to call office to schedule.

## 2022-10-23 NOTE — Progress Notes (Signed)
Mariah Burnett - 47 y.o. female MRN [id number]  Date of birth: [birth year]  Office Visit Note: Visit Date: 10/23/2022 PCP: Billie Lade, MD Referred by: Billie Lade, MD  Subjective: Chief Complaint  Patient presents with   Left Elbow - Follow-up   HPI: Mariah Burnett is a pleasant 47 y.o. female who presents today for follow-up of left elbow epicondylopathy, frozen shoulder of the left shoulder.  L-shoulder -this is feeling very well, only minimal pain.  She has worked very hard in both formal therapy and HEP to improve her range of motion.  Feels like she has great range of motion except external rotation she is still directed.  L-elbow -feeling between 90 and 100% improved.  She has done 5 sessions of extracorporeal shockwave therapy and continuing formal PT and HEP.  Taking meloxicam 15 mg only as needed unless she is active or has worsening pain.  Not requiring consistently at all.  Pertinent ROS were reviewed with the patient and found to be negative unless otherwise specified above in HPI.   Assessment & Plan: Visit Diagnoses:  1. Lateral epicondylitis of left elbow   2. Adhesive capsulitis of left shoulder   3. Chronic left shoulder pain    Plan: [name] has made excellent relief with her lateral epicondylopathy with extracorporeal shockwave therapy and physical therapy, we did repeat our last ESWT today.  She will transition from formal PT to home therapy and continue both her stretching and rehab exercises.  She may continue to use meloxicam 15 mg once daily only as needed for more significant pain, does not need to take consistently.  In terms of her chronic left shoulder pain with a history of adhesive capsulitis, she has made significant improvements but still has some restriction with external rotation.  She will continue working on stretching out the capsule.  In about 6 weeks I would like to see what sort of progress she makes from both of these.  We did discuss 1  additional higher volume glenohumeral joint injection to help get her over the top if she does not make this on her own with PT/HEP.  She will notify me in about 6 weeks how she is doing both.  Follow-up: Return in about 6 weeks (around 12/04/2022), or if symptoms worsen or fail to improve.   Meds & Orders: No orders of the defined types were placed in this encounter.  No orders of the defined types were placed in this encounter.    Procedures: Procedure: ECSWT Indications:  Left elbow lateral epicondylitis   Procedure Details Consent: Risks of procedure as well as the alternatives and risks of each were explained to the patient.  Verbal consent for procedure obtained. Time Out: Verified patient identification, verified procedure, site was marked, verified correct patient position. The area was cleaned with alcohol swab.     The left lateral epicondyle and common extensor tendon was targeted for Extracorporeal shockwave therapy.    Preset: Lateral epicondylitis Power Level: 110 mJ Frequency: 12 Hz Impulse/cycles: 2700 (then 300 directly over LE) Head size: Regular     Patient tolerated procedure well without immediate complications.       Clinical History: No specialty comments available.  She reports that she has never smoked. She has never used smokeless tobacco. No results for input(s): "HGBA1C", "LABURIC" in the last 8760 hours.  Objective:    Physical Exam  Gen: Well-appearing, in no acute distress; non-toxic CV:  Well-perfused. Warm.  Resp: Breathing unlabored on  room air; no wheezing. Psych: Fluid speech in conversation; appropriate affect; normal thought process Neuro: Sensation intact throughout. No gross coordination deficits.   Ortho Exam - Left elbow: + TTP over the lateral epicondyle, no pain palpating down the common extensor tendons.  Full range of motion about the elbow.  No redness or swelling.  - Left shoulder: No bony TTP.  There is full active and  passive range of motion in flexion, abduction and internal rotation.  Thumb to L2 compared to L2 with the contralateral arm.  There is still both passive and active restriction and external range of motion of about 55 degrees compared to 65 degrees of the contralateral arm.  Imaging: No results found.  Past Medical/Family/Surgical/Social History: Medications & Allergies reviewed per EMR, new medications updated. Patient Active Problem List   Diagnosis Date Noted   Genetic testing 07/25/2022   Family history of breast cancer 07/15/2022   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 (HCC) 05/12/2022   Chest tightness 03/04/2022   Family history of coronary artery disease in mother 03/04/2022   Hyperlipidemia 10/17/2021   Encounter to establish care 10/17/2021   Biceps tendinitis, left 10/17/2021   Mild intermittent asthma without complication 01/14/2021   Seasonal and perennial allergic rhinitis 01/14/2021   Dizziness 04/23/2020   Abnormal auditory perception of both ears 04/23/2020   Heart murmur 03/17/2018   Asthma 03/17/2018   Shortness of breath 09/25/2010   Palpitation    MVP (mitral valve prolapse)    Scoliosis    Past Medical History:  Diagnosis Date   Arthritis    Asthma    Breast cancer (HCC) 04/24/2022   MVP (mitral valve prolapse)    Although no evidence per echo in 2009. Trace MR   Palpitation    Scoliosis    Urticaria    Vaginal Pap smear, abnormal    Family History  Problem Relation Age of Onset   Heart disease Mother    Heart attack Mother    Breast cancer Mother 64   Urticaria Mother    Renal cancer Father 39       metastatic   Breast cancer Maternal Aunt 80   Breast cancer Maternal Aunt 53   Breast cancer Paternal Grandmother 44   Lung cancer Paternal Grandfather        smoked   Emphysema Paternal Grandfather    Past Surgical History:  Procedure Laterality Date   BREAST BIOPSY Left 04/24/2022   [place] LT BREAST BX W LOC DEV 1ST LESION  IMG BX SPEC US GUIDE 04/24/2022 [facility]   BREAST BIOPSY Left 04/24/2022   [place] LT BREAST BX W LOC DEV EA ADD LESION IMG BX SPEC US GUIDE 04/24/2022 [facility]   BREAST BIOPSY Left 06/04/2022   [place] LT RADIOACTIVE SEED LOC 06/04/2022 [facility]   BREAST BIOPSY Left 06/04/2022   [place] LT RADIOACTIVE SEED EA ADD LESION 06/04/2022 [facility]   BREAST LUMPECTOMY WITH RADIOACTIVE SEED AND SENTINEL LYMPH NODE BIOPSY Left 06/06/2022   Procedure: LEFT BREAST LUMPECTOMY WITH RAADIOACTIVE SEED AND SENTINEL LYMPH NODE BIOPSY;  Surgeon: Griselda Miner, MD;  Location: [facility];  Service: General;  Laterality: Left;   KIDNEY STONE SURGERY Bilateral 2000   RADIOACTIVE SEED GUIDED AXILLARY SENTINEL LYMPH NODE Left 06/06/2022   Procedure: RADIOACTIVE SEED GUIDED LEFT AXILLARY SENTINEL LYMPH NODE DISSECTION;  Surgeon: Griselda Miner, MD;  Location: [facility];  Service: General;  Laterality: Left;   WISDOM TOOTH EXTRACTION Bilateral  1991   Social History   Occupational History   Not on file  Tobacco Use   Smoking status: Never   Smokeless tobacco: Never  Vaping Use   Vaping status: Never Used  Substance and Sexual Activity   Alcohol use: Yes    Comment: occ   Drug use: No   Sexual activity: Yes    Birth control/protection: None

## 2022-10-28 ENCOUNTER — Other Ambulatory Visit: Payer: Self-pay | Admitting: *Deleted

## 2022-10-28 DIAGNOSIS — M25632 Stiffness of left wrist, not elsewhere classified: Secondary | ICD-10-CM | POA: Diagnosis not present

## 2022-10-28 DIAGNOSIS — M7712 Lateral epicondylitis, left elbow: Secondary | ICD-10-CM | POA: Diagnosis not present

## 2022-10-28 DIAGNOSIS — M25532 Pain in left wrist: Secondary | ICD-10-CM | POA: Diagnosis not present

## 2022-10-28 DIAGNOSIS — Z170 Estrogen receptor positive status [ER+]: Secondary | ICD-10-CM

## 2022-10-28 DIAGNOSIS — M25522 Pain in left elbow: Secondary | ICD-10-CM | POA: Diagnosis not present

## 2022-10-30 ENCOUNTER — Other Ambulatory Visit: Payer: Self-pay | Admitting: Sports Medicine

## 2022-10-30 ENCOUNTER — Other Ambulatory Visit: Payer: Self-pay | Admitting: *Deleted

## 2022-10-30 ENCOUNTER — Inpatient Hospital Stay: Payer: BC Managed Care – PPO | Attending: Hematology and Oncology

## 2022-10-30 ENCOUNTER — Inpatient Hospital Stay: Payer: BC Managed Care – PPO

## 2022-10-30 VITALS — BP 124/86 | HR 93 | Temp 98.4°F | Resp 18

## 2022-10-30 DIAGNOSIS — C50512 Malignant neoplasm of lower-outer quadrant of left female breast: Secondary | ICD-10-CM | POA: Insufficient documentation

## 2022-10-30 DIAGNOSIS — Z5111 Encounter for antineoplastic chemotherapy: Secondary | ICD-10-CM | POA: Insufficient documentation

## 2022-10-30 MED ORDER — VENLAFAXINE HCL ER 37.5 MG PO CP24: 37.5000 mg | ORAL_CAPSULE | Freq: Every day | ORAL | 0 refills | Status: DC

## 2022-10-30 MED ORDER — GOSERELIN ACETATE 3.6 MG ~~LOC~~ IMPL
3.6000 mg | DRUG_IMPLANT | Freq: Once | SUBCUTANEOUS | Status: AC
  Administered 2022-10-30: 3.6 mg via SUBCUTANEOUS
  Filled 2022-10-30: qty 3.6

## 2022-10-30 NOTE — Progress Notes (Signed)
Received call from pt with complaint of ongoing hot flashes as well as mood swings. Pt denies any suicidal thoughts. Per NP pt needing to start out on Effexor 37.5 mg p.o daily and discuss at survivorship appt the need to increase if needed.  Pt educated and verbalized understanding.  Prescription sent to pharmacy on file.

## 2022-11-12 DIAGNOSIS — C50512 Malignant neoplasm of lower-outer quadrant of left female breast: Secondary | ICD-10-CM | POA: Diagnosis not present

## 2022-11-12 DIAGNOSIS — Z170 Estrogen receptor positive status [ER+]: Secondary | ICD-10-CM | POA: Diagnosis not present

## 2022-11-22 ENCOUNTER — Other Ambulatory Visit: Payer: Self-pay | Admitting: Adult Health

## 2022-11-23 ENCOUNTER — Encounter: Payer: Self-pay | Admitting: Hematology and Oncology

## 2022-11-26 ENCOUNTER — Encounter: Payer: Self-pay | Admitting: Adult Health

## 2022-11-26 ENCOUNTER — Inpatient Hospital Stay: Payer: BC Managed Care – PPO | Attending: Hematology and Oncology

## 2022-11-26 ENCOUNTER — Inpatient Hospital Stay: Payer: BC Managed Care – PPO

## 2022-11-26 ENCOUNTER — Inpatient Hospital Stay (HOSPITAL_BASED_OUTPATIENT_CLINIC_OR_DEPARTMENT_OTHER): Payer: BC Managed Care – PPO | Admitting: Adult Health

## 2022-11-26 VITALS — BP 115/83 | HR 90 | Temp 98.3°F | Resp 18 | Wt 160.4 lb

## 2022-11-26 DIAGNOSIS — Z170 Estrogen receptor positive status [ER+]: Secondary | ICD-10-CM

## 2022-11-26 DIAGNOSIS — Z5111 Encounter for antineoplastic chemotherapy: Secondary | ICD-10-CM | POA: Diagnosis not present

## 2022-11-26 DIAGNOSIS — C50512 Malignant neoplasm of lower-outer quadrant of left female breast: Secondary | ICD-10-CM | POA: Diagnosis not present

## 2022-11-26 DIAGNOSIS — Z79811 Long term (current) use of aromatase inhibitors: Secondary | ICD-10-CM | POA: Insufficient documentation

## 2022-11-26 DIAGNOSIS — Z9189 Other specified personal risk factors, not elsewhere classified: Secondary | ICD-10-CM | POA: Diagnosis not present

## 2022-11-26 LAB — CBC WITH DIFFERENTIAL (CANCER CENTER ONLY)
Abs Immature Granulocytes: 0.01 10*3/uL (ref 0.00–0.07)
Basophils Absolute: 0 10*3/uL (ref 0.0–0.1)
Basophils Relative: 1 %
Eosinophils Absolute: 0.1 10*3/uL (ref 0.0–0.5)
Eosinophils Relative: 2 %
HCT: 38.4 % (ref 36.0–46.0)
Hemoglobin: 13.2 g/dL (ref 12.0–15.0)
Immature Granulocytes: 0 %
Lymphocytes Relative: 26 %
Lymphs Abs: 0.9 10*3/uL (ref 0.7–4.0)
MCH: 32.4 pg (ref 26.0–34.0)
MCHC: 34.4 g/dL (ref 30.0–36.0)
MCV: 94.3 fL (ref 80.0–100.0)
Monocytes Absolute: 0.4 10*3/uL (ref 0.1–1.0)
Monocytes Relative: 12 %
Neutro Abs: 2 10*3/uL (ref 1.7–7.7)
Neutrophils Relative %: 59 %
Platelet Count: 220 10*3/uL (ref 150–400)
RBC: 4.07 MIL/uL (ref 3.87–5.11)
RDW: 11.5 % (ref 11.5–15.5)
WBC Count: 3.3 10*3/uL — ABNORMAL LOW (ref 4.0–10.5)
nRBC: 0 % (ref 0.0–0.2)

## 2022-11-26 LAB — CMP (CANCER CENTER ONLY)
ALT: 13 U/L (ref 0–44)
AST: 16 U/L (ref 15–41)
Albumin: 4.5 g/dL (ref 3.5–5.0)
Alkaline Phosphatase: 49 U/L (ref 38–126)
Anion gap: 6 (ref 5–15)
BUN: 11 mg/dL (ref 6–20)
CO2: 27 mmol/L (ref 22–32)
Calcium: 9.8 mg/dL (ref 8.9–10.3)
Chloride: 104 mmol/L (ref 98–111)
Creatinine: 0.84 mg/dL (ref 0.44–1.00)
GFR, Estimated: >60 mL/min (ref >60)
Glucose, Bld: 89 mg/dL (ref 70–99)
Potassium: 3.9 mmol/L (ref 3.5–5.1)
Sodium: 137 mmol/L (ref 135–145)
Total Bilirubin: 0.7 mg/dL (ref <1.2)
Total Protein: 7.4 g/dL (ref 6.5–8.1)

## 2022-11-26 MED ORDER — GOSERELIN ACETATE 3.6 MG ~~LOC~~ IMPL
3.6000 mg | DRUG_IMPLANT | Freq: Once | SUBCUTANEOUS | Status: AC
  Administered 2022-11-26: 3.6 mg via SUBCUTANEOUS
  Filled 2022-11-26: qty 3.6

## 2022-11-26 MED ORDER — ACYCLOVIR 200 MG PO CAPS: 400.0000 mg | ORAL_CAPSULE | Freq: Three times a day (TID) | ORAL | 3 refills | Status: AC

## 2022-11-26 NOTE — Progress Notes (Unsigned)
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 (HCC)  04/24/2022 Initial Diagnosis   Left breast mass at 4 o'clock position 1.8 cm, 1 abnormal lymph node (positive), MRI revealed 2.3 cm mass: Biopsy: Grade 2 IDC with papillary features ER 80%, PR 100%, Ki-67 10%, HER2 0 negative   05/12/2022 Cancer Staging   Staging form: Breast, AJCC 8th Edition - Clinical stage from 05/12/2022: Stage IIA (cT2, cN1(f), cM0, G2, ER+, PR+, HER2-) - Signed by Ronny Bacon, PA-C on 05/14/2022 Stage prefix: Initial diagnosis Method of lymph node assessment: Core biopsy Histologic grading system: 3 grade system   06/06/2022 Surgery   Left breast lumpectomy: IDC, 1.6 cm, grade 2, margins negative, 1/4 LN + carcinoma.    06/06/2022 Oncotype testing   3/2%   06/06/2022 Cancer Staging   Staging form: Breast, AJCC 8th Edition - Pathologic stage from 06/06/2022: Stage IA (pT1c, pN1a, cM0, G2, ER+, PR+, HER2-, Oncotype DX score: 3) - Signed by Loa Socks, NP on 06/19/2022 Stage prefix: Initial diagnosis Multigene prognostic tests performed: Oncotype DX Recurrence score range: Less than 11 Histologic grading system: 3 grade system    Genetic Testing   [facility] CancerNext+RenalNext Panel+RNA was Negative. Report date is 07/23/2022.  The CancerNext+RenalNext gene panel offered by [employer] includes sequencing, rearrangement analysis, and RNA analysis for the following 46 genes:   APC, ATM, AXIN2, BAP1, BARD1, BMPR1A, BRCA1, BRCA2, BRIP1, CDH1, CDK4, CDKN2A, CHEK2, DICER1, FH, FLCN, HOXB13, EPCAM, GREM1, MET, MITF, MLH1, MSH2, MSH3, MSH6, MUTYH, NF1, NTHL1, PALB2, PMS2, POLD1, POLE, PTEN, RAD51C, RAD51D, SDHA, SDHB, SDHC, SDHD, SMAD4, SMARCA4, STK11, TP53, TSC1, TSC2, and VHL.     07/23/2022 - 08/21/2022 Radiation Therapy   Left breast and lymph nodes: 4272 cGy in 16 treatments "Boost": 1000 cGy in 4 treatments    09/2022 -   Anti-estrogen oral therapy   Anastrozole     INTERVAL HISTORY:    Discussed the use of AI scribe software for clinical note transcription with the patient, who gave verbal consent to proceed.  The patient, with a history of cancer, presents for a follow-up consultation. They report starting therapy and taking Effexor, which has improved their sleep quality, previously disrupted by hot flashes. The patient is currently on 37.5 mg of Effexor and reports a significant reduction in hot flashes. However, they note an increase in joint popping and cracking, which they attribute to their current medication regimen, but report no associated pain.  The patient has also been receiving Zoladex injections, which they tolerate well. They express interest in supplementing their treatment with curcumin, but are unsure about the appropriate dosage. They have been sourcing their supplements from a local health store run by a nutritionist, ensuring the products are natural and from reliable sources.  In addition to pharmacological interventions, the patient has made significant lifestyle changes, including adopting a healthier diet, cutting out processed foods, and increasing their intake of fruits and vegetables. They express concern about the sugar content in fruit and its potential impact on their health, but are reassured that natural sugars are metabolized differently by the body.  The patient also reports taking Atarax for anxiety, but notes that their anxiety has improved since starting Effexor.   The patient has experienced hair thinning, which they attribute to their cancer treatment. They have been implementing measures to minimize hair breakage, such as using high-quality shampoo and sleeping on a satin or silk pillowcase.   REVIEW  OF SYSTEMS:  Review of Systems  Constitutional:  Negative for appetite change, chills, fatigue, fever and unexpected weight change.  HENT:   Negative for hearing loss,  lump/mass and trouble swallowing.   Eyes:  Negative for eye problems and icterus.  Respiratory:  Negative for chest tightness, cough and shortness of breath.   Cardiovascular:  Negative for chest pain, leg swelling and palpitations.  Gastrointestinal:  Negative for abdominal distention, abdominal pain, constipation, diarrhea, nausea and vomiting.  Endocrine: Negative for hot flashes.  Genitourinary:  Negative for difficulty urinating.   Musculoskeletal:  Positive for arthralgias.  Skin:  Negative for itching and rash.  Neurological:  Negative for dizziness, extremity weakness, headaches and numbness.  Hematological:  Negative for adenopathy. Does not bruise/bleed easily.  Psychiatric/Behavioral:  Negative for depression. The patient is not nervous/anxious.    Breast: Denies any new nodularity, masses, tenderness, nipple changes, or nipple discharge.       PAST MEDICAL/SURGICAL HISTORY:  Past Medical History:  Diagnosis Date   Arthritis    Asthma    Breast cancer (HCC) 04/24/2022   MVP (mitral valve prolapse)    Although no evidence per echo in 2009. Trace MR   Palpitation    Scoliosis    Urticaria    Vaginal Pap smear, abnormal    Past Surgical History:  Procedure Laterality Date   BREAST BIOPSY Left 04/24/2022   [place] LT BREAST BX W LOC DEV 1ST LESION IMG BX SPEC US GUIDE 04/24/2022 [facility]   BREAST BIOPSY Left 04/24/2022   [place] LT BREAST BX W LOC DEV EA ADD LESION IMG BX SPEC US GUIDE 04/24/2022 [facility]ASOUND   BREAST BIOPSY Left 06/04/2022   [place] LT RADIOACTIVE SEED LOC 06/04/2022 [facility]MO[facility]   BREAST BIOPSY Left 06/04/2022   [place] LT RADIOACTIVE SEED EA ADD LESION 06/04/2022 [facility]AMMO[facility]   BREAST LUMPECTOMY WITH RADIOACTIVE SEED AND SENTINEL LYMPH NODE BIOPSY Left 06/06/2022   Procedure: LEFT BREAST LUMPECTOMY WITH RAADIOACTIVE SEED AND SENTINEL LYMPH NODE BIOPSY;  Surgeon: Griselda Miner, MD;  Location: [facility];  Service: General;  Laterality: Left;    KIDNEY STONE SURGERY Bilateral 2000   RADIOACTIVE SEED GUIDED AXILLARY SENTINEL LYMPH NODE Left 06/06/2022   Procedure: RADIOACTIVE SEED GUIDED LEFT AXILLARY SENTINEL LYMPH NODE DISSECTION;  Surgeon: Griselda Miner, MD;  Location: [facility];  Service: General;  Laterality: Left;   WISDOM TOOTH EXTRACTION Bilateral 1991     ALLERGIES:  Allergies  Allergen Reactions   Latex Rash     CURRENT MEDICATIONS:  Outpatient Encounter Medications as of 11/26/2022  Medication Sig   acyclovir (ZOVIRAX) 200 MG capsule Take 400 mg by mouth 3 (three) times daily.   anastrozole (ARIMIDEX) 1 MG tablet Take 1 tablet (1 mg total) by mouth daily.   desloratadine (CLARINEX) 5 MG tablet TAKE 1 TABLET DAILY AS     NEEDED FOR RUNNY NOSE OR   ITCHY EYES   hydrOXYzine (ATARAX) 25 MG tablet Take 25 mg by mouth every 8 (eight) hours as needed.   meloxicam (MOBIC) 15 MG tablet Take 1 tablet (15 mg total) by mouth daily as needed.   metoprolol tartrate (LOPRESSOR) 25 MG tablet TAKE 1/2 TABLET TWICE A DAY   venlafaxine XR (EFFEXOR-XR) 37.5 MG 24 hr capsule TAKE 1 CAPSULE BY MOUTH DAILY WITH BREAKFAST.   No facility-administered encounter medications on file as of 11/26/2022.     ONCOLOGIC FAMILY HISTORY:  Family History  Problem Relation Age of Onset  Heart disease Mother    Heart attack Mother    Breast cancer Mother 69   Urticaria Mother    Renal cancer Father 76       metastatic   Breast cancer Maternal Aunt 44   Breast cancer Maternal Aunt 86   Breast cancer Paternal Grandmother 66   Lung cancer Paternal Grandfather        smoked   Emphysema Paternal Grandfather      SOCIAL HISTORY:  Social History   Socioeconomic History   Marital status: Divorced    Spouse name: Not on file   Number of children: Not on file   Years of education: Not on file   Highest education level: Not on file  Occupational History   Not on file  Tobacco Use   Smoking status: Never   Smokeless  tobacco: Never  Vaping Use   Vaping status: Never Used  Substance and Sexual Activity   Alcohol use: Yes    Comment: occ   Drug use: No   Sexual activity: Yes    Birth control/protection: None  Other Topics Concern   Not on file  Social History Narrative   Not on file   Social Determinants of Health   Financial Resource Strain: Low Risk  (03/05/2021)   Overall Financial Resource Strain (CARDIA)    Difficulty of Paying Living Expenses: Not very hard  Food Insecurity: Food Insecurity Present (03/05/2021)   Hunger Vital Sign    Worried About Running Out of Food in the Last Year: Sometimes true    Ran Out of Food in the Last Year: Sometimes true  Transportation Needs: No Transportation Needs (03/05/2021)   PRAPARE - Administrator, Civil Service (Medical): No    Lack of Transportation (Non-Medical): No  Physical Activity: Sufficiently Active (03/05/2021)   Exercise Vital Sign    Days of Exercise per Week: 7 days    Minutes of Exercise per Session: 60 min  Stress: No Stress Concern Present (03/05/2021)   [employer] of Occupational Health - Occupational Stress Questionnaire    Feeling of Stress : Not at all  Social Connections: Moderately Isolated (03/05/2021)   Social Connection and Isolation Panel [NHANES]    Frequency of Communication with Friends and Family: Once a week    Frequency of Social Gatherings with Friends and Family: Once a week    Attends Religious Services: More than 4 times per year    Active Member of [employer] or Organizations: Yes    Attends Banker Meetings: More than 4 times per year    Marital Status: Separated  Intimate Partner Violence: Not At Risk (03/05/2021)   Humiliation, Afraid, Rape, and Kick questionnaire    Fear of Current or Ex-Partner: No    Emotionally Abused: No    Physically Abused: No    Sexually Abused: No     OBSERVATIONS/OBJECTIVE:  BP 115/83 (BP Location: Left Arm, Patient Position: Sitting)   Pulse 90    Temp 98.3 F (36.8 C) (Temporal)   Resp 18   Wt 160 lb 6.4 oz (72.8 kg)   SpO2 99%   BMI 25.12 kg/m  GENERAL: Patient is a well appearing female in no acute distress HEENT:  Sclerae anicteric.  Oropharynx clear and moist. No ulcerations or evidence of oropharyngeal candidiasis. Neck is supple.  NODES:  No cervical, supraclavicular, or axillary lymphadenopathy palpated.  BREAST EXAM:  left breast s/p lumpectomy and radiation, no sign of local recurrence, right breast is  benign. LUNGS:  Clear to auscultation bilaterally.  No wheezes or rhonchi. HEART:  Regular rate and rhythm. No murmur appreciated. ABDOMEN:  Soft, nontender.  Positive, normoactive bowel sounds. No organomegaly palpated. MSK:  No focal spinal tenderness to palpation. Full range of motion bilaterally in the upper extremities. EXTREMITIES:  No peripheral edema.   SKIN:  Clear with no obvious rashes or skin changes. No nail dyscrasia. NEURO:  Nonfocal. Well oriented.  Appropriate affect.   LABORATORY DATA:  None for this visit.  DIAGNOSTIC IMAGING:  None for this visit.      ASSESSMENT AND PLAN:  Ms.. Bautz is a pleasant 47 y.o. female with Stage IA left breast invasive ductal carcinoma, ER+/PR+/HER2-, diagnosed in 05/2022, treated with lumpectomy, adjuvant radiation therapy, and anti-estrogen therapy with Zoladex and Anastrozole beginning in 09/2022.  She presents to the Survivorship Clinic for our initial meeting and routine follow-up post-completion of treatment for breast cancer.    1. Stage IA left breast cancer:  Ms. Yrigoyen is continuing to recover from definitive treatment for breast cancer. She will follow-up with her medical oncologist, Dr.  Pamelia Hoit in 12 weeks with history and physical exam per surveillance protocol.  She will continue her anti-estrogen therapy with Zoladex/Anastrozole.  Her mammogram is due 04/2023; orders placed today.  Since her cancer was mammographically occult, she will undergo MRI in 10/2023.   I also gave Kaiyana information on Guardant testing.    Today, a comprehensive survivorship care plan and treatment summary was reviewed with the patient today detailing her breast cancer diagnosis, treatment course, potential late/long-term effects of treatment, appropriate follow-up care with recommendations for the future, and patient education resources.  A copy of this summary, along with a letter will be sent to the patient's primary care provider via mail/fax/In Basket message after today's visit.    2. Bone health:  She was given education on specific activities to promote bone health.  Bone density testing ordered.    3. Cancer screening:  Due to Ms. Stonesifer's history and her age, she should receive screening for skin cancers, colon cancer, and gynecologic cancers.  The information and recommendations are listed on the patient's comprehensive care plan/treatment summary and were reviewed in detail with the patient.    4. Health maintenance and wellness promotion: Ms. Lor was encouraged to consume 5-7 servings of fruits and vegetables per day. We reviewed the ACLM recommendations for healthy living.  She was also encouraged to engage in moderate to vigorous exercise for 30 minutes per day most days of the week.  She was instructed to limit her alcohol consumption and continue to abstain from tobacco use.     5. Support services/counseling: It is not uncommon for this period of the patient's cancer care trajectory to be one of many emotions and stressors.   She was given information regarding our available services and encouraged to contact me with any questions or for help enrolling in any of our support group/programs.    Follow up instructions:    -Return to cancer center every 4 weeks for Zoladex and in 12 weeks for f/u with Dr. Pamelia Hoit  -Mammogram due in 04/2023 -Consider breast MRI 10/2023 -Baseline bone density testing -She is welcome to return back to the Survivorship Clinic at any  time; no additional follow-up needed at this time.  -Consider referral back to survivorship as a long-term survivor for continued surveillance  The patient was provided an opportunity to ask questions and all were answered. The patient agreed  with the plan and demonstrated an understanding of the instructions.   Total encounter time:45 minutes*in face-to-face visit time, chart review, lab review, care coordination, order entry, and documentation of the encounter time.    Lillard Anes, NP 11/26/22 10:05 AM Medical Oncology and Hematology [facility] [address] [place], [place] [address] Tel. [phone]    Fax. [phone]  *Total Encounter Time as defined by the Centers for Medicare and Medicaid Services includes, in addition to the face-to-face time of a patient visit (documented in the note above) non-face-to-face time: obtaining and reviewing outside history, ordering and reviewing medications, tests or procedures, care coordination (communications with other health care professionals or caregivers) and documentation in the medical record.

## 2022-11-27 ENCOUNTER — Encounter: Payer: Self-pay | Admitting: Hematology and Oncology

## 2022-12-11 ENCOUNTER — Telehealth: Payer: Self-pay

## 2022-12-11 ENCOUNTER — Ambulatory Visit (HOSPITAL_BASED_OUTPATIENT_CLINIC_OR_DEPARTMENT_OTHER)
Admission: RE | Admit: 2022-12-11 | Discharge: 2022-12-11 | Disposition: A | Discharge status: Home / Self Care | Payer: BC Managed Care – PPO | Source: Ambulatory Visit | Attending: Adult Health | Admitting: Adult Health

## 2022-12-11 DIAGNOSIS — M85851 Other specified disorders of bone density and structure, right thigh: Secondary | ICD-10-CM | POA: Insufficient documentation

## 2022-12-11 DIAGNOSIS — Z79811 Long term (current) use of aromatase inhibitors: Secondary | ICD-10-CM | POA: Diagnosis not present

## 2022-12-11 DIAGNOSIS — M859 Disorder of bone density and structure, unspecified: Secondary | ICD-10-CM | POA: Diagnosis not present

## 2022-12-11 NOTE — Telephone Encounter (Addendum)
Called Pt per message below. Lvm for pt and advise her to call back to speak with the nurse with any questions or concerns.----- Message from Noreene Filbert sent at 12/11/2022 12:49 PM EST ----- Patient bone density is consistent with osteopenia that is very early and mild.  Please let her know that we recommend repeat testing in 2 years time. ----- Message ----- From: Interface, Rad Results In Sent: 12/11/2022   8:46 AM EST To: Loa Socks, NP

## 2022-12-24 ENCOUNTER — Inpatient Hospital Stay: Payer: BC Managed Care – PPO | Attending: Hematology and Oncology

## 2022-12-24 VITALS — BP 104/76 | HR 90 | Temp 98.7°F | Resp 16

## 2022-12-24 DIAGNOSIS — Z5111 Encounter for antineoplastic chemotherapy: Secondary | ICD-10-CM | POA: Insufficient documentation

## 2022-12-24 DIAGNOSIS — Z170 Estrogen receptor positive status [ER+]: Secondary | ICD-10-CM

## 2022-12-24 DIAGNOSIS — C50512 Malignant neoplasm of lower-outer quadrant of left female breast: Secondary | ICD-10-CM | POA: Diagnosis not present

## 2022-12-24 MED ORDER — GOSERELIN ACETATE 3.6 MG ~~LOC~~ IMPL
3.6000 mg | DRUG_IMPLANT | Freq: Once | SUBCUTANEOUS | Status: AC
  Administered 2022-12-24: 3.6 mg via SUBCUTANEOUS
  Filled 2022-12-24: qty 3.6

## 2022-12-24 NOTE — Patient Instructions (Signed)
Goserelin Implant What is this medication? GOSERELIN (GOE se rel in) treats prostate cancer and breast cancer. It works by decreasing levels of the hormones testosterone and estrogen in the body. This prevents prostate and breast cancer cells from spreading or growing. It may also be used to treat endometriosis. This is a condition where the tissue that lines the uterus grows outside the uterus. It works by decreasing the amount of estrogen your body makes, which reduces heavy bleeding and pain. It can also be used to help thin the lining of the uterus before a surgery used to prevent or reduce heavy periods. This medicine may be used for other purposes; ask your health care provider or pharmacist if you have questions. COMMON BRAND NAME(S): Zoladex, Zoladex 3-Month What should I tell my care team before I take this medication? They need to know if you have any of these conditions: Bone problems Diabetes Heart disease History of irregular heartbeat or rhythm An unusual or allergic reaction to goserelin, other medications, foods, dyes, or preservatives Pregnant or trying to get pregnant Breastfeeding How should I use this medication? This medication is injected under the skin. It is given by your care team in a hospital or clinic setting. Talk to your care team about the use of this medication in children. Special care may be needed. Overdosage: If you think you have taken too much of this medicine contact a poison control center or emergency room at once. NOTE: This medicine is only for you. Do not share this medicine with others. What if I miss a dose? Keep appointments for follow-up doses. It is important not to miss your dose. Call your care team if you are unable to keep an appointment. What may interact with this medication? Do not take this medication with any of the following: Cisapride Dronedarone Pimozide Thioridazine This medication may also interact with the following: Other  medications that cause heart rhythm changes This list may not describe all possible interactions. Give your health care provider a list of all the medicines, herbs, non-prescription drugs, or dietary supplements you use. Also tell them if you smoke, drink alcohol, or use illegal drugs. Some items may interact with your medicine. What should I watch for while using this medication? Visit your care team for regular checks on your progress. Your symptoms may appear to get worse during the first weeks of this therapy. Tell your care team if your symptoms do not start to get better or if they get worse after this time. Using this medication for a long time may weaken your bones. If you smoke or frequently drink alcohol you may increase your risk of bone loss. A family history of osteoporosis, chronic use of medications for seizures (convulsions), or corticosteroids can also increase your risk of bone loss. The risk of bone fractures may be increased. Talk to your care team about your bone health. This medication may increase blood sugar. The risk may be higher in patients who already have diabetes. Ask your care team what you can do to lower your risk of diabetes while taking this medication. This medication should stop regular monthly menstruation in women. Tell your care team if you continue to menstruate. Talk to your care team if you wish to become pregnant or think you might be pregnant. This medication can cause serious birth defects if taken during pregnancy or for 12 weeks after stopping treatment. Talk to your care team about reliable forms of contraception. Do not breastfeed while taking this   medication. This medication may cause infertility. Talk to your care team if you are concerned about your fertility. What side effects may I notice from receiving this medication? Side effects that you should report to your care team as soon as possible: Allergic reactions--skin rash, itching, hives, swelling  of the face, lips, tongue, or throat Change in the amount of urine Heart attack--pain or tightness in the chest, shoulders, arms, or jaw, nausea, shortness of breath, cold or clammy skin, feeling faint or lightheaded Heart rhythm changes--fast or irregular heartbeat, dizziness, feeling faint or lightheaded, chest pain, trouble breathing High blood sugar (hyperglycemia)--increased thirst or amount of urine, unusual weakness or fatigue, blurry vision High calcium level--increased thirst or amount of urine, nausea, vomiting, confusion, unusual weakness or fatigue, bone pain Pain, redness, irritation, or bruising at the injection site Severe back pain, numbness or weakness of the hands, arms, legs, or feet, loss of coordination, loss of bowel or bladder control Stroke--sudden numbness or weakness of the face, arm, or leg, trouble speaking, confusion, trouble walking, loss of balance or coordination, dizziness, severe headache, change in vision Swelling and pain of the tumor site or lymph nodes Trouble passing urine Side effects that usually do not require medical attention (report to your care team if they continue or are bothersome): Change in sex drive or performance Headache Hot flashes Rapid or extreme change in emotion or mood Sweating Swelling of the ankles, hands, or feet Unusual vaginal discharge, itching, or odor This list may not describe all possible side effects. Call your doctor for medical advice about side effects. You may report side effects to FDA at 1-800-FDA-1088. Where should I keep my medication? This medication is given in a hospital or clinic. It will not be stored at home. NOTE: This sheet is a summary. It may not cover all possible information. If you have questions about this medicine, talk to your doctor, pharmacist, or health care provider.  2024 Elsevier/Gold Standard (2021-05-16 00:00:00)  

## 2022-12-25 ENCOUNTER — Other Ambulatory Visit: Payer: Self-pay | Admitting: Internal Medicine

## 2022-12-25 ENCOUNTER — Encounter: Payer: Self-pay | Admitting: Internal Medicine

## 2022-12-25 ENCOUNTER — Ambulatory Visit (INDEPENDENT_AMBULATORY_CARE_PROVIDER_SITE_OTHER): Payer: BC Managed Care – PPO | Admitting: Internal Medicine

## 2022-12-25 VITALS — BP 106/70 | HR 92 | Temp 98.3°F | Ht 66.0 in | Wt 159.0 lb

## 2022-12-25 DIAGNOSIS — U071 COVID-19: Secondary | ICD-10-CM | POA: Insufficient documentation

## 2022-12-25 DIAGNOSIS — J4520 Mild intermittent asthma, uncomplicated: Secondary | ICD-10-CM | POA: Diagnosis not present

## 2022-12-25 DIAGNOSIS — B349 Viral infection, unspecified: Secondary | ICD-10-CM | POA: Diagnosis not present

## 2022-12-25 MED ORDER — NIRMATRELVIR/RITONAVIR (PAXLOVID)TABLET: 3.0000 | ORAL_TABLET | Freq: Two times a day (BID) | ORAL | 0 refills | Status: AC

## 2022-12-25 MED ORDER — ALBUTEROL SULFATE HFA 108 (90 BASE) MCG/ACT IN AERS: 2.0000 | INHALATION_SPRAY | Freq: Four times a day (QID) | RESPIRATORY_TRACT | 2 refills | Status: DC | PRN

## 2022-12-25 NOTE — Assessment & Plan Note (Addendum)
Presenting today for an acute visit endorsing a 1 day history of the symptoms noted above.  She took a home COVID-19 test yesterday that was positive however the kit was expired.  She requests to be retested again today.  Current symptoms and exam findings today are consistent with a viral respiratory infection, likely COVID-19 given positive home test. -COVID/flu/RSV testing ordered today -Given positive home test, I will prescribe Paxlovid. Meets criteria based on history of breast cancer.  -Recommend continuing current supportive care measures -She was instructed return to care if symptoms worsen or fail to improve.  Otherwise, we will arrange routine follow-up for 3 months.

## 2022-12-25 NOTE — Patient Instructions (Signed)
It was a pleasure to see you today.  Thank you for giving [place] the opportunity to be involved in your care.  Below is a brief recap of your visit and next steps.  We will plan to see you again in 3 months.  Summary Paxlovid prescribed today for covid-19 Albuterol refilled Continue current supportive care measures Follow up in 3 months

## 2022-12-25 NOTE — Progress Notes (Signed)
Acute Office Visit  Subjective:     Patient ID: Mariah Burnett, female    DOB: [birth year], 47 y.o.   MRN: [id number]  Chief Complaint  Patient presents with   Sore Throat    Patient complains of sore throat, lost voice, congestion, body aches, fever up to 100, all starting yesterday.    Mariah Burnett presents today for an acute visit endorsing a 1 day history of sore throat, cough, chest congestion, sinus congestion, and myalgias.  Symptoms began yesterday morning.  She took a home COVID-19 test yesterday that was positive but she reports that the test kit had expired.  She has been managing her symptoms with Tylenol, cough/cold medication, and ginger tea.  She reports a Tmax of 100.0 F.  Denies nausea/vomiting and diarrhea.  She is unaware of any recent sick contacts.  Review of Systems  Constitutional:  Positive for chills and malaise/fatigue.  HENT:  Positive for congestion, sinus pain and sore throat.   Respiratory:  Positive for cough and sputum production.   Gastrointestinal:  Negative for diarrhea, nausea and vomiting.  Musculoskeletal:  Positive for myalgias.      Objective:    BP 106/70   Pulse 92   Temp 98.3 F (36.8 C)   Ht 5\' 6"  (1.676 m)   Wt 159 lb (72.1 kg)   SpO2 98%   BMI 25.66 kg/m   Physical Exam Vitals reviewed.  Constitutional:      General: She is not in acute distress.    Appearance: Normal appearance. She is ill-appearing. She is not toxic-appearing.  HENT:     Head: Normocephalic and atraumatic.     Right Ear: External ear normal.     Left Ear: External ear normal.     Nose: Congestion and rhinorrhea present.     Mouth/Throat:     Mouth: Mucous membranes are moist.     Pharynx: Oropharynx is clear. No oropharyngeal exudate or posterior oropharyngeal erythema.  Eyes:     General: No scleral icterus.    Extraocular Movements: Extraocular movements intact.     Conjunctiva/sclera: Conjunctivae normal.     Pupils: Pupils are equal, round, and  reactive to light.  Cardiovascular:     Rate and Rhythm: Normal rate and regular rhythm.     Pulses: Normal pulses.     Heart sounds: Normal heart sounds. No murmur heard.    No friction rub. No gallop.  Pulmonary:     Effort: Pulmonary effort is normal.     Breath sounds: Normal breath sounds. No wheezing, rhonchi or rales.  Abdominal:     General: Abdomen is flat. Bowel sounds are normal. There is no distension.     Palpations: Abdomen is soft.     Tenderness: There is no abdominal tenderness.  Musculoskeletal:        General: No swelling. Normal range of motion.     Cervical back: Normal range of motion.     Right lower leg: No edema.     Left lower leg: No edema.  Lymphadenopathy:     Cervical: No cervical adenopathy.  Skin:    General: Skin is warm and dry.     Capillary Refill: Capillary refill takes less than 2 seconds.     Coloration: Skin is not jaundiced.  Neurological:     General: No focal deficit present.     Mental Status: She is alert and oriented to person, place, and time.  Psychiatric:  Mood and Affect: Mood normal.        Behavior: Behavior normal.       Assessment & Plan:   Problem List Items Addressed This Visit       COVID-19 - Primary   Presenting today for an acute visit endorsing a 1 day history of the symptoms noted above.  She took a home COVID-19 test yesterday that was positive however the kit was expired.  She requests to be retested again today.  Current symptoms and exam findings today are consistent with a viral respiratory infection, likely COVID-19 given positive home test. -COVID/flu/RSV testing ordered today -Given positive home test, I will prescribe Paxlovid. Meets criteria based on history of breast cancer.  -Recommend continuing current supportive care measures -She was instructed return to care if symptoms worsen or fail to improve.  Otherwise, we will arrange routine follow-up for 3 months.       Meds ordered this  encounter  Medications   nirmatrelvir/ritonavir (PAXLOVID) 20 x 150 MG & 10 x 100MG  TABS    Sig: Take 3 tablets by mouth 2 (two) times daily for 5 days. (Take nirmatrelvir 150 mg two tablets twice daily for 5 days and ritonavir 100 mg one tablet twice daily for 5 days) Patient GFR is > 60.    Dispense:  30 tablet    Refill:  0   albuterol (VENTOLIN HFA) 108 (90 Base) MCG/ACT inhaler    Sig: Inhale 2 puffs into the lungs every 6 (six) hours as needed for wheezing or shortness of breath.    Dispense:  8 g    Refill:  2    Return in about 3 months (around 03/25/2023).  Billie Lade, MD

## 2022-12-27 LAB — COVID-19, FLU A+B AND RSV
Influenza A, NAA: NOT DETECTED
Influenza B, NAA: NOT DETECTED
RSV, NAA: NOT DETECTED
SARS-CoV-2, NAA: DETECTED — AB

## 2022-12-29 ENCOUNTER — Ambulatory Visit: Payer: BC Managed Care – PPO | Attending: General Surgery

## 2022-12-29 VITALS — Wt 161.2 lb

## 2022-12-29 DIAGNOSIS — Z483 Aftercare following surgery for neoplasm: Secondary | ICD-10-CM | POA: Insufficient documentation

## 2022-12-29 NOTE — Therapy (Signed)
OUTPATIENT PHYSICAL THERAPY SOZO SCREENING NOTE   Patient Name: Mariah Burnett MRN: [id number] DOB:Sep 22, 1975, 47 y.o., female Today's Date: 12/29/2022  PCP: Billie Lade, MD REFERRING PROVIDER: Griselda Miner, MD   PT End of Session - 12/29/22 1624     Visit Number 15   # unchanged due to screen only   PT Start Time 1621    PT Stop Time 1626    PT Time Calculation (min) 5 min    Activity Tolerance Patient tolerated treatment well    Behavior During Therapy [facility] for tasks assessed/performed             Past Medical History:  Diagnosis Date   Arthritis    Asthma    Breast cancer (HCC) 04/24/2022   MVP (mitral valve prolapse)    Although no evidence per echo in 2009. Trace MR   Palpitation    Scoliosis    Urticaria    Vaginal Pap smear, abnormal    Past Surgical History:  Procedure Laterality Date   BREAST BIOPSY Left 04/24/2022   [place] LT BREAST BX W LOC DEV 1ST LESION IMG BX SPEC US GUIDE 04/24/2022 [facility]-ULTRASOUND   BREAST BIOPSY Left 04/24/2022   [place] LT BREAST BX W LOC DEV EA ADD LESION IMG BX SPEC US GUIDE 04/24/2022 [facility]-ULTRASOUND   BREAST BIOPSY Left 06/04/2022   [place] LT RADIOACTIVE SEED LOC 06/04/2022 [facility]MO[facility]AP[facility]   BREAST BIOPSY Left 06/04/2022   [place] LT RADIOACTIVE SEED EA ADD LESION 06/04/2022 [facility]AMMOGRAP[facility]   BREAST LUMPECTOMY WITH RADIOACTIVE SEED AND SENTINEL LYMPH NODE BIOPSY Left 06/06/2022   Procedure: LEFT BREAST LUMPECTOMY WITH RAADIOACTIVE SEED AND SENTINEL LYMPH NODE BIOPSY;  Surgeon: Griselda Miner, MD;  Location: [facility];  Service: General;  Laterality: Left;   KIDNEY STONE SURGERY Bilateral 2000   RADIOACTIVE SEED GUIDED AXILLARY SENTINEL LYMPH NODE Left 06/06/2022   Procedure: RADIOACTIVE SEED GUIDED LEFT AXILLARY SENTINEL LYMPH NODE DISSECTION;  Surgeon: Griselda Miner, MD;  Location: [facility];  Service: General;  Laterality: Left;   WISDOM TOOTH EXTRACTION Bilateral 1991   Patient Active Problem List    Diagnosis Date Noted   COVID-19 12/25/2022   Genetic testing 07/25/2022   Family history of breast cancer 07/15/2022   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 (HCC) 05/12/2022   Chest tightness 03/04/2022   Family history of coronary artery disease in mother 03/04/2022   Hyperlipidemia 10/17/2021   Encounter to establish care 10/17/2021   Biceps tendinitis, left 10/17/2021   Mild intermittent asthma without complication 01/14/2021   Seasonal and perennial allergic rhinitis 01/14/2021   Dizziness 04/23/2020   Abnormal auditory perception of both ears 04/23/2020   Heart murmur 03/17/2018   Asthma 03/17/2018   Shortness of breath 09/25/2010   Palpitation    MVP (mitral valve prolapse)    Scoliosis     REFERRING DIAG: left breast cancer at risk for lymphedema  THERAPY DIAG: Aftercare following surgery for neoplasm  PERTINENT HISTORY: Lt lumpectomy due to [facility] 06/06/22. 1/4+ nodes. Radiation planned.   PRECAUTIONS: left UE Lymphedema risk, None  SUBJECTIVE: Pt returns for her 3 month L-Dex screen.   PAIN:  Are you having pain? No  SOZO SCREENING: Patient was assessed today using the SOZO machine to determine the lymphedema index score. This was compared to her baseline score. It was determined that she is within the recommended range when compared to her baseline and no further action is needed at this  time. She will continue SOZO screenings. These are done every 3 months for 2 years post operatively followed by every 6 months for 2 years, and then annually.   L-DEX FLOWSHEETS - 12/29/22 1600       L-DEX LYMPHEDEMA SCREENING   Measurement Type Unilateral    L-DEX MEASUREMENT EXTREMITY Upper Extremity    POSITION  Standing    DOMINANT SIDE Right    At Risk Side Left    BASELINE SCORE (UNILATERAL) 0.6    L-DEX SCORE (UNILATERAL) 4.6    VALUE CHANGE (UNILAT) 4               Hermenia Bers, PTA 12/29/2022, 4:25 PM

## 2023-01-13 ENCOUNTER — Ambulatory Visit: Payer: BC Managed Care – PPO | Admitting: Urology

## 2023-01-21 ENCOUNTER — Inpatient Hospital Stay: Payer: BC Managed Care – PPO | Attending: Hematology and Oncology

## 2023-01-21 ENCOUNTER — Encounter: Payer: Self-pay | Admitting: Hematology and Oncology

## 2023-01-21 VITALS — BP 118/83 | HR 71 | Temp 98.4°F | Resp 16

## 2023-01-21 DIAGNOSIS — C50512 Malignant neoplasm of lower-outer quadrant of left female breast: Secondary | ICD-10-CM | POA: Insufficient documentation

## 2023-01-21 DIAGNOSIS — Z5111 Encounter for antineoplastic chemotherapy: Secondary | ICD-10-CM | POA: Diagnosis not present

## 2023-01-21 MED ORDER — GOSERELIN ACETATE 3.6 MG ~~LOC~~ IMPL
3.6000 mg | DRUG_IMPLANT | Freq: Once | SUBCUTANEOUS | Status: AC
Start: 2023-01-21 — End: 2023-01-21
  Administered 2023-01-21: 3.6 mg via SUBCUTANEOUS
  Filled 2023-01-21: qty 3.6

## 2023-02-18 ENCOUNTER — Inpatient Hospital Stay: Payer: BC Managed Care – PPO | Admitting: Hematology and Oncology

## 2023-02-18 ENCOUNTER — Inpatient Hospital Stay: Payer: BC Managed Care – PPO

## 2023-02-18 ENCOUNTER — Ambulatory Visit: Payer: BC Managed Care – PPO

## 2023-02-19 ENCOUNTER — Inpatient Hospital Stay (HOSPITAL_BASED_OUTPATIENT_CLINIC_OR_DEPARTMENT_OTHER): Payer: BC Managed Care – PPO | Admitting: Hematology and Oncology

## 2023-02-19 ENCOUNTER — Inpatient Hospital Stay: Payer: BC Managed Care – PPO | Attending: Hematology and Oncology

## 2023-02-19 ENCOUNTER — Telehealth: Payer: Self-pay

## 2023-02-19 VITALS — BP 120/74 | HR 66 | Temp 98.5°F | Resp 18 | Ht 66.0 in | Wt 165.5 lb

## 2023-02-19 DIAGNOSIS — Z170 Estrogen receptor positive status [ER+]: Secondary | ICD-10-CM | POA: Insufficient documentation

## 2023-02-19 DIAGNOSIS — Z5111 Encounter for antineoplastic chemotherapy: Secondary | ICD-10-CM | POA: Diagnosis not present

## 2023-02-19 DIAGNOSIS — C50512 Malignant neoplasm of lower-outer quadrant of left female breast: Secondary | ICD-10-CM

## 2023-02-19 DIAGNOSIS — M8580 Other specified disorders of bone density and structure, unspecified site: Secondary | ICD-10-CM | POA: Diagnosis not present

## 2023-02-19 DIAGNOSIS — Z79899 Other long term (current) drug therapy: Secondary | ICD-10-CM | POA: Diagnosis not present

## 2023-02-19 DIAGNOSIS — N951 Menopausal and female climacteric states: Secondary | ICD-10-CM | POA: Insufficient documentation

## 2023-02-19 DIAGNOSIS — Z79811 Long term (current) use of aromatase inhibitors: Secondary | ICD-10-CM | POA: Insufficient documentation

## 2023-02-19 MED ORDER — VENLAFAXINE HCL ER 37.5 MG PO CP24: 37.5000 mg | ORAL_CAPSULE | Freq: Every day | ORAL | 3 refills | Status: DC

## 2023-02-19 MED ORDER — GOSERELIN ACETATE 3.6 MG ~~LOC~~ IMPL
3.6000 mg | DRUG_IMPLANT | Freq: Once | SUBCUTANEOUS | Status: AC
  Administered 2023-02-19: 3.6 mg via SUBCUTANEOUS
  Filled 2023-02-19: qty 3.6

## 2023-02-19 NOTE — Progress Notes (Signed)
Patient Care Team: Billie Lade, MD as PCP - General (Internal Medicine) Serena Croissant, MD as Consulting Physician (Hematology and Oncology) Dorothy Puffer, MD as Consulting Physician (Radiation Oncology) Griselda Miner, MD as Consulting Physician (General Surgery)  DIAGNOSIS:  Encounter Diagnosis  Name Primary?   Malignant neoplasm of lower-outer quadrant of left breast of female, estrogen receptor positive (HCC) Yes    SUMMARY OF ONCOLOGIC HISTORY: Oncology History  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 (HCC)  04/24/2022 Initial Diagnosis   Left breast mass at 4 o'clock position 1.8 cm, 1 abnormal lymph node (positive), MRI revealed 2.3 cm mass: Biopsy: Grade 2 IDC with papillary features ER 80%, PR 100%, Ki-67 10%, HER2 0 negative   05/12/2022 Cancer Staging   Staging form: Breast, AJCC 8th Edition - Clinical stage from 05/12/2022: Stage IIA (cT2, cN1(f), cM0, G2, ER+, PR+, HER2-) - Signed by Ronny Bacon, PA-C on 05/14/2022 Stage prefix: Initial diagnosis Method of lymph node assessment: Core biopsy Histologic grading system: 3 grade system   06/06/2022 Surgery   Left breast lumpectomy: IDC, 1.6 cm, grade 2, margins negative, 1/4 LN + carcinoma.    06/06/2022 Oncotype testing   3/2%   06/06/2022 Cancer Staging   Staging form: Breast, AJCC 8th Edition - Pathologic stage from 06/06/2022: Stage IA (pT1c, pN1a, cM0, G2, ER+, PR+, HER2-, Oncotype DX score: 3) - Signed by Loa Socks, NP on 06/19/2022 Stage prefix: Initial diagnosis Multigene prognostic tests performed: Oncotype DX Recurrence score range: Less than 11 Histologic grading system: 3 grade system    Genetic Testing   [facility] CancerNext+RenalNext Panel+RNA was Negative. Report date is 07/23/2022.  The CancerNext+RenalNext gene panel offered by [employer] includes sequencing, rearrangement analysis, and RNA analysis for the following 46 genes:   APC, ATM, AXIN2, BAP1, BARD1,  BMPR1A, BRCA1, BRCA2, BRIP1, CDH1, CDK4, CDKN2A, CHEK2, DICER1, FH, FLCN, HOXB13, EPCAM, GREM1, MET, MITF, MLH1, MSH2, MSH3, MSH6, MUTYH, NF1, NTHL1, PALB2, PMS2, POLD1, POLE, PTEN, RAD51C, RAD51D, SDHA, SDHB, SDHC, SDHD, SMAD4, SMARCA4, STK11, TP53, TSC1, TSC2, and VHL.     07/23/2022 - 08/21/2022 Radiation Therapy   Left breast and lymph nodes: 4272 cGy in 16 treatments "Boost": 1000 cGy in 4 treatments    09/2022 -  Anti-estrogen oral therapy   Anastrozole     CHIEF COMPLIANT: Follow-up on ovarian function suppression and anastrozole  HISTORY OF PRESENT ILLNESS:  History of Present Illness   Mariah Burnett is a 48 year old female with breast cancer who presents for follow-up regarding menopausal symptoms and medication management.  She experiences menopausal symptoms, primarily hot flashes, occurring both during the day and at night, sometimes waking her up. She was started on Effexor 37 mg daily to help manage these symptoms. While the medication has helped, she still experiences some hot flashes. To aid with sleep, she uses a cooling blanket, which has been beneficial in keeping her asleep longer.  She is currently on anastrozole therapy and reports some joint stiffness, which she describes as mild and not bothersome. She maintains an active lifestyle, working out three days a week, which she believes helps manage the stiffness. Her joints make popping noises but this does not concern her.  She is taking calcium supplements, specifically Purebrand with vitamin D and K, and a chewable form that provides 650 mg of calcium per chew. She ensures her daily intake meets the recommended 1200 mg. A bone density test shows a T-score of -1.1, indicating mild osteopenia.  She takes hydroxyzine occasionally in the evenings to help relax and sleep, but is cautious about not becoming dependent on it.         ALLERGIES:  is allergic to latex.  MEDICATIONS:  Current Outpatient Medications   Medication Sig Dispense Refill   acyclovir (ZOVIRAX) 200 MG capsule Take 2 capsules (400 mg total) by mouth 3 (three) times daily. 90 capsule 3   anastrozole (ARIMIDEX) 1 MG tablet Take 1 tablet (1 mg total) by mouth daily. 90 tablet 3   desloratadine (CLARINEX) 5 MG tablet TAKE 1 TABLET DAILY AS     NEEDED FOR RUNNY NOSE OR   ITCHY EYES 90 tablet 1   hydrOXYzine (ATARAX) 25 MG tablet Take 25 mg by mouth every 8 (eight) hours as needed.     levalbuterol (XOPENEX HFA) 45 MCG/ACT inhaler Inhale 1 puff into the lungs every 6 (six) hours as needed for wheezing. 15 g 2   meloxicam (MOBIC) 15 MG tablet Take 1 tablet (15 mg total) by mouth daily as needed. 30 tablet 0   metoprolol tartrate (LOPRESSOR) 25 MG tablet TAKE 1/2 TABLET TWICE A DAY 90 tablet 3   venlafaxine XR (EFFEXOR-XR) 37.5 MG 24 hr capsule Take 1 capsule (37.5 mg total) by mouth daily with breakfast. 90 capsule 3   No current facility-administered medications for this visit.   Facility-Administered Medications Ordered in Other Visits  Medication Dose Route Frequency Provider Last Rate Last Admin   goserelin (ZOLADEX) injection 3.6 mg  3.6 mg Subcutaneous Once Serena Croissant, MD        PHYSICAL EXAMINATION: ECOG PERFORMANCE STATUS: 1 - Symptomatic but completely ambulatory  Vitals:   02/19/23 1400  BP: 120/74  Pulse: 66  Resp: 18  Temp: 98.5 F (36.9 C)  SpO2: 100%   Filed Weights   02/19/23 1400  Weight: 165 lb 8 oz (75.1 kg)    LABORATORY DATA:  I have reviewed the data as listed    Latest Ref Rng & Units 11/26/2022    9:28 AM 10/02/2022   12:33 PM 04/05/2019   12:47 PM  CMP  Glucose 70 - 99 mg/dL 89  88  96   BUN 6 - 20 mg/dL 11  13  13    Creatinine 0.44 - 1.00 mg/dL 1.61  0.96  0.45   Sodium 135 - 145 mmol/L 137  138  136   Potassium 3.5 - 5.1 mmol/L 3.9  4.3  3.4   Chloride 98 - 111 mmol/L 104  107  103   CO2 22 - 32 mmol/L 27  26  24    Calcium 8.9 - 10.3 mg/dL 9.8  9.5  9.3   Total Protein 6.5 - 8.1 g/dL  7.4  7.0    Total Bilirubin <1.2 mg/dL 0.7  1.0    Alkaline Phos 38 - 126 U/L 49  47    AST 15 - 41 U/L 16  15    ALT 0 - 44 U/L 13  13      Lab Results  Component Value Date   WBC 3.3 (L) 11/26/2022   HGB 13.2 11/26/2022   HCT 38.4 11/26/2022   MCV 94.3 11/26/2022   PLT 220 11/26/2022   NEUTROABS 2.0 11/26/2022    ASSESSMENT & PLAN:  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 (HCC) 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%, PR  100%, Ki-67 10%, HER2 0 negative  06/06/2022:Left breast lumpectomy: IDC, 1.6 cm, grade 2, margins negative, 1/4 LN + carcinoma  06/06/2022: Oncotype: Recurrence score 3 (risk of distant recurrence 2%) Patient decided not to dissipate in OFFSET clinical trial or chemo with TC and decided on OFS with AI   Adjuvant XRT to be done in [place]   Treatment Plan: Adjuvant anti-estrogen therapy with Zoladex with anastrozole started 09/04/22 Return to clinic monthly for injections  12/11/2022: Bone density: T-score -1.1: Mild osteopenia: Calcium vitamin D with weightbearing exercises Return to clinic monthly for injections and in 6 months for follow-up with me. ------------------------------------- Assessment and Plan    Breast Cancer Surveillance On anastrozole with mild joint stiffness managed by regular exercise. Recommended to switch from Signatera to Bantam for cancer surveillance due to broader detection range and less invasive nature. Guardant is a blood-based assay that detects any type of breast cancer, unlike Signatera which requires a large pathology sample and only detects the original cancer type. Guardant test is preferred for its comprehensive detection and non-discriminatory nature. Insurance coverage varies, but [facility] does not charge if not covered. - Order Guardant test every six months - Continue  anastrozole - Schedule follow-up for results in two weeks  Menopausal Symptoms Reports ongoing hot flashes despite being on Effexor 37.5 mg daily. Symptoms include daytime hot flashes and nocturnal awakenings. Using a cooling blanket which has helped with sleep. - Continue Effexor 37.5 mg daily - Use cooling blanket as needed  Osteopenia Bone density scan shows a T-score of -1.1, indicating mild osteopenia. Taking calcium supplements with vitamin D and K. Current intake meets daily requirements. - Continue calcium supplements (650 mg twice daily) - Ensure vitamin D intake up to 5000 IU daily  General Health Maintenance Mammogram scheduled for April 18th. Bone density scan recently completed. - Ensure mammogram appointment on April 18th  Follow-up - Monthly injections scheduled until October - Follow-up with Mardella Layman in October - Next visit in February next year - Refill Effexor with a year's worth of refills - Refill anastrozole as needed before end of August - Use hydroxyzine as needed, not more frequently than every 3-4 days.          No orders of the defined types were placed in this encounter.  The patient has a good understanding of the overall plan. she agrees with it. she will call with any problems that may develop before the next visit here. Total time spent: 30 mins including face to face time and time spent for planning, charting and co-ordination of care   Tamsen Meek, MD 02/19/23

## 2023-02-19 NOTE — Assessment & Plan Note (Signed)
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  06/06/2022:Left breast lumpectomy: IDC, 1.6 cm, grade 2, margins negative, 1/4 LN + carcinoma  06/06/2022: Oncotype: Recurrence score 3 (risk of distant recurrence 2%) Patient decided not to dissipate in OFFSET clinical trial or chemo with TC and decided on OFS with AI   Adjuvant XRT to be done in [place]   Treatment Plan: Adjuvant anti-estrogen therapy with Zoladex with anastrozole Return to clinic monthly for injections  12/11/2022: Bone density: T-score -1.1: Mild osteopenia: Calcium vitamin D with weightbearing exercises Return to clinic monthly for injections and in 6 months for follow-up with me.

## 2023-02-19 NOTE — Telephone Encounter (Signed)
Per md orders entered for Guardant Reveal and all supported documents faxed to [phone]. Faxed confirmation was received.

## 2023-03-09 ENCOUNTER — Ambulatory Visit: Payer: BC Managed Care – PPO | Admitting: Internal Medicine

## 2023-03-09 IMAGING — MG MM DIGITAL SCREENING BILAT W/ TOMO AND CAD
6 of 10 series · 6 of 30 positions shown · non-contrast
Comparison: Previous exam(s).

CLINICAL DATA: Screening.

EXAM:
DIGITAL SCREENING BILATERAL MAMMOGRAM WITH TOMOSYNTHESIS AND CAD
TECHNIQUE: Bilateral screening digital craniocaudal and mediolateral oblique
mammograms were obtained. Bilateral screening digital breast
tomosynthesis was performed. The images were evaluated with
computer-aided detection.

[L CC synth-2D]
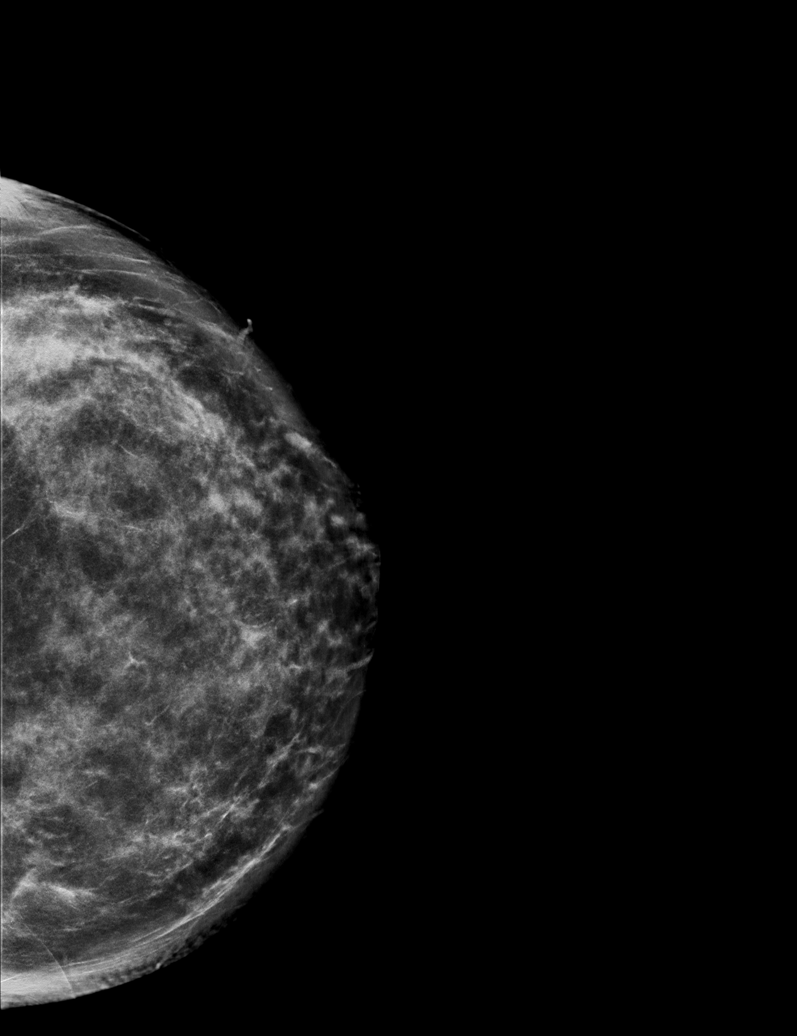

[R CC synth-2D]
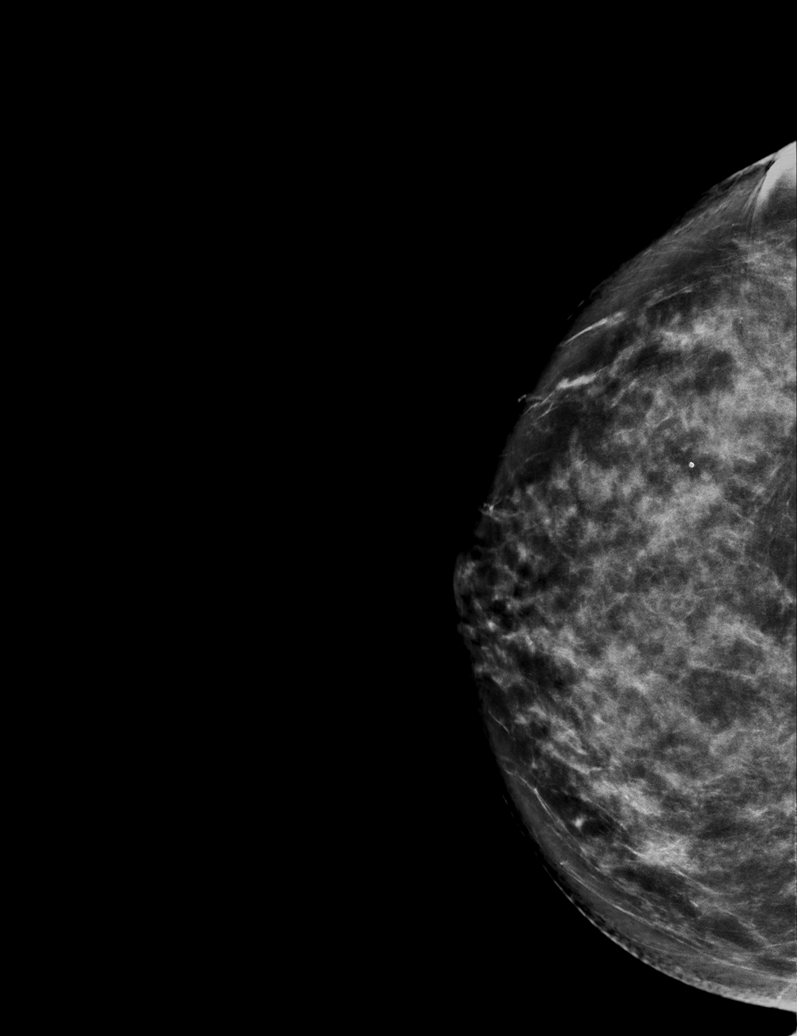

[L MLO synth-2D (1 of 2)]
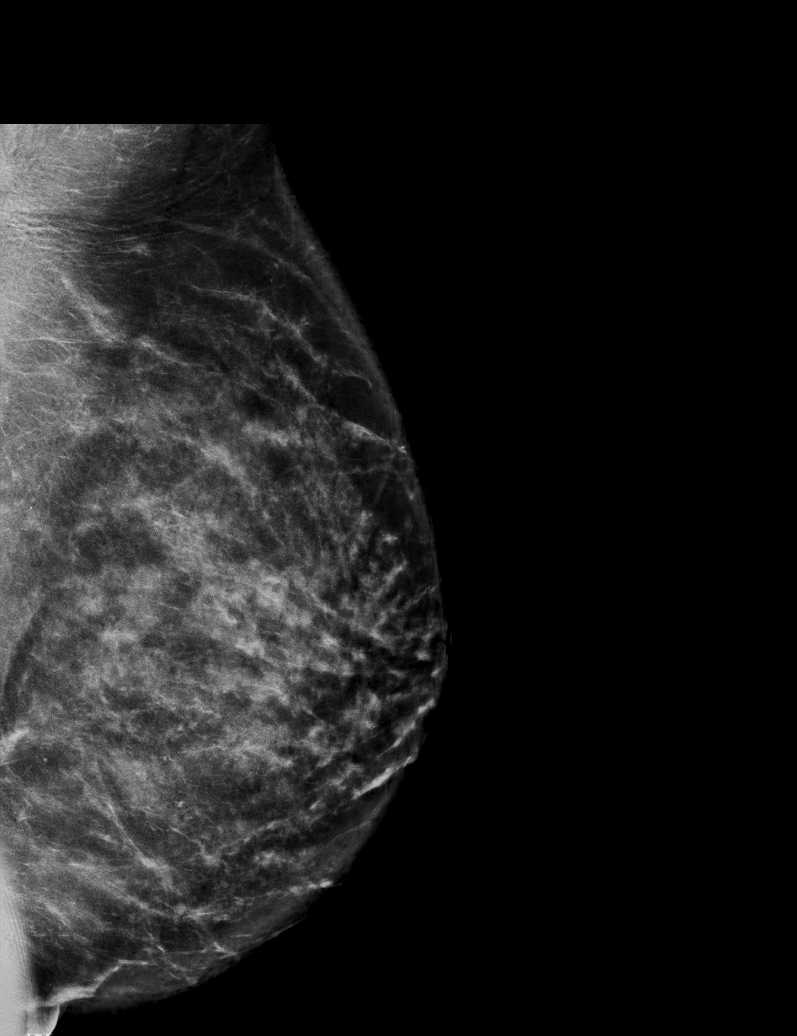

[R MLO synth-2D]
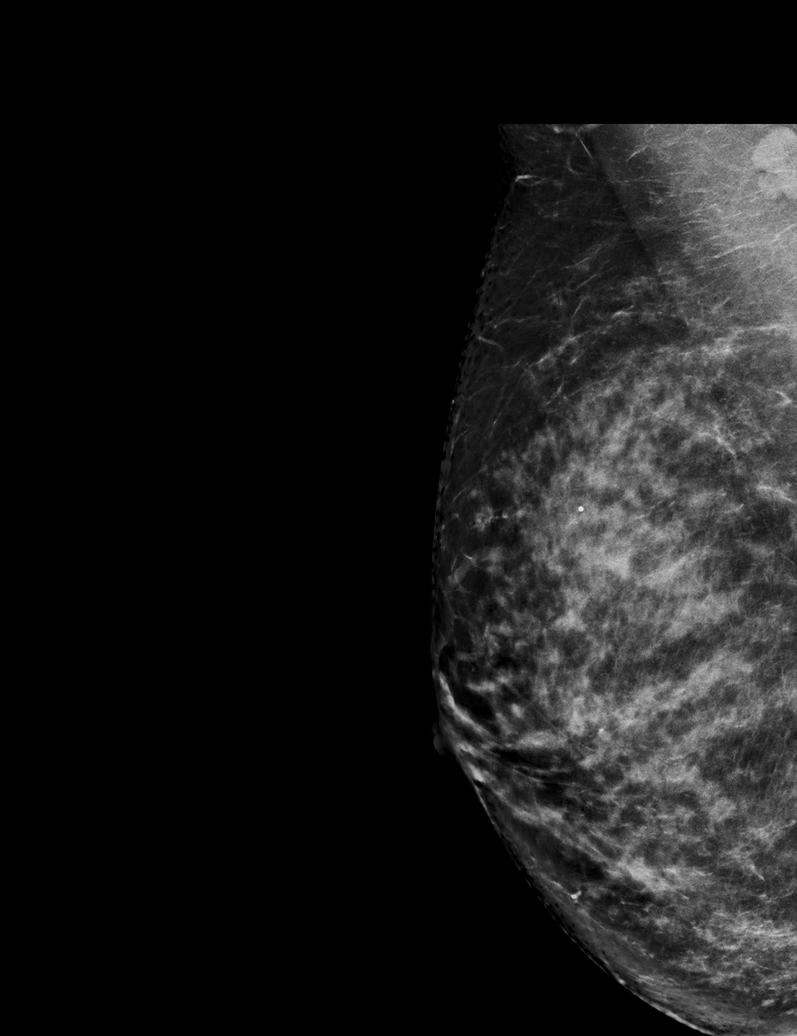

[L MLO synth-2D (2 of 2)]
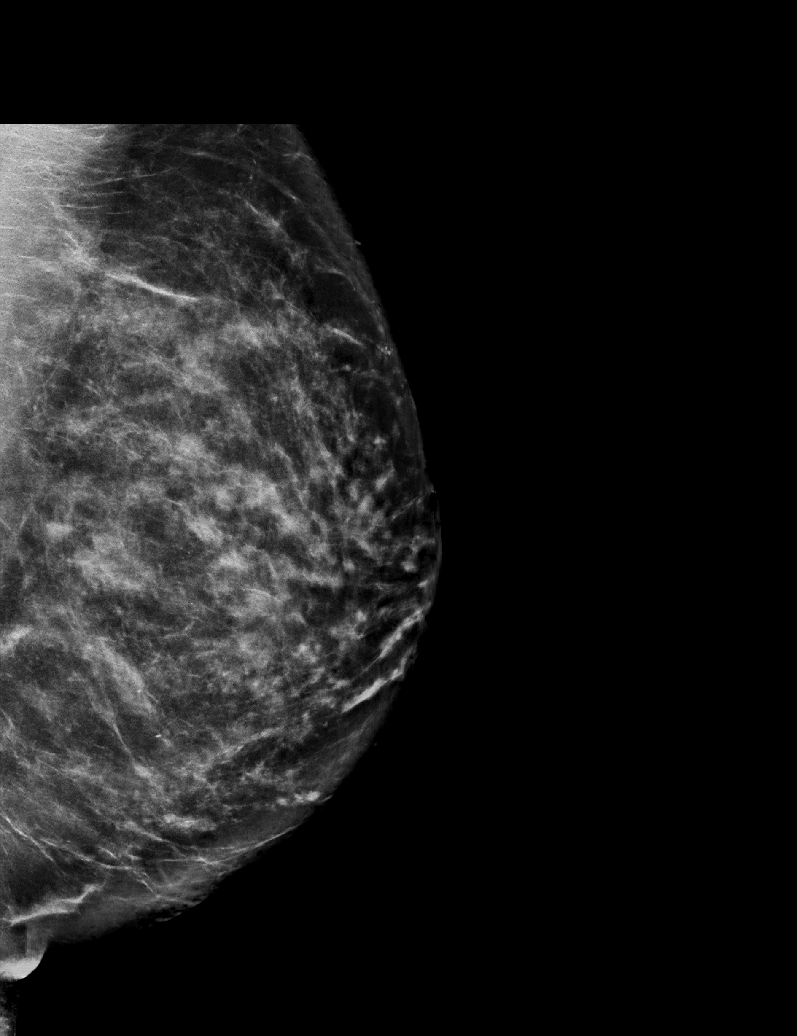

[L CC tomo · tomo slice 49/96.0]
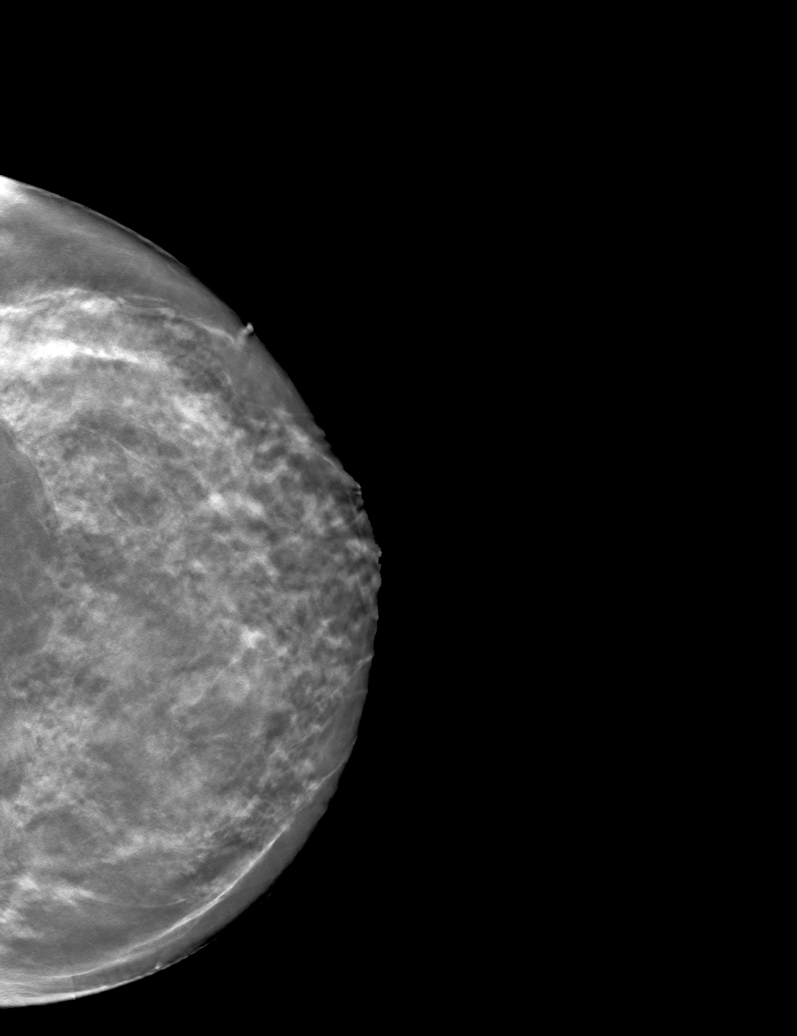

[6 of 30 positions shown; findings below may reference images not displayed]

ACR Breast Density Category c: The breast tissue is heterogeneously
dense, which may obscure small masses.
FINDINGS: There are no findings suspicious for malignancy.
IMPRESSION: No mammographic evidence of malignancy. A result letter of this
screening mammogram will be mailed directly to the patient.

RECOMMENDATION:
Screening mammogram in one year. (Code:Q3-W-BC3)

BI-RADS CATEGORY  1: Negative.

## 2023-03-10 ENCOUNTER — Telehealth: Payer: Self-pay | Admitting: *Deleted

## 2023-03-10 ENCOUNTER — Other Ambulatory Visit: Payer: Self-pay | Admitting: Allergy & Immunology

## 2023-03-10 DIAGNOSIS — C50519 Malignant neoplasm of lower-outer quadrant of unspecified female breast: Secondary | ICD-10-CM | POA: Diagnosis not present

## 2023-03-10 NOTE — Telephone Encounter (Signed)
 Called to make pt aware of negative Guardant reveal results. Pt was appreciative and verbalized understanding. Advised to f/u as scheduled.

## 2023-03-11 ENCOUNTER — Encounter: Payer: Self-pay | Admitting: Hematology and Oncology

## 2023-03-16 ENCOUNTER — Ambulatory Visit: Payer: BC Managed Care – PPO | Attending: General Surgery

## 2023-03-16 VITALS — Wt 163.4 lb

## 2023-03-16 DIAGNOSIS — Z483 Aftercare following surgery for neoplasm: Secondary | ICD-10-CM | POA: Insufficient documentation

## 2023-03-16 NOTE — Therapy (Signed)
 OUTPATIENT PHYSICAL THERAPY SOZO SCREENING NOTE   Patient Name: Mariah Burnett MRN: [id number] DOB:07/09/1975, 48 y.o., female Today's Date: 03/16/2023  PCP: Billie Lade, MD REFERRING PROVIDER: Griselda Miner, MD   PT End of Session - 03/16/23 1653     Visit Number 15   # unchanged due to screen only   PT Start Time 1649    PT Stop Time 1655    PT Time Calculation (min) 6 min    Activity Tolerance Patient tolerated treatment well    Behavior During Therapy [facility] for tasks assessed/performed             Past Medical History:  Diagnosis Date   Arthritis    Asthma    Breast cancer (HCC) 04/24/2022   MVP (mitral valve prolapse)    Although no evidence per echo in 2009. Trace MR   Palpitation    Scoliosis    Urticaria    Vaginal Pap smear, abnormal    Past Surgical History:  Procedure Laterality Date   BREAST BIOPSY Left 04/24/2022   [place] LT BREAST BX W LOC DEV 1ST LESION IMG BX SPEC US GUIDE 04/24/2022 [facility]-ULTRASOUND   BREAST BIOPSY Left 04/24/2022   [place] LT BREAST BX W LOC DEV EA ADD LESION IMG BX SPEC US GUIDE 04/24/2022 AP-ULTRASOUND   BREAST BIOPSY Left 06/04/2022   [place] LT RADIOACTIVE SEED LOC 06/04/2022 [facility] MAMMOGRAP[facility]   BREAST BIOPSY Left 06/04/2022   [place] LT RADIOACTIVE SEED EA ADD LESION 06/04/2022 [facility] MAMMOGRAPHY   BREAST LUMPECTOMY WITH RADIOACTIVE SEED AND SENTINEL LYMPH NODE BIOPSY Left 06/06/2022   Procedure: LEFT BREAST LUMPECTOMY WITH RAADIOACTIVE SEED AND SENTINEL LYMPH NODE BIOPSY;  Surgeon: Griselda Miner, MD;  Location: [facility];  Service: General;  Laterality: Left;   KIDNEY STONE SURGERY Bilateral 2000   RADIOACTIVE SEED GUIDED AXILLARY SENTINEL LYMPH NODE Left 06/06/2022   Procedure: RADIOACTIVE SEED GUIDED LEFT AXILLARY SENTINEL LYMPH NODE DISSECTION;  Surgeon: Griselda Miner, MD;  Location: [facility];  Service: General;  Laterality: Left;   WISDOM TOOTH EXTRACTION Bilateral 1991   Patient Active Problem List   Diagnosis  Date Noted   COVID-19 12/25/2022   Genetic testing 07/25/2022   Family history of breast cancer 07/15/2022   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 (HCC) 05/12/2022   Chest tightness 03/04/2022   Family history of coronary artery disease in mother 03/04/2022   Hyperlipidemia 10/17/2021   Encounter to establish care 10/17/2021   Biceps tendinitis, left 10/17/2021   Mild intermittent asthma without complication 01/14/2021   Seasonal and perennial allergic rhinitis 01/14/2021   Dizziness 04/23/2020   Abnormal auditory perception of both ears 04/23/2020   Heart murmur 03/17/2018   Asthma 03/17/2018   Shortness of breath 09/25/2010   Palpitation    MVP (mitral valve prolapse)    Scoliosis     REFERRING DIAG: left breast cancer at risk for lymphedema  THERAPY DIAG: Aftercare following surgery for neoplasm  PERTINENT HISTORY: Lt lumpectomy due to [facility] 06/06/22. 1/4+ nodes. Radiation planned.   PRECAUTIONS: left UE Lymphedema risk, None  SUBJECTIVE: Pt returns for her 3 month L-Dex screen.   PAIN:  Are you having pain? No  SOZO SCREENING: Patient was assessed today using the SOZO machine to determine the lymphedema index score. This was compared to her baseline score. It was determined that she is within the recommended range when compared to her baseline and no further action is needed at this  time. She will continue SOZO screenings. These are done every 3 months for 2 years post operatively followed by every 6 months for 2 years, and then annually.   L-DEX FLOWSHEETS - 03/16/23 1600       L-DEX LYMPHEDEMA SCREENING   Measurement Type Unilateral    L-DEX MEASUREMENT EXTREMITY Upper Extremity    POSITION  Standing    DOMINANT SIDE Right    At Risk Side Left    BASELINE SCORE (UNILATERAL) 0.6    L-DEX SCORE (UNILATERAL) 2.1    VALUE CHANGE (UNILAT) 1.5               Hermenia Bers, PTA 03/16/2023, 4:54 PM

## 2023-03-18 ENCOUNTER — Inpatient Hospital Stay: Payer: BC Managed Care – PPO | Attending: Hematology and Oncology

## 2023-03-18 VITALS — BP 121/85 | HR 67 | Temp 98.6°F | Resp 18

## 2023-03-18 DIAGNOSIS — Z5111 Encounter for antineoplastic chemotherapy: Secondary | ICD-10-CM | POA: Insufficient documentation

## 2023-03-18 DIAGNOSIS — Z170 Estrogen receptor positive status [ER+]: Secondary | ICD-10-CM

## 2023-03-18 DIAGNOSIS — C50512 Malignant neoplasm of lower-outer quadrant of left female breast: Secondary | ICD-10-CM | POA: Insufficient documentation

## 2023-03-18 MED ORDER — GOSERELIN ACETATE 3.6 MG ~~LOC~~ IMPL
3.6000 mg | DRUG_IMPLANT | Freq: Once | SUBCUTANEOUS | Status: AC
  Administered 2023-03-18: 3.6 mg via SUBCUTANEOUS
  Filled 2023-03-18: qty 3.6

## 2023-03-20 ENCOUNTER — Ambulatory Visit: Payer: BC Managed Care – PPO | Attending: Internal Medicine | Admitting: Internal Medicine

## 2023-03-20 ENCOUNTER — Encounter: Payer: Self-pay | Admitting: Internal Medicine

## 2023-03-20 VITALS — BP 110/82 | HR 66 | Ht 67.0 in | Wt 165.2 lb

## 2023-03-20 DIAGNOSIS — Z8249 Family history of ischemic heart disease and other diseases of the circulatory system: Secondary | ICD-10-CM | POA: Diagnosis not present

## 2023-03-20 DIAGNOSIS — R002 Palpitations: Secondary | ICD-10-CM | POA: Diagnosis not present

## 2023-03-20 NOTE — Progress Notes (Signed)
 Cardiology Office Note  Date: 03/20/2023   ID: Mariah Burnett, DOB 1975-10-18, MRN [id number]  PCP:  Billie Lade, MD  Cardiologist:  None Electrophysiologist:  None   Reason for Office Visit: Follow-up palpitations   History of Present Illness: Mariah Burnett is a 48 y.o. female known to have asthma presented to cardiology clinic for follow-up of palpitations.  She was diagnosed with breast cancer in 2024, underwent lumpectomy and radiation therapy.  No chemotherapy.  She had palpitations sometimes and compliant with medications.  No chest pains.  Working out.  No other symptoms of DOE, dizziness, syncope, leg swelling.  Past Medical History:  Diagnosis Date   Arthritis    Asthma    Breast cancer (HCC) 04/24/2022   MVP (mitral valve prolapse)    Although no evidence per echo in 2009. Trace MR   Palpitation    Scoliosis    Urticaria    Vaginal Pap smear, abnormal     Past Surgical History:  Procedure Laterality Date   BREAST BIOPSY Left 04/24/2022   [place] LT BREAST BX W LOC DEV 1ST LESION IMG BX SPEC US GUIDE 04/24/2022 [facility]   BREAST BIOPSY Left 04/24/2022   [place] LT BREAST BX W LOC DEV EA ADD LESION IMG BX SPEC US GUIDE 04/24/2022 [facility]   BREAST BIOPSY Left 06/04/2022   [place] LT RADIOACTIVE SEED LOC 06/04/2022 [facility]   BREAST BIOPSY Left 06/04/2022   [place] LT RADIOACTIVE SEED EA ADD LESION 06/04/2022 [facility]   BREAST LUMPECTOMY WITH RADIOACTIVE SEED AND SENTINEL LYMPH NODE BIOPSY Left 06/06/2022   Procedure: LEFT BREAST LUMPECTOMY WITH RAADIOACTIVE SEED AND SENTINEL LYMPH NODE BIOPSY;  Surgeon: Griselda Miner, MD;  Location: [facility];  Service: General;  Laterality: Left;   KIDNEY STONE SURGERY Bilateral 2000   RADIOACTIVE SEED GUIDED AXILLARY SENTINEL LYMPH NODE Left 06/06/2022   Procedure: RADIOACTIVE SEED GUIDED LEFT AXILLARY SENTINEL LYMPH NODE DISSECTION;  Surgeon: Chevis Pretty III, MD;  Location:  SURGERY CENTER;   Service: General;  Laterality: Left;   WISDOM TOOTH EXTRACTION Bilateral 1991    Current Outpatient Medications  Medication Sig Dispense Refill   acyclovir (ZOVIRAX) 200 MG capsule Take 2 capsules (400 mg total) by mouth 3 (three) times daily. 90 capsule 3   anastrozole (ARIMIDEX) 1 MG tablet Take 1 tablet (1 mg total) by mouth daily. 90 tablet 3   desloratadine (CLARINEX) 5 MG tablet TAKE 1 TABLET DAILY AS     NEEDED FOR RUNNY NOSE OR   ITCHY EYES 90 tablet 1   hydrOXYzine (ATARAX) 25 MG tablet Take 25 mg by mouth every 8 (eight) hours as needed.     levalbuterol (XOPENEX HFA) 45 MCG/ACT inhaler Inhale 1 puff into the lungs every 6 (six) hours as needed for wheezing. 15 g 2   meloxicam (MOBIC) 15 MG tablet Take 1 tablet (15 mg total) by mouth daily as needed. 30 tablet 0   metoprolol tartrate (LOPRESSOR) 25 MG tablet TAKE 1/2 TABLET TWICE A DAY 90 tablet 3   venlafaxine XR (EFFEXOR-XR) 37.5 MG 24 hr capsule Take 1 capsule (37.5 mg total) by mouth daily with breakfast. 90 capsule 3   No current facility-administered medications for this visit.   Allergies:  Latex   Social History: The patient  reports that she has never smoked. She has never used smokeless tobacco. She reports current alcohol use. She reports that she does not use drugs.   Family History: The patient's family history  includes Breast cancer (age of onset: 62) in her maternal aunt; Breast cancer (age of onset: 65) in her maternal aunt; Breast cancer (age of onset: 64) in her paternal grandmother; Breast cancer (age of onset: 72) in her mother; Emphysema in her paternal grandfather; Heart attack in her mother; Heart disease in her mother; Lung cancer in her paternal grandfather; Renal cancer (age of onset: 21) in her father; Urticaria in her mother.   ROS:  Please see the history of present illness. Otherwise, complete review of systems is positive for none.  All other systems are reviewed and negative.   Physical Exam: VS:   BP 110/82   Pulse 66   Ht 5\' 7"  (1.702 m)   Wt 165 lb 3.2 oz (74.9 kg)   SpO2 98%   BMI 25.87 kg/m , BMI Body mass index is 25.87 kg/m.  Wt Readings from Last 3 Encounters:  03/20/23 165 lb 3.2 oz (74.9 kg)  03/16/23 163 lb 6 oz (74.1 kg)  02/19/23 165 lb 8 oz (75.1 kg)    General: Patient appears comfortable at rest. HEENT: Conjunctiva and lids normal, oropharynx clear with moist mucosa. Neck: Supple, no elevated JVP or carotid bruits, no thyromegaly. Lungs: Clear to auscultation, nonlabored breathing at rest. Cardiac: Regular rate and rhythm, no S3 or significant systolic murmur, no pericardial rub. Abdomen: Soft, nontender, no hepatomegaly, bowel sounds present, no guarding or rebound. Extremities: No pitting edema, distal pulses 2+. Skin: Warm and dry. Musculoskeletal: No kyphosis. Neuropsychiatric: Alert and oriented x3, affect grossly appropriate.  ECG:  An ECG dated 03/04/2022 was personally reviewed today and demonstrated:  Normal sinus rhythm, no ST-T changes  Recent Labwork: 11/26/2022: ALT 13; AST 16; BUN 11; Creatinine 0.84; Hemoglobin 13.2; Platelet Count 220; Potassium 3.9; Sodium 137     Component Value Date/Time   CHOL 179 04/27/2018 1512   TRIG 95 04/27/2018 1512   HDL 61 04/27/2018 1512   CHOLHDL 2.9 04/27/2018 1512   CHOLHDL 3 09/25/2010 0837   VLDL 25.8 09/25/2010 0837   LDLCALC 99 04/27/2018 1512    Other Studies Reviewed Today: Gram from 2009 and 2012 showed normal LVEF, trivial MR and no evidence of mitral valve prolapse.  Assessment and Plan:   # Palpitations -No interval palpitations.  Currently on metoprolol tartrate 12.5 mg twice daily for symptomatic relief.  Can follow-up with PCP from now on.  # History of mitral valve prolapse -Echocardiogram from 2009 and 2012 showed no evidence of mitral valve prolapse.  Trivial MR was noted.  No indication to repeat echocardiogram in the absence of symptoms.  # Family history premature ASCVD, mother  had PCI in early 20s # HLD -Will obtain lipoprotein a levels.  No chest pain in the interval time.  Works out almost daily with no symptoms of angina or DOE.    Medication Adjustments/Labs and Tests Ordered: Current medicines are reviewed at length with the patient today.  Concerns regarding medicines are outlined above.   Tests Ordered: Orders Placed This Encounter  Procedures   EKG 12-Lead    Medication Changes: No orders of the defined types were placed in this encounter.   Disposition:  Follow up as needed  Signed, Xaniyah Buchholz Verne Spurr, MD, 03/20/2023 9:09 AM    [facility] at [facility] [address], [place], [place] [address]

## 2023-03-20 NOTE — Patient Instructions (Signed)
 Medication Instructions:  Your physician recommends that you continue on your current medications as directed. Please refer to the Current Medication list given to you today.  *If you need a refill on your cardiac medications before your next appointment, please call your pharmacy*   Lab Work: None If you have labs (blood work) drawn today and your tests are completely normal, you will receive your results only by: MyChart Message (if you have MyChart) OR A paper copy in the mail If you have any lab test that is abnormal or we need to change your treatment, we will call you to review the results.   Testing/Procedures: None    Follow-Up: At [facility], you and your health needs are our priority.  As part of our continuing mission to provide you with exceptional heart care, we have created designated Provider Care Teams.  These Care Teams include your primary Cardiologist (physician) and Advanced Practice Providers (APPs -  Physician Assistants and Nurse Practitioners) who all work together to provide you with the care you need, when you need it.  We recommend signing up for the patient portal called "MyChart".  Sign up information is provided on this After Visit Summary.  MyChart is used to connect with patients for Virtual Visits (Telemedicine).  Patients are able to view lab/test results, encounter notes, upcoming appointments, etc.  Non-urgent messages can be sent to your provider as well.   To learn more about what you can do with MyChart, go to [email or web address].    Your next appointment:    Follow up as needed.  Provider:   You may see Luane School, MD or one of the following Advanced Practice Providers on your designated Care Team:   [place], PA-C  Jacolyn Reedy, [place]     Other Instructions

## 2023-03-26 ENCOUNTER — Ambulatory Visit: Payer: BC Managed Care – PPO | Admitting: Internal Medicine

## 2023-03-26 ENCOUNTER — Encounter: Payer: Self-pay | Admitting: Internal Medicine

## 2023-03-26 VITALS — BP 119/76 | HR 72 | Ht 67.0 in | Wt 164.6 lb

## 2023-03-26 DIAGNOSIS — Z853 Personal history of malignant neoplasm of breast: Secondary | ICD-10-CM | POA: Diagnosis not present

## 2023-03-26 DIAGNOSIS — J4520 Mild intermittent asthma, uncomplicated: Secondary | ICD-10-CM | POA: Diagnosis not present

## 2023-03-26 DIAGNOSIS — R002 Palpitations: Secondary | ICD-10-CM | POA: Diagnosis not present

## 2023-03-26 NOTE — Progress Notes (Signed)
 Established Patient Office Visit  Subjective   Patient ID: Mariah Burnett, female    DOB: 29-Aug-1975  Age: 48 y.o. MRN: [id number]  Chief Complaint  Patient presents with   Care Management    Three month follow up   Mariah Burnett returns to care today for routine follow-up.  She was last evaluated by me in December 2024 for an acute visit in the setting of COVID-19.  Previously seen by me in October 2023 as a new patient presenting to establish care in the interim, she was diagnosed with left breast cancer in April 2024 she underwent lumpectomy in May 40981 and completed radiation therapy between July-August.  She is currently taking anastrozole  and is on Zoladex  monthly injections through October 2025.  Ms. Toppin reports feeling well today.  She endorses insomnia, stating that she has difficulty staying asleep.  Not interested in starting any prescription medication.  She is otherwise asymptomatic and has no acute concerns to discuss.  Past Medical History:  Diagnosis Date   Arthritis    Asthma    Breast cancer (HCC) 04/24/2022   MVP (mitral valve prolapse)    Although no evidence per echo in 2009. Trace MR   Palpitation    Scoliosis    Urticaria    Vaginal Pap smear, abnormal    Past Surgical History:  Procedure Laterality Date   BREAST BIOPSY Left 04/24/2022   US  LT BREAST BX W LOC DEV 1ST LESION IMG BX SPEC US  GUIDE 04/24/2022 [facility]   BREAST BIOPSY Left 04/24/2022   US  LT BREAST BX W LOC DEV EA ADD LESION IMG BX SPEC US  GUIDE 04/24/2022 [facility]   BREAST BIOPSY Left 06/04/2022   US  LT RADIOACTIVE SEED LOC 06/04/2022 [facility]   BREAST BIOPSY Left 06/04/2022   US  LT RADIOACTIVE SEED EA ADD LESION 06/04/2022 [facility]   BREAST LUMPECTOMY WITH RADIOACTIVE SEED AND SENTINEL LYMPH NODE BIOPSY Left 06/06/2022   Procedure: LEFT BREAST LUMPECTOMY WITH RAADIOACTIVE SEED AND SENTINEL LYMPH NODE BIOPSY;  Surgeon: Caralyn Chandler, MD;  Location: [facility];  Service: General;  Laterality: Left;   KIDNEY STONE SURGERY Bilateral 2000   RADIOACTIVE SEED GUIDED AXILLARY SENTINEL LYMPH NODE Left 06/06/2022   Procedure: RADIOACTIVE SEED GUIDED LEFT AXILLARY SENTINEL LYMPH NODE DISSECTION;  Surgeon: Caralyn Chandler, MD;  Location: [facility];  Service: General;  Laterality: Left;   WISDOM TOOTH EXTRACTION Bilateral 1991   Social History   Tobacco Use   Smoking status: Never   Smokeless tobacco: Never  Vaping Use   Vaping status: Never Used  Substance Use Topics   Alcohol use: Yes    Comment: occ   Drug use: No   Family History  Problem Relation Age of Onset   Heart disease Mother    Heart attack Mother    Breast cancer Mother 64   Urticaria Mother    Renal cancer Father 53       metastatic   Breast cancer Maternal Aunt 40   Breast cancer Maternal Aunt 55   Breast cancer Paternal Grandmother 46   Lung cancer Paternal Grandfather        smoked   Emphysema Paternal Grandfather    Allergies  Allergen Reactions   Latex Rash   Review of Systems  Constitutional:  Negative for chills and fever.  HENT:  Negative for sore throat.   Respiratory:  Negative for cough and shortness of breath.   Cardiovascular:  Negative for chest  pain, palpitations and leg swelling.  Gastrointestinal:  Negative for abdominal pain, blood in stool, constipation, diarrhea, nausea and vomiting.  Genitourinary:  Negative for dysuria and hematuria.  Musculoskeletal:  Negative for myalgias.  Skin:  Negative for itching and rash.  Neurological:  Negative for dizziness and headaches.  Psychiatric/Behavioral:  Negative for depression and suicidal ideas. The patient has insomnia.      Objective:     BP 119/76 (BP Location: Right Arm, Patient Position: Sitting, Cuff Size: Normal)   Pulse 72   Ht 5\' 7"  (1.702 m)   Wt 164 lb 9.6 oz (74.7 kg)   SpO2 96%   BMI 25.78 kg/m  BP Readings from Last 3 Encounters:  03/26/23 119/76  03/20/23 110/82   03/18/23 121/85   Physical Exam Vitals reviewed.  Constitutional:      General: She is not in acute distress.    Appearance: Normal appearance. She is not toxic-appearing.  HENT:     Head: Normocephalic and atraumatic.     Right Ear: External ear normal.     Left Ear: External ear normal.     Nose: Nose normal. No congestion or rhinorrhea.     Mouth/Throat:     Mouth: Mucous membranes are moist.     Pharynx: Oropharynx is clear. No oropharyngeal exudate or posterior oropharyngeal erythema.  Eyes:     General: No scleral icterus.    Extraocular Movements: Extraocular movements intact.     Conjunctiva/sclera: Conjunctivae normal.     Pupils: Pupils are equal, round, and reactive to light.  Cardiovascular:     Rate and Rhythm: Normal rate and regular rhythm.     Pulses: Normal pulses.     Heart sounds: Normal heart sounds. No murmur heard.    No friction rub. No gallop.  Pulmonary:     Effort: Pulmonary effort is normal.     Breath sounds: Normal breath sounds. No wheezing, rhonchi or rales.  Abdominal:     General: Abdomen is flat. Bowel sounds are normal. There is no distension.     Palpations: Abdomen is soft.     Tenderness: There is no abdominal tenderness.  Musculoskeletal:        General: No swelling. Normal range of motion.     Cervical back: Normal range of motion.     Right lower leg: No edema.     Left lower leg: No edema.  Lymphadenopathy:     Cervical: No cervical adenopathy.  Skin:    General: Skin is warm and dry.     Capillary Refill: Capillary refill takes less than 2 seconds.     Coloration: Skin is not jaundiced.  Neurological:     General: No focal deficit present.     Mental Status: She is alert and oriented to person, place, and time.  Psychiatric:        Mood and Affect: Mood normal.        Behavior: Behavior normal.   Last CBC Lab Results  Component Value Date   WBC 3.3 (L) 11/26/2022   HGB 13.2 11/26/2022   HCT 38.4 11/26/2022   MCV 94.3  11/26/2022   MCH 32.4 11/26/2022   RDW 11.5 11/26/2022   PLT 220 11/26/2022   Last metabolic panel Lab Results  Component Value Date   GLUCOSE 89 11/26/2022   NA 137 11/26/2022   K 3.9 11/26/2022   CL 104 11/26/2022   CO2 27 11/26/2022   BUN 11 11/26/2022   CREATININE 0.84 11/26/2022  GFRNONAA >60 11/26/2022   CALCIUM 9.8 11/26/2022   PROT 7.4 11/26/2022   ALBUMIN 4.5 11/26/2022   BILITOT 0.7 11/26/2022   ALKPHOS 49 11/26/2022   AST 16 11/26/2022   ALT 13 11/26/2022   ANIONGAP 6 11/26/2022   Last lipids Lab Results  Component Value Date   CHOL 179 04/27/2018   HDL 61 04/27/2018   LDLCALC 99 04/27/2018   TRIG 95 04/27/2018   CHOLHDL 2.9 04/27/2018   Last hemoglobin A1c Lab Results  Component Value Date   HGBA1C 5.2 04/27/2018   Last thyroid  functions Lab Results  Component Value Date   TSH 2.084 06/26/2017     Assessment & Plan:   Problem List Items Addressed This Visit       Mild intermittent asthma without complication   Symptoms remain well-controlled with as needed use of Xopenex.  Pulmonary exam today is unremarkable.      Heart palpitations - Primary   Symptoms remain well-controlled with metoprolol  tartrate 12.5 mg twice daily.  We will refill this medication going forward.      History of left breast cancer   Diagnosed April 2024.  S/p lumpectomy and XRT.  Currently on anastrozole  and monthly Zoladex  injections.  Recently seen by oncology for follow-up.  Her next appointment is scheduled for June.       Return in about 1 year (around 03/25/2024).    Tobi Fortes, MD

## 2023-03-26 NOTE — Patient Instructions (Signed)
 It was a pleasure to see you today.  Thank you for giving [place] the opportunity to be involved in your care.  Below is a brief recap of your visit and next steps.  We will plan to see you again in 1 year.  Summary No medication changes today We will fill metoprolol going forward Follow up in 1 year

## 2023-04-08 ENCOUNTER — Encounter: Payer: Self-pay | Admitting: Internal Medicine

## 2023-04-08 ENCOUNTER — Encounter: Payer: Self-pay | Admitting: Hematology and Oncology

## 2023-04-09 ENCOUNTER — Ambulatory Visit: Admitting: *Deleted

## 2023-04-09 ENCOUNTER — Encounter: Payer: Self-pay | Admitting: Obstetrics & Gynecology

## 2023-04-09 DIAGNOSIS — R1030 Lower abdominal pain, unspecified: Secondary | ICD-10-CM | POA: Diagnosis not present

## 2023-04-09 DIAGNOSIS — R350 Frequency of micturition: Secondary | ICD-10-CM | POA: Diagnosis not present

## 2023-04-09 DIAGNOSIS — R300 Dysuria: Secondary | ICD-10-CM

## 2023-04-09 DIAGNOSIS — R8290 Unspecified abnormal findings in urine: Secondary | ICD-10-CM | POA: Diagnosis not present

## 2023-04-09 LAB — POCT URINALYSIS DIPSTICK
Glucose, UA: NEGATIVE
Ketones, UA: NEGATIVE
Nitrite, UA: NEGATIVE
Protein, UA: NEGATIVE

## 2023-04-09 NOTE — Progress Notes (Signed)
   NURSE VISIT- UTI SYMPTOMS   SUBJECTIVE:  Mariah Burnett is a 48 y.o. G0P0000 female here for UTI symptoms. She is a GYN patient. She reports cloudy urine, urinary frequency, and burning with urination and low abd pain X 4 days .  OBJECTIVE:  There were no vitals taken for this visit.  Appears well, in no apparent distress  Results for orders placed or performed in visit on 04/09/23 (from the past 24 hours)  POCT Urinalysis Dipstick   Collection Time: 04/09/23  3:13 PM  Result Value Ref Range   Color, UA     Clarity, UA     Glucose, UA Negative Negative   Bilirubin, UA     Ketones, UA neg    Spec Grav, UA     Blood, UA 3+    pH, UA     Protein, UA Negative Negative   Urobilinogen, UA     Nitrite, UA neg    Leukocytes, UA Moderate (2+) (A) Negative   Appearance     Odor      ASSESSMENT: GYN patient with UTI symptoms and negative nitrites  PLAN: Discussed with Dr. Charlotta Newton   Rx sent by provider today: No Urine culture sent Call or return to clinic prn if these symptoms worsen or fail to improve as anticipated. Continue to drink lots of water.  Follow-up: as needed   Malachy Mood  04/09/2023 3:18 PM

## 2023-04-10 ENCOUNTER — Other Ambulatory Visit: Payer: Self-pay | Admitting: Obstetrics & Gynecology

## 2023-04-10 LAB — URINALYSIS, ROUTINE W REFLEX MICROSCOPIC
Bilirubin, UA: NEGATIVE
Glucose, UA: NEGATIVE
Ketones, UA: NEGATIVE
Nitrite, UA: POSITIVE — AB
Specific Gravity, UA: 1.013 (ref 1.005–1.030)
Urobilinogen, Ur: 1 mg/dL (ref 0.2–1.0)
pH, UA: 7 (ref 5.0–7.5)

## 2023-04-10 LAB — MICROSCOPIC EXAMINATION
Casts: NONE SEEN /LPF
WBC, UA: >30 /HPF — AB (ref 0–5)

## 2023-04-10 MED ORDER — SULFAMETHOXAZOLE-TRIMETHOPRIM 800-160 MG PO TABS: 1.0000 | ORAL_TABLET | Freq: Two times a day (BID) | ORAL | 0 refills | Status: AC

## 2023-04-13 ENCOUNTER — Encounter: Payer: Self-pay | Admitting: Obstetrics & Gynecology

## 2023-04-13 LAB — URINE CULTURE

## 2023-04-15 ENCOUNTER — Other Ambulatory Visit: Payer: Self-pay | Admitting: Hematology and Oncology

## 2023-04-15 ENCOUNTER — Inpatient Hospital Stay: Payer: BC Managed Care – PPO | Attending: Hematology and Oncology

## 2023-04-15 VITALS — HR 72 | Temp 99.2°F | Resp 17

## 2023-04-15 DIAGNOSIS — Z5111 Encounter for antineoplastic chemotherapy: Secondary | ICD-10-CM | POA: Insufficient documentation

## 2023-04-15 DIAGNOSIS — C50512 Malignant neoplasm of lower-outer quadrant of left female breast: Secondary | ICD-10-CM | POA: Diagnosis not present

## 2023-04-15 MED ORDER — GOSERELIN ACETATE 3.6 MG ~~LOC~~ IMPL
3.6000 mg | DRUG_IMPLANT | Freq: Once | SUBCUTANEOUS | Status: AC
  Administered 2023-04-15: 3.6 mg via SUBCUTANEOUS
  Filled 2023-04-15: qty 3.6

## 2023-04-24 ENCOUNTER — Ambulatory Visit
Admission: RE | Admit: 2023-04-24 | Discharge: 2023-04-24 | Disposition: A | Discharge status: Home / Self Care | Payer: BC Managed Care – PPO | Source: Ambulatory Visit | Attending: Adult Health | Admitting: Adult Health

## 2023-04-24 DIAGNOSIS — Z853 Personal history of malignant neoplasm of breast: Secondary | ICD-10-CM | POA: Diagnosis not present

## 2023-04-24 DIAGNOSIS — Z08 Encounter for follow-up examination after completed treatment for malignant neoplasm: Secondary | ICD-10-CM | POA: Diagnosis not present

## 2023-04-24 DIAGNOSIS — C50512 Malignant neoplasm of lower-outer quadrant of left female breast: Secondary | ICD-10-CM

## 2023-04-28 ENCOUNTER — Other Ambulatory Visit: Payer: Self-pay | Admitting: *Deleted

## 2023-04-28 ENCOUNTER — Telehealth: Payer: Self-pay | Admitting: Hematology and Oncology

## 2023-04-28 MED ORDER — LETROZOLE 2.5 MG PO TABS: 2.5000 mg | ORAL_TABLET | Freq: Every day | ORAL | 1 refills | Status: DC

## 2023-04-28 MED ORDER — HYDROXYZINE HCL 25 MG PO TABS: 25.0000 mg | ORAL_TABLET | Freq: Three times a day (TID) | ORAL | 2 refills | Status: AC | PRN

## 2023-04-28 NOTE — Telephone Encounter (Signed)
 Confirmed with pt scheduled appt date and time

## 2023-04-28 NOTE — Progress Notes (Signed)
 Received mychart message from pt stating joint pain has improved since stopping Anastrozole .  RN reviewed with MD and verbal orders received and placed for pt to be changed to Letrozole  2.5 mg p.o tablet daily and f/u in office in 6 weeks to assess tolerance.  Prescription sent to pharmacy on file, scheduling message sent, pt educated and verbalized understanding.

## 2023-05-02 DIAGNOSIS — Z853 Personal history of malignant neoplasm of breast: Secondary | ICD-10-CM | POA: Insufficient documentation

## 2023-05-02 NOTE — Assessment & Plan Note (Signed)
 Diagnosed April 2024.  S/p lumpectomy and XRT.  Currently on anastrozole  and monthly Zoladex  injections.  Recently seen by oncology for follow-up.  Her next appointment is scheduled for June.

## 2023-05-02 NOTE — Assessment & Plan Note (Signed)
 Symptoms remain well-controlled with as needed use of Xopenex.  Pulmonary exam today is unremarkable.

## 2023-05-02 NOTE — Assessment & Plan Note (Signed)
 Symptoms remain well-controlled with metoprolol  tartrate 12.5 mg twice daily.  We will refill this medication going forward.

## 2023-05-13 ENCOUNTER — Inpatient Hospital Stay: Payer: BC Managed Care – PPO | Attending: Hematology and Oncology

## 2023-05-13 VITALS — BP 131/82 | HR 71 | Temp 98.5°F | Resp 17

## 2023-05-13 DIAGNOSIS — Z5111 Encounter for antineoplastic chemotherapy: Secondary | ICD-10-CM | POA: Diagnosis not present

## 2023-05-13 DIAGNOSIS — C50512 Malignant neoplasm of lower-outer quadrant of left female breast: Secondary | ICD-10-CM | POA: Diagnosis not present

## 2023-05-13 MED ORDER — GOSERELIN ACETATE 3.6 MG ~~LOC~~ IMPL
3.6000 mg | DRUG_IMPLANT | Freq: Once | SUBCUTANEOUS | Status: AC
  Administered 2023-05-13: 3.6 mg via SUBCUTANEOUS
  Filled 2023-05-13: qty 3.6

## 2023-06-10 ENCOUNTER — Inpatient Hospital Stay: Payer: BC Managed Care – PPO | Attending: Hematology and Oncology

## 2023-06-10 VITALS — BP 131/83 | HR 83 | Temp 97.8°F | Resp 18

## 2023-06-10 DIAGNOSIS — C50512 Malignant neoplasm of lower-outer quadrant of left female breast: Secondary | ICD-10-CM | POA: Diagnosis not present

## 2023-06-10 DIAGNOSIS — Z5111 Encounter for antineoplastic chemotherapy: Secondary | ICD-10-CM | POA: Insufficient documentation

## 2023-06-10 MED ORDER — GOSERELIN ACETATE 3.6 MG ~~LOC~~ IMPL
3.6000 mg | DRUG_IMPLANT | Freq: Once | SUBCUTANEOUS | Status: AC
  Administered 2023-06-10: 3.6 mg via SUBCUTANEOUS
  Filled 2023-06-10: qty 3.6

## 2023-06-15 ENCOUNTER — Ambulatory Visit

## 2023-06-16 ENCOUNTER — Inpatient Hospital Stay: Admitting: Hematology and Oncology

## 2023-06-16 DIAGNOSIS — C50512 Malignant neoplasm of lower-outer quadrant of left female breast: Secondary | ICD-10-CM | POA: Diagnosis not present

## 2023-06-16 DIAGNOSIS — Z170 Estrogen receptor positive status [ER+]: Secondary | ICD-10-CM | POA: Diagnosis not present

## 2023-06-16 NOTE — Assessment & Plan Note (Signed)
 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  06/06/2022:Left breast lumpectomy: IDC, 1.6 cm, grade 2, margins negative, 1/4 LN + carcinoma  06/06/2022: Oncotype: Recurrence score 3 (risk of distant recurrence 2%) Patient decided not to dissipate in OFFSET clinical trial or chemo with TC and decided on OFS with AI   Adjuvant XRT to be done in [place]   Treatment Plan: Adjuvant anti-estrogen therapy with Zoladex  with anastrozole  started 09/04/22 Return to clinic monthly for injections   12/11/2022: Bone density: T-score -1.1: Mild osteopenia: Calcium vitamin D with weightbearing exercises  Anastrozole  toxicities: Severe joint pains: Improved after stopping anastrozole . Therefore we switched her to letrozole  on 05/01/2023.  Letrozole  toxicities: Return to clinic monthly for injections and in 6 months for follow-up with me.

## 2023-06-16 NOTE — Progress Notes (Signed)
 HEMATOLOGY-ONCOLOGY TELEPHONE VISIT PROGRESS NOTE  I connected with our patient on 06/16/23 at  8:30 AM EDT by telephone and verified that I am speaking with the correct person using two identifiers.  I discussed the limitations, risks, security and privacy concerns of performing an evaluation and management service by telephone and the availability of in person appointments.  I also discussed with the patient that there may be a patient responsible charge related to this service. The patient expressed understanding and agreed to proceed.   History of Present Illness: Follow-up to discuss tolerance to letrozole  therapy  History of Present Illness She is a 48 year old female who presents for follow-up regarding joint pain management related to prior treatment with anastrozole .  When she stopped anastrozole  joint stiffness and pains have improved significantly.  She started letrozole  and is here to connect by telephone to discuss tolerance to letrozole  therapy  She is taking letrozole , which has reduced her joint pain, though some pain persists. She is considering additional symptom management options, including glucosamine.    Oncology History  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 (HCC)  04/24/2022 Initial Diagnosis   Left breast mass at 4 o'clock position 1.8 cm, 1 abnormal lymph node (positive), MRI revealed 2.3 cm mass: Biopsy: Grade 2 IDC with papillary features ER 80%, PR 100%, Ki-67 10%, HER2 0 negative   05/12/2022 Cancer Staging   Staging form: Breast, AJCC 8th Edition - Clinical stage from 05/12/2022: Stage IIA (cT2, cN1(f), cM0, G2, ER+, PR+, HER2-) - Signed by Bettejane Brownie, PA-C on 05/14/2022 Stage prefix: Initial diagnosis Method of lymph node assessment: Core biopsy Histologic grading system: 3 grade system   06/06/2022 Surgery   Left breast lumpectomy: IDC, 1.6 cm, grade 2, margins negative, 1/4 LN + carcinoma.    06/06/2022 Oncotype testing   3/2%    06/06/2022 Cancer Staging   Staging form: Breast, AJCC 8th Edition - Pathologic stage from 06/06/2022: Stage IA (pT1c, pN1a, cM0, G2, ER+, PR+, HER2-, Oncotype DX score: 3) - Signed by Percival Brace, NP on 06/19/2022 Stage prefix: Initial diagnosis Multigene prognostic tests performed: Oncotype DX Recurrence score range: Less than 11 Histologic grading system: 3 grade system    Genetic Testing   [facility] CancerNext+RenalNext Panel+RNA was Negative. Report date is 07/23/2022.  The CancerNext+RenalNext gene panel offered by [employer] includes sequencing, rearrangement analysis, and RNA analysis for the following 46 genes:   APC, ATM, AXIN2, BAP1, BARD1, BMPR1A, BRCA1, BRCA2, BRIP1, CDH1, CDK4, CDKN2A, CHEK2, DICER1, FH, FLCN, HOXB13, EPCAM, GREM1, MET, MITF, MLH1, MSH2, MSH3, MSH6, MUTYH, NF1, NTHL1, PALB2, PMS2, POLD1, POLE, PTEN, RAD51C, RAD51D, SDHA, SDHB, SDHC, SDHD, SMAD4, SMARCA4, STK11, TP53, TSC1, TSC2, and VHL.     07/23/2022 - 08/21/2022 Radiation Therapy   Left breast and lymph nodes: 4272 cGy in 16 treatments "Boost": 1000 cGy in 4 treatments    09/2022 -  Anti-estrogen oral therapy   Anastrozole      REVIEW OF SYSTEMS:   Constitutional: Denies fevers, chills or abnormal weight loss All other systems were reviewed with the patient and are negative. Observations/Objective:     Assessment Plan:  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 (HCC) 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  06/06/2022:Left breast lumpectomy: IDC, 1.6 cm, grade 2, margins negative, 1/4 LN + carcinoma  06/06/2022: Oncotype: Recurrence score 3 (risk of distant recurrence 2%) Patient decided not to dissipate in OFFSET clinical trial or chemo with TC and decided on OFS with AI   Adjuvant XRT to be done in [place]    Treatment Plan: Adjuvant anti-estrogen therapy with Zoladex  with anastrozole  started 09/04/22 Return to clinic monthly for injections   12/11/2022: Bone density: T-score -1.1: Mild osteopenia: Calcium vitamin D with weightbearing exercises  Anastrozole  toxicities: Severe joint pains: Improved after stopping anastrozole . Therefore we switched her to letrozole  on 05/01/2023.  Letrozole  toxicities: Tolerating letrozole  much better than anastrozole .  She still has joint pains but they are markedly better. We discussed with her about taking Osteo Bi-Flex for joint supplementation.  Return to clinic monthly for injections and in 6 months for follow-up with me.    I discussed the assessment and treatment plan with the patient. The patient was provided an opportunity to ask questions and all were answered. The patient agreed with the plan and demonstrated an understanding of the instructions. The patient was advised to call back or seek an in-person evaluation if the symptoms worsen or if the condition fails to improve as anticipated.   I provided 20 minutes of non-face-to-face time during this encounter.  This includes time for charting and coordination of care   Margert Sheerer, MD

## 2023-06-24 ENCOUNTER — Telehealth: Payer: Self-pay

## 2023-06-24 NOTE — Telephone Encounter (Signed)
 This therapist returned pts phone call with questions of new tightness and pulling into forearm. She reports is moving and has been using her arm a lot more than usual. Educated her about possibility of returned cording and to resume daily stretches and to wear the TG sfot she has. Also gave her info on how to order a compression sleeve from [email or web address] if she would like to pursue that. If she doesn't see improvement in about 2 weeks she may want to return to physical therapy Pt verbalized understanding of all.

## 2023-06-29 ENCOUNTER — Other Ambulatory Visit: Payer: Self-pay

## 2023-06-29 MED ORDER — METOPROLOL TARTRATE 25 MG PO TABS: 12.5000 mg | ORAL_TABLET | Freq: Two times a day (BID) | ORAL | 3 refills | Status: AC

## 2023-07-08 ENCOUNTER — Inpatient Hospital Stay: Payer: BC Managed Care – PPO | Attending: Hematology and Oncology

## 2023-07-08 VITALS — BP 118/83 | HR 75 | Resp 16

## 2023-07-08 DIAGNOSIS — C50512 Malignant neoplasm of lower-outer quadrant of left female breast: Secondary | ICD-10-CM | POA: Diagnosis not present

## 2023-07-08 DIAGNOSIS — Z5111 Encounter for antineoplastic chemotherapy: Secondary | ICD-10-CM | POA: Diagnosis not present

## 2023-07-08 MED ORDER — GOSERELIN ACETATE 3.6 MG ~~LOC~~ IMPL
3.6000 mg | DRUG_IMPLANT | Freq: Once | SUBCUTANEOUS | Status: AC
  Administered 2023-07-08: 3.6 mg via SUBCUTANEOUS
  Filled 2023-07-08: qty 3.6

## 2023-07-24 ENCOUNTER — Other Ambulatory Visit: Payer: Self-pay | Admitting: Sports Medicine

## 2023-07-24 ENCOUNTER — Other Ambulatory Visit: Payer: Self-pay | Admitting: Adult Health

## 2023-07-24 ENCOUNTER — Encounter: Payer: Self-pay | Admitting: Sports Medicine

## 2023-07-24 MED ORDER — MELOXICAM 15 MG PO TABS: 15.0000 mg | ORAL_TABLET | Freq: Every day | ORAL | 1 refills | Status: DC

## 2023-07-31 ENCOUNTER — Other Ambulatory Visit: Payer: Self-pay | Admitting: Allergy & Immunology

## 2023-07-31 DIAGNOSIS — M9905 Segmental and somatic dysfunction of pelvic region: Secondary | ICD-10-CM | POA: Diagnosis not present

## 2023-07-31 DIAGNOSIS — M9902 Segmental and somatic dysfunction of thoracic region: Secondary | ICD-10-CM | POA: Diagnosis not present

## 2023-07-31 DIAGNOSIS — M9903 Segmental and somatic dysfunction of lumbar region: Secondary | ICD-10-CM | POA: Diagnosis not present

## 2023-07-31 DIAGNOSIS — M62830 Muscle spasm of back: Secondary | ICD-10-CM | POA: Diagnosis not present

## 2023-07-31 MED ORDER — LEVOCETIRIZINE DIHYDROCHLORIDE 5 MG PO TABS: 5.0000 mg | ORAL_TABLET | Freq: Every evening | ORAL | 0 refills | Status: DC

## 2023-07-31 NOTE — Addendum Note (Signed)
 Addended by: FRANCIS ROULEAU A on: 07/31/2023 01:46 PM   Modules accepted: Orders

## 2023-08-05 ENCOUNTER — Inpatient Hospital Stay: Payer: BC Managed Care – PPO

## 2023-08-05 VITALS — BP 122/80 | HR 64 | Temp 98.2°F | Resp 16

## 2023-08-05 DIAGNOSIS — Z170 Estrogen receptor positive status [ER+]: Secondary | ICD-10-CM

## 2023-08-05 DIAGNOSIS — C50512 Malignant neoplasm of lower-outer quadrant of left female breast: Secondary | ICD-10-CM | POA: Diagnosis not present

## 2023-08-05 DIAGNOSIS — Z5111 Encounter for antineoplastic chemotherapy: Secondary | ICD-10-CM | POA: Diagnosis not present

## 2023-08-05 MED ORDER — GOSERELIN ACETATE 3.6 MG ~~LOC~~ IMPL
3.6000 mg | DRUG_IMPLANT | Freq: Once | SUBCUTANEOUS | Status: AC
  Administered 2023-08-05: 3.6 mg via SUBCUTANEOUS
  Filled 2023-08-05: qty 3.6

## 2023-08-10 DIAGNOSIS — M9905 Segmental and somatic dysfunction of pelvic region: Secondary | ICD-10-CM | POA: Diagnosis not present

## 2023-08-10 DIAGNOSIS — M9903 Segmental and somatic dysfunction of lumbar region: Secondary | ICD-10-CM | POA: Diagnosis not present

## 2023-08-10 DIAGNOSIS — M9902 Segmental and somatic dysfunction of thoracic region: Secondary | ICD-10-CM | POA: Diagnosis not present

## 2023-08-10 DIAGNOSIS — M62830 Muscle spasm of back: Secondary | ICD-10-CM | POA: Diagnosis not present

## 2023-08-17 ENCOUNTER — Encounter: Payer: Self-pay | Admitting: Internal Medicine

## 2023-08-17 ENCOUNTER — Ambulatory Visit: Admitting: Internal Medicine

## 2023-08-17 VITALS — BP 110/76 | HR 75 | Temp 97.8°F | Ht 65.5 in | Wt 163.0 lb

## 2023-08-17 DIAGNOSIS — J3089 Other allergic rhinitis: Secondary | ICD-10-CM

## 2023-08-17 DIAGNOSIS — J4520 Mild intermittent asthma, uncomplicated: Secondary | ICD-10-CM

## 2023-08-17 DIAGNOSIS — J302 Other seasonal allergic rhinitis: Secondary | ICD-10-CM | POA: Diagnosis not present

## 2023-08-17 MED ORDER — LEVOCETIRIZINE DIHYDROCHLORIDE 5 MG PO TABS: 5.0000 mg | ORAL_TABLET | Freq: Every evening | ORAL | 5 refills | Status: DC

## 2023-08-17 MED ORDER — AZELASTINE HCL 0.1 % NA SOLN
2.0000 | Freq: Two times a day (BID) | NASAL | 5 refills | Status: AC | PRN
Start: 2023-08-17 — End: ?

## 2023-08-17 MED ORDER — ALBUTEROL SULFATE HFA 108 (90 BASE) MCG/ACT IN AERS: 2.0000 | INHALATION_SPRAY | Freq: Four times a day (QID) | RESPIRATORY_TRACT | 1 refills | Status: DC | PRN

## 2023-08-17 NOTE — Progress Notes (Signed)
 FOLLOW UP Date of Service/Encounter:  08/17/23   Subjective:  Mariah Burnett (DOB: 07-10-1975) is a 48 y.o. female who [facility] for follow up for asthma and mixed rhinitis.   History obtained from: chart review and patient.  Last seen by Dr Iva on 03/07/2022 with controlled asthma on PRN Albuterol  and on AIT/Azelastine /Xyzal  for allergic rhinitis, also with gustatory rhinitis. Plan to do AIT until 03/2024  Asthma has done well overall.  Rarely needs her albuterol  inhaler.  Did require it last Fall when she had COVID for about a week.  Denies any ER visits/oral prednisone use in the last year.  Having trouble with congestion, runny nose and having to use tissues a lot resulting in nosebleeds and scabs. Interested in restarting AIT.  Had breast cancer and now in remission.  On beta blocker for palpitations, no hx of CAD. Taking Clarinex which is not as helpful as Xyzal .  Also uses Azelastine  PRN.   Past Medical History: Past Medical History:  Diagnosis Date   Arthritis    Asthma    Breast cancer (HCC) 04/24/2022   MVP (mitral valve prolapse)    Although no evidence per echo in 2009. Trace MR   Palpitation    Scoliosis    Urticaria    Vaginal Pap smear, abnormal     Objective:  BP 110/76   Pulse 75   Temp 97.8 F (36.6 C)   Ht 5' 5.5 (1.664 m)   Wt 163 lb (73.9 kg)   SpO2 99%   BMI 26.71 kg/m  Body mass index is 26.71 kg/m. Physical Exam: GEN: alert, well developed HEENT: clear conjunctiva, nose with mild inferior turbinate hypertrophy, pink nasal mucosa, + clear rhinorrhea HEART: regular rate and rhythm, no murmur LUNGS: clear to auscultation bilaterally, no coughing, unlabored respiration SKIN: no rashes or lesions  Spirometry:  Tracings reviewed. Her effort: Good reproducible efforts. FVC: 3.03L, 83% predicted  FEV1: 2.5L, 86% predicted FEV1/FVC ratio: 83% Interpretation: Spirometry consistent with normal  pattern.  Please see scanned spirometry results for details.  Assessment:   1. Seasonal and perennial allergic rhinitis   2. Mild intermittent asthma without complication     Plan/Recommendations:   Mild Persistent Asthma - Well controlled with normal spirometry. - Rescue medications: Albuterol  1-2 puffs every 4-6 hours as needed for shortness of breath, wheezing, cough. - Asthma control goals:  * Full participation in all desired activities (may need albuterol  before activity) * Albuterol  use two time or less a week on average (not counting use with activity) * Cough interfering with sleep two time or less a month * Oral steroids no more than once a year * No hospitalizations  Allergic Rhinitis  - Uncontrolled, restart Xyzal , stop Clarinex. Will retest and consider AIT.  - SPT 06/2018 positive torabbit, grasses, ragweed, trees, indoor molds, outdoor molds, dust mites, cat, dog and cockroach - Use nasal saline rinses before nose sprays such as with Neilmed Sinus Rinse.  Use distilled water.   - Use Azelastine  2 sprays each nostril twice daily. Aim upward and outward. - Use Xyzal  (Levocetirizine) 5 mg daily.  - Consider allergy  shots as long term control of your symptoms by teaching your immune system to be more tolerant of your allergy  triggers.  She is on a beta blocker for heart palpitations and breast cancer is in remission. Would be interested in AIT.   Hold all anti-histamines (Xyzal , Allegra, Zyrtec, Clarinex, Claritin, Benadryl, Pepcid) 3 days  prior to next visit.  Follow up: 830 AM on 8/25 for skin testing 1-55, IDs okay   Arleta Blanch, MD [facility] 

## 2023-08-17 NOTE — Patient Instructions (Addendum)
 Mild Persistent Asthma - Rescue medications: Albuterol  1-2 puffs every 4-6 hours as needed for shortness of breath, wheezing, cough. - Asthma control goals:  * Full participation in all desired activities (may need albuterol  before activity) * Albuterol  use two time or less a week on average (not counting use with activity) * Cough interfering with sleep two time or less a month * Oral steroids no more than once a year * No hospitalizations  Allergic Rhinitis  - SPT 06/2018 positive torabbit, grasses, ragweed, trees, indoor molds, outdoor molds, dust mites, cat, dog and cockroach - Use nasal saline rinses before nose sprays such as with Neilmed Sinus Rinse.  Use distilled water.   - Use Azelastine  2 sprays each nostril twice daily. Aim upward and outward. - Use Xyzal  (Levocetirizine) 5 mg daily.  - Consider allergy  shots as long term control of your symptoms by teaching your immune system to be more tolerant of your allergy  triggers.  She is on a beta blocker for heart palpitations and breast cancer is in remission.   Hold all anti-histamines (Xyzal , Allegra, Zyrtec, Clarinex, Claritin, Benadryl, Pepcid) 3 days prior to next visit.   Follow up: 830 AM on 8/25 for skin testing 1-55

## 2023-08-18 ENCOUNTER — Other Ambulatory Visit: Payer: Self-pay | Admitting: Internal Medicine

## 2023-08-25 ENCOUNTER — Telehealth: Payer: Self-pay | Admitting: Internal Medicine

## 2023-08-25 NOTE — Telephone Encounter (Signed)
 Phx called to advise Prior Auth required for levoceterizine, provided following info to call back: [phone] option 2, and the Ref # is [id number]

## 2023-08-26 ENCOUNTER — Other Ambulatory Visit (HOSPITAL_COMMUNITY): Payer: Self-pay

## 2023-08-26 ENCOUNTER — Encounter: Payer: Self-pay | Admitting: Hematology and Oncology

## 2023-08-31 ENCOUNTER — Ambulatory Visit (INDEPENDENT_AMBULATORY_CARE_PROVIDER_SITE_OTHER): Admitting: Internal Medicine

## 2023-08-31 DIAGNOSIS — J301 Allergic rhinitis due to pollen: Secondary | ICD-10-CM

## 2023-08-31 DIAGNOSIS — J4520 Mild intermittent asthma, uncomplicated: Secondary | ICD-10-CM | POA: Diagnosis not present

## 2023-08-31 DIAGNOSIS — J3081 Allergic rhinitis due to animal (cat) (dog) hair and dander: Secondary | ICD-10-CM

## 2023-08-31 DIAGNOSIS — J3089 Other allergic rhinitis: Secondary | ICD-10-CM | POA: Diagnosis not present

## 2023-08-31 MED ORDER — FLUTICASONE PROPIONATE 50 MCG/ACT NA SUSP
2.0000 | Freq: Every day | NASAL | 5 refills | Status: DC
Start: 2023-08-31 — End: 2023-10-28

## 2023-08-31 NOTE — Patient Instructions (Addendum)
 Mild Persistent Asthma - Rescue medications: Albuterol  1-2 puffs every 4-6 hours as needed for shortness of breath, wheezing, cough. - Asthma control goals:  * Full participation in all desired activities (may need albuterol  before activity) * Albuterol  use two time or less a week on average (not counting use with activity) * Cough interfering with sleep two time or less a month * Oral steroids no more than once a year * No hospitalizations  Allergic Rhinitis  - SPT 08/2023: trees, molds, cats, cockroach  - Use nasal saline rinses before nose sprays such as with Neilmed Sinus Rinse.  Use distilled water.   - Use Flonase  2 sprays each nostril daily.  Aim upward and outward.   - Use Azelastine  2 sprays each nostril twice daily. Aim upward and outward. - Use Xyzal  (Levocetirizine) 5 mg daily.  - Consider allergy  shots as long term control of your symptoms by teaching your immune system to be more tolerant of your allergy  triggers.  She is on a beta blocker for heart palpitations and breast cancer is in remission.   ALLERGEN AVOIDANCE MEASURES   Molds - Indoor avoidance Use air conditioning to reduce indoor humidity.  Do not use a humidifier. Keep indoor humidity at 30 - 40%.  Use a dehumidifier if needed. In the bathroom use an exhaust fan or open a window after showering.  Wipe down damp surfaces after showering.  Clean bathrooms with a mold-killing solution (diluted bleach, or products like Tilex, etc) at least once a month. In the kitchen use an exhaust fan to remove steam from cooking.  Throw away spoiled foods immediately, and empty garbage daily.  Empty water pans below self-defrosting refrigerators frequently. Vent the clothes dryer to the outside. Limit indoor houseplants; mold grows in the dirt.  No houseplants in the bedroom. Remove carpet from the bedroom. Encase the mattress and box springs with a zippered encasing.  Molds - Outdoor avoidance Avoid being outside when the grass  is being mowed, or the ground is tilled. Avoid playing in leaves, pine straw, hay, etc.  Dead plant materials contain mold. Avoid going into barns or grain storage areas. Remove leaves, clippings and compost from around the home.  Cockroach Limit spread of food around the house; especially keep food out of bedrooms. Keep food and garbage in closed containers with a tight lid.  Never leave food out in the kitchen.  Do not leave out pet food or dirty food bowls. Mop the kitchen floor and wash countertops at least once a week. Repair leaky pipes and faucets so there is no standing water to attract roaches. Plug up cracks in the house through which cockroaches can enter. Use bait stations and approved pesticides to reduce cockroach infestation. Pollen Avoidance Pollen levels are highest during the mid-day and afternoon.  Consider this when planning outdoor activities. Avoid being outside when the grass is being mowed, or wear a mask if the pollen-allergic person must be the one to mow the grass. Keep the windows closed to keep pollen outside of the home. Use an air conditioner to filter the air. Take a shower, wash hair, and change clothing after working or playing outdoors during pollen season. Pet Dander- Cats Keep the pet out of your bedroom and restrict it to only a few rooms. Be advised that keeping the pet in only one room will not limit the allergens to that room. Don't pet, hug or kiss the pet; if you do, wash your hands with soap and water.  High-efficiency particulate air (HEPA) cleaners run continuously in a bedroom or living room can reduce allergen levels over time. Regular use of a high-efficiency vacuum cleaner or a central vacuum can reduce allergen levels. Giving your pet a bath at least once a week can reduce airborne allergen.

## 2023-08-31 NOTE — Progress Notes (Signed)
 FOLLOW UP Date of Service/Encounter:  08/31/23   Subjective:  Mariah Burnett (DOB: 1975/04/17) is a 48 y.o. female who [facility] for follow up for skin testing.   History obtained from: chart review and patient.  Anti histamines held.   Past Medical History: Past Medical History:  Diagnosis Date   Arthritis    Asthma    Breast cancer (HCC) 04/24/2022   MVP (mitral valve prolapse)    Although no evidence per echo in 2009. Trace MR   Palpitation    Scoliosis    Urticaria    Vaginal Pap smear, abnormal     Objective:  There were no vitals taken for this visit. There is no height or weight on file to calculate BMI. Physical Exam: GEN: alert, well developed HEENT: clear conjunctiva, MMM LUNGS: unlabored respiration  Skin Testing:  Skin prick testing was placed, which includes aeroallergens/foods, histamine control, and saline control.  Verbal consent was obtained prior to placing test.  Patient tolerated procedure well.  Allergy  testing results were read and interpreted by myself, documented by clinical staff. Adequate positive and negative control.  Positive results to:  Results discussed with patient/family.  Airborne Adult Perc - 08/31/23 0834     Allergen Manufacturer Jestine    Location Back    Number of Test 55    1. Control-Buffer 50% Glycerol Negative    2. Control-Histamine 3+    3. Bahia Negative    4. French Southern Territories Negative    5. Johnson Negative    6. [place]  Blue Negative    7. Meadow Fescue Negative    8. Perennial Rye Negative    9. Timothy Negative    10. Ragweed Mix Negative    11. Cocklebur Negative    12. Plantain,  English Negative    13. Baccharis Negative    14. Dog Fennel Negative    15. Russian Thistle Negative    16. Lamb's Quarters Negative    17. Sheep Sorrell Negative    18. Rough Pigweed Negative    19. Marsh Elder, Rough Negative    20. Mugwort, Common Negative    21. Box, Elder Negative     22. Cedar, red Negative    23. Sweet Gum Negative    24. Pecan Pollen Negative    25. Pine Mix Negative    26. Walnut, Black Pollen Negative    27. Red Mulberry Negative    28. Ash Mix Negative    29. Birch Mix Negative    30. Beech American Negative    32. Hickory, White Negative    33. Maple Mix Negative    34. Oak, Guinea-Bissau Mix Negative    35. Sycamore Eastern Negative    36. Alternaria Alternata Negative    37. Cladosporium Herbarum Negative    38. Aspergillus Mix Negative    39. Penicillium Mix Negative    40. Bipolaris Sorokiniana (Helminthosporium) Negative    41. Drechslera Spicifera (Curvularia) Negative    42. Mucor Plumbeus Negative    43. Fusarium Moniliforme Negative    44. Aureobasidium Pullulans (pullulara) Negative    45. Rhizopus Oryzae Negative    46. Botrytis Cinera Negative    47. Epicoccum Nigrum Negative    48. Phoma Betae Negative    49. Dust Mite Mix Negative    50. Cat Hair 10,000 BAU/ml Negative    51.  Dog Epithelia Negative    52. Mixed Feathers Negative  53. Horse Epithelia Negative    54. Cockroach, German Negative    55. Tobacco Leaf Negative          Intradermal - 08/31/23 0955     Time Antigen Placed 9044    Allergen Manufacturer Jestine    Location Arm    Number of Test 16    Control Negative    Bahia Negative    French Southern Territories Negative    Johnson Negative    7 Grass Negative    Ragweed Mix Negative    Weed Mix Negative    Tree Mix 2+    Mold 1 Negative    Mold 2 Negative    Mold 3 Negative    Mold 4 3+    Mite Mix Negative    Cat 3+    Dog Negative    Cockroach 3+           Assessment:   1. Seasonal allergic rhinitis due to pollen   2. Mild intermittent asthma without complication   3. Allergic rhinitis caused by mold   4. Allergic rhinitis due to animal hair or dander   5. Allergic rhinitis due to insect     Plan/Recommendations:   Allergic Rhinitis  - Due to turbinate hypertrophy, unresponsive to OTC meds,  asthma and persistent symptoms despite hx of allergen immunotherapy, will plan to do skin testing today.  - SPT 08/2023: trees, molds, cats, cockroach  - Use nasal saline rinses before nose sprays such as with Neilmed Sinus Rinse.  Use distilled water.   - Use Flonase  2 sprays each nostril daily.  Aim upward and outward.   - Use Azelastine  2 sprays each nostril twice daily. Aim upward and outward. - Use Xyzal  (Levocetirizine) 5 mg daily.  - Consider allergy  shots as long term control of your symptoms by teaching your immune system to be more tolerant of your allergy  triggers.  She is on a beta blocker for heart palpitations and breast cancer is in remission. Previously done AIT from 09/2018-08/2022.    Mild Persistent Asthma - Rescue medications: Albuterol  1-2 puffs every 4-6 hours as needed for shortness of breath, wheezing, cough. - Asthma control goals:  * Full participation in all desired activities (may need albuterol  before activity) * Albuterol  use two time or less a week on average (not counting use with activity) * Cough interfering with sleep two time or less a month * Oral steroids no more than once a year * No hospitalizations    ALLERGEN AVOIDANCE MEASURES   Molds - Indoor avoidance Use air conditioning to reduce indoor humidity.  Do not use a humidifier. Keep indoor humidity at 30 - 40%.  Use a dehumidifier if needed. In the bathroom use an exhaust fan or open a window after showering.  Wipe down damp surfaces after showering.  Clean bathrooms with a mold-killing solution (diluted bleach, or products like Tilex, etc) at least once a month. In the kitchen use an exhaust fan to remove steam from cooking.  Throw away spoiled foods immediately, and empty garbage daily.  Empty water pans below self-defrosting refrigerators frequently. Vent the clothes dryer to the outside. Limit indoor houseplants; mold grows in the dirt.  No houseplants in the bedroom. Remove carpet from the  bedroom. Encase the mattress and box springs with a zippered encasing.  Molds - Outdoor avoidance Avoid being outside when the grass is being mowed, or the ground is tilled. Avoid playing in leaves, pine straw, hay, etc.  Dead plant  materials contain mold. Avoid going into barns or grain storage areas. Remove leaves, clippings and compost from around the home.  Cockroach Limit spread of food around the house; especially keep food out of bedrooms. Keep food and garbage in closed containers with a tight lid.  Never leave food out in the kitchen.  Do not leave out pet food or dirty food bowls. Mop the kitchen floor and wash countertops at least once a week. Repair leaky pipes and faucets so there is no standing water to attract roaches. Plug up cracks in the house through which cockroaches can enter. Use bait stations and approved pesticides to reduce cockroach infestation. Pollen Avoidance Pollen levels are highest during the mid-day and afternoon.  Consider this when planning outdoor activities. Avoid being outside when the grass is being mowed, or wear a mask if the pollen-allergic person must be the one to mow the grass. Keep the windows closed to keep pollen outside of the home. Use an air conditioner to filter the air. Take a shower, wash hair, and change clothing after working or playing outdoors during pollen season. Pet Dander- Cats Keep the pet out of your bedroom and restrict it to only a few rooms. Be advised that keeping the pet in only one room will not limit the allergens to that room. Don't pet, hug or kiss the pet; if you do, wash your hands with soap and water. High-efficiency particulate air (HEPA) cleaners run continuously in a bedroom or living room can reduce allergen levels over time. Regular use of a high-efficiency vacuum cleaner or a central vacuum can reduce allergen levels. Giving your pet a bath at least once a week can reduce airborne allergen.    Return in  about 6 months (around 03/02/2024).  Arleta Blanch, MD [facility] 

## 2023-09-02 ENCOUNTER — Other Ambulatory Visit: Payer: Self-pay | Admitting: Hematology and Oncology

## 2023-09-02 ENCOUNTER — Inpatient Hospital Stay: Payer: BC Managed Care – PPO | Attending: Hematology and Oncology

## 2023-09-02 VITALS — BP 113/84 | HR 67 | Resp 16

## 2023-09-02 DIAGNOSIS — Z5111 Encounter for antineoplastic chemotherapy: Secondary | ICD-10-CM | POA: Diagnosis not present

## 2023-09-02 DIAGNOSIS — C50512 Malignant neoplasm of lower-outer quadrant of left female breast: Secondary | ICD-10-CM | POA: Insufficient documentation

## 2023-09-02 DIAGNOSIS — Z170 Estrogen receptor positive status [ER+]: Secondary | ICD-10-CM

## 2023-09-02 MED ORDER — GOSERELIN ACETATE 3.6 MG ~~LOC~~ IMPL
3.6000 mg | DRUG_IMPLANT | Freq: Once | SUBCUTANEOUS | Status: AC
  Administered 2023-09-02: 3.6 mg via SUBCUTANEOUS
  Filled 2023-09-02: qty 3.6

## 2023-09-04 ENCOUNTER — Telehealth: Payer: Self-pay | Admitting: Internal Medicine

## 2023-09-04 NOTE — Telephone Encounter (Signed)
 CARE PA IS NEEDED FOR FLUTICASONE   AUTHORIZATION [id number]// PHARMACY [phone] OPTION 2 REF# [id number]

## 2023-09-08 ENCOUNTER — Other Ambulatory Visit (HOSPITAL_COMMUNITY): Payer: Self-pay

## 2023-09-08 ENCOUNTER — Telehealth: Payer: Self-pay

## 2023-09-08 NOTE — Telephone Encounter (Signed)
 Pharmacy Patient Advocate Encounter   Received notification from Pt Calls Messages that prior authorization for Flonase  nasal spray is required/requested.   Insurance verification completed.   The patient is insured through [facility] .   Per test claim: PLAN EXCLUSION

## 2023-09-22 DIAGNOSIS — C50519 Malignant neoplasm of lower-outer quadrant of unspecified female breast: Secondary | ICD-10-CM | POA: Diagnosis not present

## 2023-09-29 ENCOUNTER — Encounter: Payer: Self-pay | Admitting: Hematology and Oncology

## 2023-09-30 ENCOUNTER — Inpatient Hospital Stay: Payer: BC Managed Care – PPO | Attending: Hematology and Oncology

## 2023-09-30 VITALS — BP 121/94 | HR 71 | Temp 98.4°F | Resp 17

## 2023-09-30 DIAGNOSIS — C50512 Malignant neoplasm of lower-outer quadrant of left female breast: Secondary | ICD-10-CM | POA: Diagnosis not present

## 2023-09-30 DIAGNOSIS — Z170 Estrogen receptor positive status [ER+]: Secondary | ICD-10-CM

## 2023-09-30 DIAGNOSIS — Z5111 Encounter for antineoplastic chemotherapy: Secondary | ICD-10-CM | POA: Diagnosis not present

## 2023-09-30 MED ORDER — GOSERELIN ACETATE 3.6 MG ~~LOC~~ IMPL
3.6000 mg | DRUG_IMPLANT | Freq: Once | SUBCUTANEOUS | Status: AC
  Administered 2023-09-30: 3.6 mg via SUBCUTANEOUS
  Filled 2023-09-30: qty 3.6

## 2023-10-08 ENCOUNTER — Other Ambulatory Visit

## 2023-10-09 ENCOUNTER — Other Ambulatory Visit: Payer: Self-pay | Admitting: Sports Medicine

## 2023-10-09 ENCOUNTER — Other Ambulatory Visit: Payer: Self-pay | Admitting: Hematology and Oncology

## 2023-10-10 ENCOUNTER — Other Ambulatory Visit

## 2023-10-10 ENCOUNTER — Ambulatory Visit
Admission: RE | Admit: 2023-10-10 | Discharge: 2023-10-10 | Disposition: A | Discharge status: Home / Self Care | Source: Ambulatory Visit | Attending: Adult Health | Admitting: Adult Health

## 2023-10-10 DIAGNOSIS — N6489 Other specified disorders of breast: Secondary | ICD-10-CM | POA: Diagnosis not present

## 2023-10-10 DIAGNOSIS — Z170 Estrogen receptor positive status [ER+]: Secondary | ICD-10-CM

## 2023-10-10 DIAGNOSIS — Z9189 Other specified personal risk factors, not elsewhere classified: Secondary | ICD-10-CM

## 2023-10-10 MED ORDER — GADOPICLENOL 0.5 MMOL/ML IV SOLN
7.0000 mL | Freq: Once | INTRAVENOUS | Status: AC | PRN
  Administered 2023-10-10: 7 mL via INTRAVENOUS

## 2023-10-12 ENCOUNTER — Ambulatory Visit: Payer: Self-pay

## 2023-10-28 ENCOUNTER — Inpatient Hospital Stay: Payer: BC Managed Care – PPO | Attending: Hematology and Oncology | Admitting: Adult Health

## 2023-10-28 ENCOUNTER — Inpatient Hospital Stay: Payer: BC Managed Care – PPO

## 2023-10-28 VITALS — BP 124/88 | HR 80 | Temp 98.6°F | Resp 18

## 2023-10-28 VITALS — Ht 65.5 in | Wt 168.7 lb

## 2023-10-28 DIAGNOSIS — Z5111 Encounter for antineoplastic chemotherapy: Secondary | ICD-10-CM | POA: Diagnosis not present

## 2023-10-28 DIAGNOSIS — Z170 Estrogen receptor positive status [ER+]: Secondary | ICD-10-CM | POA: Insufficient documentation

## 2023-10-28 DIAGNOSIS — M8580 Other specified disorders of bone density and structure, unspecified site: Secondary | ICD-10-CM | POA: Insufficient documentation

## 2023-10-28 DIAGNOSIS — C50512 Malignant neoplasm of lower-outer quadrant of left female breast: Secondary | ICD-10-CM | POA: Insufficient documentation

## 2023-10-28 DIAGNOSIS — Z9189 Other specified personal risk factors, not elsewhere classified: Secondary | ICD-10-CM

## 2023-10-28 MED ORDER — GOSERELIN ACETATE 3.6 MG ~~LOC~~ IMPL
3.6000 mg | DRUG_IMPLANT | Freq: Once | SUBCUTANEOUS | Status: AC
  Administered 2023-10-28: 3.6 mg via SUBCUTANEOUS
  Filled 2023-10-28: qty 3.6

## 2023-10-28 MED ORDER — DICLOFENAC SODIUM 3 % EX GEL: 1.0000 | Freq: Two times a day (BID) | CUTANEOUS | 0 refills | Status: AC | PRN

## 2023-10-28 NOTE — Progress Notes (Signed)
 [facility] Cancer Follow up:    No primary care provider on file. No primary provider on file.   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 [facility]) Staging form: Breast, AJCC 8th Edition - Clinical stage from 05/12/2022: Stage IIA (cT2, cN1(f), cM0, G2, ER+, PR+, HER2-) - Signed by Lanell Donald Stagger, PA-C on 05/14/2022 Stage prefix: Initial diagnosis Method of lymph node assessment: Core biopsy Histologic grading system: 3 grade system - Pathologic stage from 06/06/2022: Stage IA (pT1c, pN1a, cM0, G2, ER+, PR+, HER2-, Oncotype DX score: 3) - Signed by Crawford Morna Pickle, NP on 06/19/2022 Stage prefix: Initial diagnosis Multigene prognostic tests performed: Oncotype DX Recurrence score range: Less than 11 Histologic grading system: 3 grade system    SUMMARY OF ONCOLOGIC HISTORY: Oncology History  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 (HCC)  04/24/2022 Initial Diagnosis   Left breast mass at 4 o'clock position 1.8 cm, 1 abnormal lymph node (positive), MRI revealed 2.3 cm mass: Biopsy: Grade 2 IDC with papillary features ER 80%, PR 100%, Ki-67 10%, HER2 0 negative   05/12/2022 Cancer Staging   Staging form: Breast, AJCC 8th Edition - Clinical stage from 05/12/2022: Stage IIA (cT2, cN1(f), cM0, G2, ER+, PR+, HER2-) - Signed by Lanell Donald Stagger, PA-C on 05/14/2022 Stage prefix: Initial diagnosis Method of lymph node assessment: Core biopsy Histologic grading system: 3 grade system   06/06/2022 Surgery   Left breast lumpectomy: IDC, 1.6 cm, grade 2, margins negative, 1/4 LN + carcinoma.    06/06/2022 Oncotype testing   3/2%   06/06/2022 Cancer Staging   Staging form: Breast, AJCC 8th Edition - Pathologic stage from 06/06/2022: Stage IA (pT1c, pN1a, cM0, G2, ER+, PR+, HER2-, Oncotype DX score: 3) - Signed by Crawford Morna Pickle, NP on 06/19/2022 Stage prefix: Initial diagnosis Multigene  prognostic tests performed: Oncotype DX Recurrence score range: Less than 11 Histologic grading system: 3 grade system    Genetic Testing   [facility] CancerNext+RenalNext Panel+RNA was Negative. Report date is 07/23/2022.  The CancerNext+RenalNext gene panel offered by [employer] includes sequencing, rearrangement analysis, and RNA analysis for the following 46 genes:   APC, ATM, AXIN2, BAP1, BARD1, BMPR1A, BRCA1, BRCA2, BRIP1, CDH1, CDK4, CDKN2A, CHEK2, DICER1, FH, FLCN, HOXB13, EPCAM, GREM1, MET, MITF, MLH1, MSH2, MSH3, MSH6, MUTYH, NF1, NTHL1, PALB2, PMS2, POLD1, POLE, PTEN, RAD51C, RAD51D, SDHA, SDHB, SDHC, SDHD, SMAD4, SMARCA4, STK11, TP53, TSC1, TSC2, and VHL.     07/23/2022 - 08/21/2022 Radiation Therapy   Left breast and lymph nodes: 4272 cGy in 16 treatments "Boost": 1000 cGy in 4 treatments    09/2022 -  Anti-estrogen oral therapy   Anastrozole      CURRENT THERAPY: Letrozole /Zoladex   INTERVAL HISTORY:  Mariah Burnett is feeling well today.  She continues on Letrozole  and zoladex  with good tolerance. She notes some mild hot flashes and arthalgias. She has started eating better and is excited about her new meal plan.  She is exercising regularly.    Her most recent bilateral breast mammogram occurred on 04/24/2023 and demonstrated no mammographic evidence of malignancy and breast density category B.  She also undergoes annual breast MRI due to age at diagnosis (less than 50) and breast density.  Breast MRI occurred on 10/12/2023 demonstrating no evidence of malignancy and breast density category C.    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  MEDICAL HISTORY: Past Medical History:  Diagnosis Date   Arthritis    Asthma    Breast cancer (HCC) 04/24/2022   MVP (mitral valve prolapse)    Although no evidence per echo in 2009. Trace MR   Palpitation    Scoliosis    Urticaria    Vaginal Pap smear, abnormal     SURGICAL HISTORY: Past Surgical History:  Procedure Laterality Date   BREAST BIOPSY Left 04/24/2022   US  LT BREAST BX W LOC DEV 1ST LESION IMG BX SPEC US  GUIDE 04/24/2022 [facility]   BREAST BIOPSY Left 04/24/2022   US  LT BREAST BX W LOC DEV EA ADD LESION IMG BX SPEC US  GUIDE 04/24/2022 [facility]   BREAST BIOPSY Left 06/04/2022   US  LT RADIOACTIVE SEED LOC 06/04/2022 [facility]   BREAST BIOPSY Left 06/04/2022   US  LT RADIOACTIVE SEED EA ADD LESION 06/04/2022 [facility]   BREAST LUMPECTOMY WITH RADIOACTIVE SEED AND SENTINEL LYMPH NODE BIOPSY Left 06/06/2022   Procedure: LEFT BREAST LUMPECTOMY WITH RAADIOACTIVE SEED AND SENTINEL LYMPH NODE BIOPSY;  Surgeon: Curvin Deward MOULD, MD;  Location: [facility];  Service: General;  Laterality: Left;   KIDNEY STONE SURGERY Bilateral 2000   RADIOACTIVE SEED GUIDED AXILLARY SENTINEL LYMPH NODE Left 06/06/2022   Procedure: RADIOACTIVE SEED GUIDED LEFT AXILLARY SENTINEL LYMPH NODE DISSECTION;  Surgeon: Curvin Deward MOULD, MD;  Location: [facility];  Service: General;  Laterality: Left;   WISDOM TOOTH EXTRACTION Bilateral 1991    SOCIAL HISTORY: Social History   Socioeconomic History   Marital status:  Divorced    Spouse name: Not on file   Number of children: Not on file   Years of education: Not on file   Highest education level: Not on file  Occupational History   Not on file  Tobacco Use   Smoking status: Never   Smokeless tobacco: Never  Vaping Use   Vaping status: Never Used  Substance and Sexual Activity   Alcohol use: Yes    Comment: occ   Drug use: No   Sexual activity: Yes    Birth control/protection: None  Other Topics Concern   Not on file  Social History Narrative   Not on file   Social Drivers of Health   Financial Resource Strain: Low Risk  (03/05/2021)   Overall Financial Resource Strain (CARDIA)    Difficulty of Paying Living Expenses: Not very hard  Food Insecurity: Food Insecurity Present (03/05/2021)   Hunger Vital Sign    Worried About Running Out of Food in the Last Year: Sometimes true    Ran Out of Food in the Last Year: Sometimes true  Transportation Needs: No Transportation Needs (03/05/2021)   PRAPARE - Administrator, Civil Service (Medical): No  Lack of Transportation (Non-Medical): No  Physical Activity: Sufficiently Active (03/05/2021)   Exercise Vital Sign    Days of Exercise per Week: 7 days    Minutes of Exercise per Session: 60 min  Stress: No Stress Concern Present (03/05/2021)   [employer] of Occupational Health - Occupational Stress Questionnaire    Feeling of Stress : Not at all  Social Connections: Moderately Isolated (03/05/2021)   Social Connection and Isolation Panel    Frequency of Communication with Friends and Family: Once a week    Frequency of Social Gatherings with Friends and Family: Once a week    Attends Religious Services: More than 4 times per year    Active Member of [employer] or Organizations: Yes    Attends Banker Meetings: More than 4 times per year    Marital Status: Separated  Intimate Partner Violence: Not At Risk (03/05/2021)   Humiliation, Afraid, Rape, and Kick questionnaire     Fear of Current or Ex-Partner: No    Emotionally Abused: No    Physically Abused: No    Sexually Abused: No    FAMILY HISTORY: Family History  Problem Relation Age of Onset   Heart disease Mother    Heart attack Mother    Breast cancer Mother 82   Urticaria Mother    Renal cancer Father 72       metastatic   Breast cancer Maternal Aunt 40   Breast cancer Maternal Aunt 55   Breast cancer Paternal Grandmother 50   Lung cancer Paternal Grandfather        smoked   Emphysema Paternal Grandfather     Review of Systems  Constitutional:  Negative for appetite change, chills, fatigue, fever and unexpected weight change.  HENT:   Negative for hearing loss, lump/mass and trouble swallowing.   Eyes:  Negative for eye problems and icterus.  Respiratory:  Negative for chest tightness, cough and shortness of breath.   Cardiovascular:  Negative for chest pain, leg swelling and palpitations.  Gastrointestinal:  Negative for abdominal distention, abdominal pain, constipation, diarrhea, nausea and vomiting.  Endocrine: Negative for hot flashes.  Genitourinary:  Negative for difficulty urinating.   Musculoskeletal:  Negative for arthralgias.  Skin:  Negative for itching and rash.  Neurological:  Negative for dizziness, extremity weakness, headaches and numbness.  Hematological:  Negative for adenopathy. Does not bruise/bleed easily.  Psychiatric/Behavioral:  Negative for depression. The patient is not nervous/anxious.       PHYSICAL EXAMINATION    There were no vitals filed for this visit.  Physical Exam Constitutional:      General: She is not in acute distress.    Appearance: Normal appearance. She is not toxic-appearing.  HENT:     Head: Normocephalic and atraumatic.     Mouth/Throat:     Mouth: Mucous membranes are moist.     Pharynx: Oropharynx is clear. No oropharyngeal exudate or posterior oropharyngeal erythema.  Eyes:     General: No scleral icterus. Cardiovascular:      Rate and Rhythm: Normal rate and regular rhythm.     Pulses: Normal pulses.     Heart sounds: Normal heart sounds.  Pulmonary:     Effort: Pulmonary effort is normal.     Breath sounds: Normal breath sounds.  Chest:     Comments: Left breast s/p lumpectomy and radiation, no sign of local recurrence, right breast benign Abdominal:     General: Abdomen is flat. Bowel sounds are normal. There is  no distension.     Palpations: Abdomen is soft.     Tenderness: There is no abdominal tenderness.  Musculoskeletal:        General: No swelling.     Cervical back: Neck supple.  Lymphadenopathy:     Cervical: No cervical adenopathy.     Upper Body:     Right upper body: No supraclavicular or axillary adenopathy.     Left upper body: No supraclavicular or axillary adenopathy.  Skin:    General: Skin is warm and dry.     Findings: No rash.  Neurological:     General: No focal deficit present.     Mental Status: She is alert.  Psychiatric:        Mood and Affect: Mood normal.        Behavior: Behavior normal.       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 (HCC) 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  06/06/2022:Left breast lumpectomy: IDC, 1.6 cm, grade 2, margins negative, 1/4 LN + carcinoma  06/06/2022: Oncotype: Recurrence score 3 (risk of distant recurrence 2%) Patient decided not to dissipate in OFFSET clinical trial or chemo with TC and decided on OFS with AI   Adjuvant XRT to be done in [place]   Treatment Plan: Adjuvant anti-estrogen therapy with Zoladex  with anastrozole  started 09/04/22 Return to clinic monthly for injections 12/11/2022: Bone density: T-score -1.1: Mild osteopenia: Calcium vitamin D with weightbearing exercises Anastrozole  toxicities: Severe joint  pains: Improved after stopping anastrozole . Therefore we switched her to letrozole  on 05/01/2023. Letrozole  toxicities: Arthralgias, Diclofenac gel prescribed Guardant reveal testing every 6 months order  No clinical or radiographic sign of breast cancer recurrence.  Will continue with annual mammogram and breast MRI as patient was less than 49 when she was diagnosed with breast cancer.    DEXA due in 12/2024.     Next steps: Zoladex  every 4 weeks Repeat labs in about 5 months Follow up with Dr. Gudena in about 6 months Guardant reveal testing in 03/2024 (every 6 months) Mammogram in 04/2024 Breast MRI in 10/2024 DEXA due in 12/2024 See Morna in about 12 months before injection   All questions were answered. The patient knows to call the clinic with any problems, questions or concerns. We can certainly see the patient much sooner if necessary.  Total encounter time:30 minutes*in face-to-face visit time, chart review, lab review, care coordination, order entry, and documentation of the encounter time.    Morna Kendall, NP 11/01/23 10:18 PM Medical Oncology and Hematology [facility] [address] [place], [place] [address] Tel. [phone]    Fax. [phone]  *Total Encounter Time as defined by the Centers for Medicare and Medicaid Services includes, in addition to the face-to-face time of a patient visit (documented in the note above) non-face-to-face time: obtaining and reviewing outside history, ordering and reviewing medications, tests or procedures, care coordination (communications with other health care professionals or caregivers) and documentation in the medical record.

## 2023-11-01 ENCOUNTER — Encounter: Payer: Self-pay | Admitting: Hematology and Oncology

## 2023-11-01 ENCOUNTER — Encounter: Payer: Self-pay | Admitting: Adult Health

## 2023-11-01 NOTE — Assessment & Plan Note (Signed)
 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  06/06/2022:Left breast lumpectomy: IDC, 1.6 cm, grade 2, margins negative, 1/4 LN + carcinoma  06/06/2022: Oncotype: Recurrence score 3 (risk of distant recurrence 2%) Patient decided not to dissipate in OFFSET clinical trial or chemo with TC and decided on OFS with AI   Adjuvant XRT to be done in [place]   Treatment Plan: Adjuvant anti-estrogen therapy with Zoladex  with anastrozole  started 09/04/22 Return to clinic monthly for injections 12/11/2022: Bone density: T-score -1.1: Mild osteopenia: Calcium vitamin D with weightbearing exercises Anastrozole  toxicities: Severe joint pains: Improved after stopping anastrozole . Therefore we switched her to letrozole  on 05/01/2023. Letrozole  toxicities: Arthralgias, Diclofenac gel prescribed Guardant reveal testing every 6 months order  No clinical or radiographic sign of breast cancer recurrence.  Will continue with annual mammogram and breast MRI as patient was less than 49 when she was diagnosed with breast cancer.    DEXA due in 12/2024.

## 2023-11-03 ENCOUNTER — Telehealth: Payer: Self-pay

## 2023-11-03 NOTE — Telephone Encounter (Signed)
 Oral Oncology Patient Advocate Encounter   Received notification that prior authorization for Diclofenac Sodium 3 % GEL  is required.   PA submitted on 11/03/2023 Key [id number] Status is pending      Charlott Hamilton,  CPhT-Adv  she/her/hers [facility]  [facility] Pharmacy Technician Patient Advocate Specialist III [facility] Phone: [phone]  Fax: [phone] [email or web address]

## 2023-11-04 NOTE — Telephone Encounter (Signed)
 Oral Oncology Patient Advocate Encounter  Received notification that the request for prior authorization for Diclofenac Sodium 3 % GEL  has been denied due to Drug is only covered for actinic keratosis; patient's condition does not qualify, requiring additional approval criteria.  Via [facility] prescription coverage.      Charlott Hamilton,  CPhT-Adv  she/her/hers [facility]  [facility] Pharmacy Technician Patient Advocate Specialist III [facility] Phone: [phone]  Fax: [phone] [email or web address]

## 2023-11-09 ENCOUNTER — Encounter: Payer: Self-pay | Admitting: Radiology

## 2023-11-17 ENCOUNTER — Telehealth: Payer: Self-pay | Admitting: Pharmacist

## 2023-11-17 NOTE — Telephone Encounter (Signed)
 Per insurance, the patient's plan will only cover Diclofenac Sodium 3% gel when used for the treatment of actinic keratosis. This does not appear to be the patient's current diagnosis or indication for use. Therefore, an appeal would not be successful.

## 2023-11-24 ENCOUNTER — Other Ambulatory Visit: Payer: Self-pay | Admitting: Sports Medicine

## 2023-11-25 ENCOUNTER — Inpatient Hospital Stay: Attending: Hematology and Oncology

## 2023-11-25 VITALS — BP 122/80 | HR 77 | Temp 98.5°F | Resp 17

## 2023-11-25 DIAGNOSIS — Z5111 Encounter for antineoplastic chemotherapy: Secondary | ICD-10-CM | POA: Diagnosis not present

## 2023-11-25 DIAGNOSIS — C50512 Malignant neoplasm of lower-outer quadrant of left female breast: Secondary | ICD-10-CM | POA: Insufficient documentation

## 2023-11-25 DIAGNOSIS — Z170 Estrogen receptor positive status [ER+]: Secondary | ICD-10-CM

## 2023-11-25 MED ORDER — GOSERELIN ACETATE 3.6 MG ~~LOC~~ IMPL
3.6000 mg | DRUG_IMPLANT | Freq: Once | SUBCUTANEOUS | Status: AC
  Administered 2023-11-25: 3.6 mg via SUBCUTANEOUS
  Filled 2023-11-25: qty 3.6

## 2023-12-23 ENCOUNTER — Other Ambulatory Visit: Payer: Self-pay | Admitting: Hematology and Oncology

## 2023-12-23 ENCOUNTER — Inpatient Hospital Stay: Attending: Hematology and Oncology

## 2023-12-23 VITALS — BP 120/83 | HR 63 | Temp 98.2°F | Resp 17

## 2023-12-23 DIAGNOSIS — Z170 Estrogen receptor positive status [ER+]: Secondary | ICD-10-CM

## 2023-12-23 DIAGNOSIS — Z5111 Encounter for antineoplastic chemotherapy: Secondary | ICD-10-CM | POA: Diagnosis not present

## 2023-12-23 DIAGNOSIS — C50512 Malignant neoplasm of lower-outer quadrant of left female breast: Secondary | ICD-10-CM | POA: Insufficient documentation

## 2023-12-23 MED ORDER — GOSERELIN ACETATE 3.6 MG ~~LOC~~ IMPL
3.6000 mg | DRUG_IMPLANT | Freq: Once | SUBCUTANEOUS | Status: AC
  Administered 2023-12-23: 16:00:00 3.6 mg via SUBCUTANEOUS
  Filled 2023-12-23: qty 3.6

## 2024-01-11 ENCOUNTER — Encounter: Payer: Self-pay | Admitting: Family Medicine

## 2024-01-11 ENCOUNTER — Ambulatory Visit: Payer: Self-pay

## 2024-01-11 ENCOUNTER — Telehealth (INDEPENDENT_AMBULATORY_CARE_PROVIDER_SITE_OTHER): Payer: Self-pay | Admitting: Family Medicine

## 2024-01-11 ENCOUNTER — Other Ambulatory Visit: Payer: Self-pay | Admitting: Family Medicine

## 2024-01-11 DIAGNOSIS — J4521 Mild intermittent asthma with (acute) exacerbation: Secondary | ICD-10-CM

## 2024-01-11 MED ORDER — PROMETHAZINE-DM 6.25-15 MG/5ML PO SYRP: 5.0000 mL | ORAL_SOLUTION | Freq: Four times a day (QID) | ORAL | 0 refills | Status: AC | PRN

## 2024-01-11 MED ORDER — ALBUTEROL SULFATE (2.5 MG/3ML) 0.083% IN NEBU: 2.5000 mg | INHALATION_SOLUTION | Freq: Four times a day (QID) | RESPIRATORY_TRACT | 1 refills | Status: AC | PRN

## 2024-01-11 MED ORDER — PREDNISONE 20 MG PO TABS: 40.0000 mg | ORAL_TABLET | Freq: Every day | ORAL | 0 refills | Status: AC

## 2024-01-11 NOTE — Telephone Encounter (Signed)
 FYI Only or Action Required?: Action required by provider: request for appointment.  Patient was last seen in primary care on 03/26/2023 by Melvenia Manus BRAVO, MD.  Called Nurse Triage reporting Cough.  Symptoms began several days ago.  Interventions attempted: Rest, hydration, or home remedies.  Symptoms are: unchanged.Productive cough, has asthma, using inhaler. No in patient appointments available, video visit made.  Triage Disposition: See HCP Within 4 Hours (Or PCP Triage)  Patient/caregiver understands and will follow disposition?: Yes      Copied from CRM [id number]. Topic: Clinical - Red Word Triage >> Jan 11, 2024  8:29 AM Antwanette L wrote: Red Word that prompted transfer to Nurse Triage: On Friday, the patient began feeling unwell and developed a cough producing yellow and clear mucus. On Saturday, the patient experienced difficulty breathing. The patient is requesting a breathing treatment. Reason for Disposition  [1] MILD difficulty breathing (e.g., minimal/no SOB at rest, SOB with walking, pulse < 100) AND [2] still present when not coughing  Answer Assessment - Initial Assessment Questions 1. ONSET: When did the cough begin?      Friday 2. SEVERITY: How bad is the cough today?      moderate 3. SPUTUM: Describe the color of your sputum (e.g., none, dry cough; clear, white, yellow, green)     yellow 4. HEMOPTYSIS: Are you coughing up any blood? If Yes, ask: How much? (e.g., flecks, streaks, tablespoons, etc.)     no 5. DIFFICULTY BREATHING: Are you having difficulty breathing? If Yes, ask: How bad is it? (e.g., mild, moderate, severe)      mild 6. FEVER: Do you have a fever? If Yes, ask: What is your temperature, how was it measured, and when did it start?     no 7. CARDIAC HISTORY: Do you have any history of heart disease? (e.g., heart attack, congestive heart failure)      no 8. LUNG HISTORY: Do you have any history of lung disease?  (e.g.,  pulmonary embolus, asthma, emphysema)     asthma 9. PE RISK FACTORS: Do you have a history of blood clots? (or: recent major surgery, recent prolonged travel, bedridden)     no 10. OTHER SYMPTOMS: Do you have any other symptoms? (e.g., runny nose, wheezing, chest pain)       wheezing 11. PREGNANCY: Is there any chance you are pregnant? When was your last menstrual period?       no 12. TRAVEL: Have you traveled out of the country in the last month? (e.g., travel history, exposures)       no  Protocols used: Cough - Acute Productive-A-AH

## 2024-01-11 NOTE — Telephone Encounter (Signed)
Noted patient scheduled

## 2024-01-11 NOTE — Telephone Encounter (Signed)
"  Rx sent  "

## 2024-01-11 NOTE — Progress Notes (Signed)
 "  Virtual Visit via Video Note  I connected with Mariah Burnett on 01/11/2024 at 11:00 AM EST by a video enabled telemedicine application and verified that I am speaking with the correct person using two identifiers.  Patient Location: Home Provider Location: Home Office  I discussed the limitations, risks, security, and privacy concerns of performing an evaluation and management service by video and the availability of in person appointments. I also discussed with the patient that there may be a patient responsible charge related to this service. The patient expressed understanding and agreed to proceed.  Subjective: PCP: Mallipeddi, Vishnu P, MD  Chief Complaint  Patient presents with   Cough    Coughing and wheezing, tightness in chest , wants to get a nebulizer    HPI  The patient presents today with complaints of coughing, wheezing, and chest tightness. Symptoms began on Friday (01/09/2024) afternoon with cough and sore throat. She initially took cough medication and Tylenol , but the cough became productive, accompanied by burning chest tightness and runny nose. On Sunday, symptoms persisted; she primarily rested and slept, using a humidifier, which she reports helped. She experienced mild body aches that improved with Tylenol . She denies systemic symptoms such as fever, chills, or shortness of breath beyond the chest tightness and wheezing.  ROS: Per HPI Current Medications[1]  Observations/Objective: There were no vitals filed for this visit. Physical Exam Patient is well-developed, well-nourished in no acute distress.  Resting comfortably at home.  Head is normocephalic, atraumatic.  No labored breathing.  Speech is clear and coherent with logical content.  Patient is alert and oriented at baseline.   Assessment and Plan: Mild intermittent asthma with exacerbation Assessment & Plan: Will treat today for an asthma exacerbation. Encouraged to continue and complete the full  course of prescribed medications and to follow up if symptoms worsen or fail to improve.   Orders: -     predniSONE ; Take 2 tablets (40 mg total) by mouth daily for 5 days.  Dispense: 10 tablet; Refill: 0 -     For home use only DME Nebulizer machine -     Albuterol  Sulfate; Take 3 mLs (2.5 mg total) by nebulization every 6 (six) hours as needed for wheezing or shortness of breath.  Dispense: 150 mL; Refill: 1 -     Promethazine -DM; Take 5 mLs by mouth 4 (four) times daily as needed.  Dispense: 118 mL; Refill: 0  Note: This chart has been completed using Engineer, Civil (consulting) software, and while attempts have been made to ensure accuracy, certain words and phrases may not be transcribed as intended.    Follow Up Instructions: No follow-ups on file.   I discussed the assessment and treatment plan with the patient. The patient was provided an opportunity to ask questions, and all were answered. The patient agreed with the plan and demonstrated an understanding of the instructions.   The patient was advised to call back or seek an in-person evaluation if the symptoms worsen or if the condition fails to improve as anticipated.  The above assessment and management plan was discussed with the patient. The patient verbalized understanding of and has agreed to the management plan.   Ileah Falkenstein  Z Bacchus, FNP     [1]  Current Outpatient Medications:    albuterol  (PROVENTIL ) (2.5 MG/3ML) 0.083% nebulizer solution, Take 3 mLs (2.5 mg total) by nebulization every 6 (six) hours as needed for wheezing or shortness of breath., Disp: 150 mL, Rfl: 1   predniSONE  (  DELTASONE ) 20 MG tablet, Take 2 tablets (40 mg total) by mouth daily for 5 days., Disp: 10 tablet, Rfl: 0   promethazine -dextromethorphan (PROMETHAZINE -DM) 6.25-15 MG/5ML syrup, Take 5 mLs by mouth 4 (four) times daily as needed., Disp: 118 mL, Rfl: 0   acyclovir  (ZOVIRAX ) 200 MG capsule, Take 2 capsules (400 mg total) by mouth 3 (three) times  daily., Disp: 90 capsule, Rfl: 3   azelastine  (ASTELIN ) 0.1 % nasal spray, Place 2 sprays into both nostrils 2 (two) times daily as needed. Use in each nostril as directed, Disp: 30 mL, Rfl: 5   Diclofenac  Sodium 3 % GEL, Apply 1 Application topically 2 (two) times daily as needed., Disp: 300 g, Rfl: 0   gabapentin  (NEURONTIN ) 100 MG capsule, Take 100 mg by mouth 3 (three) times daily., Disp: , Rfl:    hydrOXYzine  (ATARAX ) 25 MG tablet, Take 1 tablet (25 mg total) by mouth every 8 (eight) hours as needed., Disp: 30 tablet, Rfl: 2   letrozole  (FEMARA ) 2.5 MG tablet, TAKE 1 TABLET DAILY, Disp: 90 tablet, Rfl: 1   levalbuterol (XOPENEX HFA) 45 MCG/ACT inhaler, Inhale 1 puff into the lungs every 6 (six) hours as needed for wheezing., Disp: 15 g, Rfl: 2   levalbuterol (XOPENEX HFA) 45 MCG/ACT inhaler, USE 2 INHALATIONS ORALLY   EVERY 6 HOURS AS NEEDED FORWHEEZING OR SHORTNESS OF   BREATH, Disp: 15 g, Rfl: 1   levocetirizine (XYZAL ) 5 MG tablet, TAKE 1 TABLET EVERY EVENING, Disp: 90 tablet, Rfl: 1   meloxicam  (MOBIC ) 15 MG tablet, TAKE 1 TABLET DAILY, Disp: 30 tablet, Rfl: 1   metoprolol  tartrate (LOPRESSOR ) 25 MG tablet, Take 0.5 tablets (12.5 mg total) by mouth 2 (two) times daily., Disp: 90 tablet, Rfl: 3   UNABLE TO FIND, Zolodex injection-monthly, Disp: , Rfl:    venlafaxine  XR (EFFEXOR -XR) 37.5 MG 24 hr capsule, TAKE 1 CAPSULE BY MOUTH DAILY WITH BREAKFAST., Disp: 90 capsule, Rfl: 1  "

## 2024-01-11 NOTE — Assessment & Plan Note (Signed)
 Will treat today for an asthma exacerbation. Encouraged to continue and complete the full course of prescribed medications and to follow up if symptoms worsen or fail to improve.

## 2024-01-20 ENCOUNTER — Inpatient Hospital Stay: Attending: Hematology and Oncology

## 2024-01-20 ENCOUNTER — Encounter: Payer: Self-pay | Admitting: Hematology and Oncology

## 2024-01-20 VITALS — BP 117/83 | HR 81 | Resp 17

## 2024-01-20 DIAGNOSIS — Z5111 Encounter for antineoplastic chemotherapy: Secondary | ICD-10-CM | POA: Insufficient documentation

## 2024-01-20 DIAGNOSIS — C50512 Malignant neoplasm of lower-outer quadrant of left female breast: Secondary | ICD-10-CM | POA: Insufficient documentation

## 2024-01-20 DIAGNOSIS — Z170 Estrogen receptor positive status [ER+]: Secondary | ICD-10-CM

## 2024-01-20 MED ORDER — GOSERELIN ACETATE 3.6 MG ~~LOC~~ IMPL
3.6000 mg | DRUG_IMPLANT | Freq: Once | SUBCUTANEOUS | Status: AC
  Administered 2024-01-20: 3.6 mg via SUBCUTANEOUS
  Filled 2024-01-20: qty 3.6

## 2024-01-21 ENCOUNTER — Encounter: Payer: Self-pay | Admitting: Hematology and Oncology

## 2024-02-02 ENCOUNTER — Ambulatory Visit: Admitting: Family Medicine

## 2024-02-10 ENCOUNTER — Encounter: Payer: Self-pay | Admitting: Hematology and Oncology

## 2024-02-17 ENCOUNTER — Inpatient Hospital Stay

## 2024-02-18 ENCOUNTER — Encounter: Admitting: Family Medicine

## 2024-02-18 ENCOUNTER — Inpatient Hospital Stay: Attending: Hematology and Oncology | Admitting: Adult Health

## 2024-02-18 ENCOUNTER — Inpatient Hospital Stay

## 2024-03-01 ENCOUNTER — Ambulatory Visit: Admitting: Obstetrics & Gynecology

## 2024-03-07 ENCOUNTER — Ambulatory Visit: Admitting: Internal Medicine

## 2024-03-09 ENCOUNTER — Ambulatory Visit: Admitting: Family Medicine

## 2024-03-16 ENCOUNTER — Inpatient Hospital Stay

## 2024-03-28 ENCOUNTER — Encounter

## 2024-04-13 ENCOUNTER — Inpatient Hospital Stay: Admitting: Hematology and Oncology

## 2024-04-13 ENCOUNTER — Inpatient Hospital Stay

## 2024-10-12 ENCOUNTER — Encounter

## 2024-10-15 ENCOUNTER — Other Ambulatory Visit
